# Patient Record
Sex: Female | Born: 1955
Health system: Southern US, Community
[De-identification: ages and names within clinical notes are randomized; demographics above are authoritative.]

## PROBLEM LIST (undated history)

## (undated) DIAGNOSIS — M8580 Other specified disorders of bone density and structure, unspecified site: Secondary | ICD-10-CM

## (undated) DIAGNOSIS — E288 Other ovarian dysfunction: Secondary | ICD-10-CM

## (undated) DIAGNOSIS — E079 Disorder of thyroid, unspecified: Secondary | ICD-10-CM

## (undated) DIAGNOSIS — B009 Herpesviral infection, unspecified: Secondary | ICD-10-CM

## (undated) DIAGNOSIS — I776 Arteritis, unspecified: Secondary | ICD-10-CM

## (undated) DIAGNOSIS — E2839 Other primary ovarian failure: Secondary | ICD-10-CM

## (undated) DIAGNOSIS — M3500 Sicca syndrome, unspecified: Secondary | ICD-10-CM

## (undated) HISTORY — DX: Other specified disorders of bone density and structure, unspecified site: M85.80

## (undated) HISTORY — PX: TUBAL LIGATION: SHX77

## (undated) HISTORY — DX: Other ovarian dysfunction: E28.8

## (undated) HISTORY — DX: Herpesviral infection, unspecified: B00.9

## (undated) HISTORY — DX: Disorder of thyroid, unspecified: E07.9

## (undated) HISTORY — DX: Arteritis, unspecified: I77.6

## (undated) HISTORY — DX: Sjogren syndrome, unspecified: M35.00

## (undated) HISTORY — DX: Other primary ovarian failure: E28.39

---

## 2000-03-13 ENCOUNTER — Encounter: Admission: RE | Admit: 2000-03-13 | Discharge: 2000-03-13 | Payer: Self-pay | Admitting: Gynecology

## 2000-03-13 ENCOUNTER — Encounter: Payer: Self-pay | Admitting: Gynecology

## 2000-09-14 ENCOUNTER — Encounter: Admission: RE | Admit: 2000-09-14 | Discharge: 2000-09-14 | Payer: Self-pay | Admitting: Gynecology

## 2000-09-14 ENCOUNTER — Encounter: Payer: Self-pay | Admitting: Gynecology

## 2001-05-17 ENCOUNTER — Encounter: Payer: Self-pay | Admitting: Gynecology

## 2001-05-17 ENCOUNTER — Encounter: Admission: RE | Admit: 2001-05-17 | Discharge: 2001-05-17 | Payer: Self-pay | Admitting: Gynecology

## 2001-05-27 ENCOUNTER — Other Ambulatory Visit: Admission: RE | Admit: 2001-05-27 | Discharge: 2001-05-27 | Payer: Self-pay | Admitting: Gynecology

## 2002-06-04 ENCOUNTER — Other Ambulatory Visit: Admission: RE | Admit: 2002-06-04 | Discharge: 2002-06-04 | Payer: Self-pay | Admitting: Gynecology

## 2003-06-10 ENCOUNTER — Other Ambulatory Visit: Admission: RE | Admit: 2003-06-10 | Discharge: 2003-06-10 | Payer: Self-pay | Admitting: Gynecology

## 2003-07-21 ENCOUNTER — Ambulatory Visit (HOSPITAL_COMMUNITY): Admission: RE | Admit: 2003-07-21 | Discharge: 2003-07-21 | Payer: Self-pay | Admitting: Rheumatology

## 2004-07-07 ENCOUNTER — Other Ambulatory Visit: Admission: RE | Admit: 2004-07-07 | Discharge: 2004-07-07 | Payer: Self-pay | Admitting: Gynecology

## 2005-08-17 ENCOUNTER — Other Ambulatory Visit: Admission: RE | Admit: 2005-08-17 | Discharge: 2005-08-17 | Payer: Self-pay | Admitting: Gynecology

## 2006-08-22 ENCOUNTER — Other Ambulatory Visit: Admission: RE | Admit: 2006-08-22 | Discharge: 2006-08-22 | Payer: Self-pay | Admitting: Gynecology

## 2006-08-23 ENCOUNTER — Encounter: Admission: RE | Admit: 2006-08-23 | Discharge: 2006-08-23 | Payer: Self-pay | Admitting: Gynecology

## 2007-02-21 ENCOUNTER — Ambulatory Visit (HOSPITAL_COMMUNITY): Admission: RE | Admit: 2007-02-21 | Discharge: 2007-02-21 | Payer: Self-pay | Admitting: Gastroenterology

## 2009-04-21 ENCOUNTER — Encounter: Payer: Self-pay | Admitting: Rheumatology

## 2010-05-24 NOTE — Op Note (Signed)
NAMEBRITNE, Jennifer Barr                 ACCOUNT NO.:  [id number]   MEDICAL RECORD NO.:  [id number]          PATIENT TYPE:  AMB   LOCATION:  ENDO                         FACILITY:  [facility]   PHYSICIAN:  Bernette Redbird, M.D.   DATE OF BIRTH:  07/26/1955   DATE OF PROCEDURE:  02/21/2007  DATE OF DISCHARGE:                               OPERATIVE REPORT   PROCEDURE:  Colonoscopy.   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   FINDINGS:  Normal exam to the terminal ileum.   PROCEDURE:  The procedure had been discussed with the patient through  our open access program and the purpose and risks of the procedure were  reviewed with the patient by me immediately prior to her exam, with her  having provided written consent.  Sedation was fentanyl 100 mcg and  Versed 10 mg IV prior to and during the course the procedure without  arrhythmias or desaturation.   The Pentax pediatric video colonoscope was readily advanced to the  terminal ileum and pullback was then performed. The quality of the prep  was excellent and it is felt that all areas were well seen.   The TI looked normal as did the remainder of the colon.  No polyps,  masses, diverticular disease, colitis or vascular ectasia were observed  and retroflexion in the rectum was unremarkable.  No biopsies were  obtained.  She tolerated the procedure well and there were no apparent  complications.   IMPRESSION:  Normal colonoscopy.   PLAN:  Repeat colonoscopy in 10 years or sooner if risk factors develop.           ______________________________  Bernette Redbird, M.D.     RB/MEDQ  D:  02/21/2007  T:  02/22/2007  Job:  [id number]   cc:   Gretta Cool, M.D.  Fax: [phone]

## 2010-10-03 ENCOUNTER — Ambulatory Visit
Admission: RE | Admit: 2010-10-03 | Discharge: 2010-10-03 | Disposition: A | Discharge status: Home / Self Care | Payer: Self-pay | Source: Ambulatory Visit | Attending: Gynecology | Admitting: Gynecology

## 2010-10-03 ENCOUNTER — Other Ambulatory Visit: Payer: Self-pay | Admitting: Gynecology

## 2010-10-03 DIAGNOSIS — Z1231 Encounter for screening mammogram for malignant neoplasm of breast: Secondary | ICD-10-CM

## 2013-01-07 ENCOUNTER — Encounter: Payer: Self-pay | Admitting: Family Medicine

## 2013-01-07 ENCOUNTER — Ambulatory Visit (INDEPENDENT_AMBULATORY_CARE_PROVIDER_SITE_OTHER): Payer: 59 | Admitting: Family Medicine

## 2013-01-07 VITALS — BP 110/70 | HR 81 | Temp 98.7°F | Resp 18 | Ht 68.5 in | Wt 156.0 lb

## 2013-01-07 DIAGNOSIS — J040 Acute laryngitis: Secondary | ICD-10-CM

## 2013-01-07 DIAGNOSIS — H109 Unspecified conjunctivitis: Secondary | ICD-10-CM

## 2013-01-07 DIAGNOSIS — J40 Bronchitis, not specified as acute or chronic: Secondary | ICD-10-CM

## 2013-01-07 DIAGNOSIS — D230 Other benign neoplasm of skin of lip: Secondary | ICD-10-CM

## 2013-01-07 DIAGNOSIS — J019 Acute sinusitis, unspecified: Secondary | ICD-10-CM

## 2013-01-07 DIAGNOSIS — D220 Melanocytic nevi of lip: Secondary | ICD-10-CM

## 2013-01-07 MED ORDER — GUAIFENESIN-CODEINE 100-10 MG/5ML PO SOLN: 5.0000 mL | Freq: Four times a day (QID) | ORAL | Status: DC | PRN

## 2013-01-07 MED ORDER — POLYMYXIN B-TRIMETHOPRIM 10000-0.1 UNIT/ML-% OP SOLN: 1.0000 [drp] | Freq: Four times a day (QID) | OPHTHALMIC | Status: DC

## 2013-01-07 MED ORDER — AMOXICILLIN 500 MG PO CAPS: 500.0000 mg | ORAL_CAPSULE | Freq: Three times a day (TID) | ORAL | Status: DC

## 2013-01-07 NOTE — Assessment & Plan Note (Signed)
She has laryngitis secondary to the sinusitis and drainage. Supportive care for the laryngitis.

## 2013-01-07 NOTE — Assessment & Plan Note (Signed)
Robitussin with codeine as well as antibiotic

## 2013-01-07 NOTE — Assessment & Plan Note (Signed)
Due to her stroke in send autoimmune disorder she is describing more infectious process in the eyes. I will cover her with Polytrim

## 2013-01-07 NOTE — Patient Instructions (Signed)
Use eye drop as directed Start antibiotics Push fluids Take cough medicine as prescribed Antibiotic ointment for nose Referral to dermatology for lip mole Release of records- Dr. Corliss Skains F/U as needed

## 2013-01-07 NOTE — Assessment & Plan Note (Addendum)
Antibiotics, nasal saline. She can apply bacitracin to the nasal sore  Do to her on an immune state her illness has been prolonged period I will he could fill out FMLA paperwork this is needed for her job.

## 2013-01-07 NOTE — Assessment & Plan Note (Signed)
Referral to dermatology to have this removed.

## 2013-01-07 NOTE — Progress Notes (Signed)
   Subjective:    Patient ID: Jennifer Barr, female    DOB: [birth year], 56 y.o.   MRN: [id number]  HPI Patient here with cough with minimal production sinus drainage ear pressure and laryngitis for the past 10 days. She is history of autoimmune disease including  sjrogen's and vasculitis. She also has history of Hashimoto thyroiditis. She is on immunosuppressive agents. She's had some fever with the above symptoms which has been worsening over the past week and a half. She has been using over-the-counter Robitussin and NyQuil with minimal relief.  The past 3 days she has had a yellow discharge from both eyes throughout the day. She also gets crusting. She does use artificial tears on a regular basis because of her dry eye.  She's also had some leaking with continuous cough over the past week. She recently had a GYN exam and was not told that she had a bladder prolapse. She also coughed and felt like she pulled a muscle on her left lower back. She's been using a heating pad and taking Aleve as needed.  She also noted a mole that pop up on her lower lip over the past year it has been changing some and she would like to have this removed    Review of Systems   GEN- denies fatigue, fever, weight loss,weakness, recent illness HEENT- denies eye drainage, change in vision,+ nasal discharge, CVS- denies chest pain, palpitations RESP- denies SOB, +cough, wheeze ABD- denies N/V, change in stools, abd pain GU- denies dysuria, hematuria, dribbling, incontinence MSK- + joint pain, muscle aches, injury Neuro- denies headache, dizziness, syncope, seizure activity      Objective:   Physical Exam  GEN- NAD, alert and oriented x3, hoarse voice, non toxic appearing HEENT- PERRL, EOMI, non injected sclera, injected left  conjunctiva, MMM, oropharynx mild injection, TM clear bilat no effusion,  + mild maxillary sinus tenderness, + Nasal drainage  Neck- Supple, shotty LAD CVS- RRR, no murmur RESP-CTAB EXT-  No edema Pulses- Radial 2+ Skin- Lower lip- brown macule center of lip, left nares- small sore, with mild erythema        Assessment & Plan:

## 2014-02-06 ENCOUNTER — Other Ambulatory Visit: Payer: Self-pay

## 2014-02-06 ENCOUNTER — Ambulatory Visit
Admission: RE | Admit: 2014-02-06 | Discharge: 2014-02-06 | Disposition: A | Discharge status: Home / Self Care | Payer: Commercial Managed Care - PPO | Source: Ambulatory Visit

## 2014-02-06 DIAGNOSIS — Z1231 Encounter for screening mammogram for malignant neoplasm of breast: Secondary | ICD-10-CM

## 2014-05-07 ENCOUNTER — Ambulatory Visit (INDEPENDENT_AMBULATORY_CARE_PROVIDER_SITE_OTHER): Payer: 59 | Admitting: Physician Assistant

## 2014-05-07 ENCOUNTER — Encounter: Payer: Self-pay | Admitting: Physician Assistant

## 2014-05-07 VITALS — BP 98/60 | HR 82 | Temp 97.6°F | Resp 20 | Ht 68.5 in | Wt 152.0 lb

## 2014-05-07 DIAGNOSIS — R05 Cough: Secondary | ICD-10-CM

## 2014-05-07 DIAGNOSIS — M3500 Sicca syndrome, unspecified: Secondary | ICD-10-CM

## 2014-05-07 DIAGNOSIS — R5081 Fever presenting with conditions classified elsewhere: Secondary | ICD-10-CM | POA: Diagnosis not present

## 2014-05-07 DIAGNOSIS — E063 Autoimmune thyroiditis: Secondary | ICD-10-CM | POA: Diagnosis not present

## 2014-05-07 DIAGNOSIS — R059 Cough, unspecified: Secondary | ICD-10-CM

## 2014-05-07 DIAGNOSIS — I776 Arteritis, unspecified: Secondary | ICD-10-CM | POA: Insufficient documentation

## 2014-05-07 LAB — COMPLETE METABOLIC PANEL WITH GFR
ALBUMIN: 4.2 g/dL (ref 3.5–5.2)
ALT: 12 U/L (ref 0–35)
AST: 54 U/L — ABNORMAL HIGH (ref 0–37)
Alkaline Phosphatase: 58 U/L (ref 39–117)
BUN: 12 mg/dL (ref 6–23)
CHLORIDE: 93 meq/L — AB (ref 96–112)
CO2: 24 mEq/L (ref 19–32)
Calcium: 8.8 mg/dL (ref 8.4–10.5)
Creat: 0.97 mg/dL (ref 0.50–1.10)
GFR, Est African American: 74 mL/min
GFR, Est Non African American: 65 mL/min
GLUCOSE: 99 mg/dL (ref 70–99)
POTASSIUM: 4.2 meq/L (ref 3.5–5.3)
Sodium: 132 mEq/L — ABNORMAL LOW (ref 135–145)
TOTAL PROTEIN: 7.8 g/dL (ref 6.0–8.3)
Total Bilirubin: 0.6 mg/dL (ref 0.2–1.2)

## 2014-05-07 LAB — CBC WITH DIFFERENTIAL/PLATELET
BASOS PCT: 0 % (ref 0–1)
Basophils Absolute: 0 10*3/uL (ref 0.0–0.1)
EOS PCT: 0 % (ref 0–5)
Eosinophils Absolute: 0 10*3/uL (ref 0.0–0.7)
HEMATOCRIT: 40.8 % (ref 36.0–46.0)
Hemoglobin: 13.9 g/dL (ref 12.0–15.0)
LYMPHS PCT: 13 % (ref 12–46)
Lymphs Abs: 0.5 10*3/uL — ABNORMAL LOW (ref 0.7–4.0)
MCH: 29 pg (ref 26.0–34.0)
MCHC: 34.1 g/dL (ref 30.0–36.0)
MCV: 85.2 fL (ref 78.0–100.0)
MONO ABS: 0.4 10*3/uL (ref 0.1–1.0)
MONOS PCT: 10 % (ref 3–12)
MPV: 9.6 fL (ref 8.6–12.4)
NEUTROS ABS: 3.2 10*3/uL (ref 1.7–7.7)
Neutrophils Relative %: 77 % (ref 43–77)
Platelets: 222 10*3/uL (ref 150–400)
RBC: 4.79 MIL/uL (ref 3.87–5.11)
RDW: 13.5 % (ref 11.5–15.5)
WBC: 4.2 10*3/uL (ref 4.0–10.5)

## 2014-05-07 MED ORDER — AZITHROMYCIN 250 MG PO TABS: ORAL_TABLET | ORAL | Status: DC

## 2014-05-07 NOTE — Progress Notes (Signed)
Patient ID: GWENDALYN MCGONAGLE MRN: [id number], DOB: December 04, 1955, 59 y.o. Date of Encounter: @DATE @  Chief Complaint:  Chief Complaint  Patient presents with  . OTHER    fever x 5day,cough, nauseated, no vomiting, has not been urinating much,fatique    HPI: 59 y.o. year old white female  presents with the above symptoms. She is accompanied by her daughter who is an Therapist, sports in the cardiac ICU at [employer].  Patient has significant history for Sjogren's syndrome, lower extremity vasculitis, and Hashimoto thyroiditis.  Daughter has pictures on her phone that she shows me from 04/25/14 of "vasculitis flare "on patient's legs. As well they state that she has been on current immunosuppressants and medications for 10 years. On Imuran and Plaquenil.  Pt states that she has had fever for 5 days. Says fever was 103 then 102 then 101 and then this morning fever was decreased/resolved. States that she has also had a congested productive cough through this same amount of time. States that she has had no congestion in her head and nose and is not blowing anything from her nose. No sore throat or ear ache. No abdominal pain nausea vomiting diarrhea.   Past Medical History  Diagnosis Date  . Sjoegren syndrome     Dr. Estanislado Pandy  . Vasculitis     Dr. Estanislado Pandy  . Thyroid disease     Hashiomto  . Premature ovarian failure      Home Meds: Outpatient Prescriptions Prior to Visit  Medication Sig Dispense Refill  . aspirin 81 MG tablet Take 81 mg by mouth daily.    Marland Kitchen azaTHIOprine (IMURAN) 50 MG tablet Take 50 mg by mouth daily.    . hydroxychloroquine (PLAQUENIL) 200 MG tablet Take by mouth daily.    Marland Kitchen levothyroxine (SYNTHROID, LEVOTHROID) 100 MCG tablet Take 100 mcg by mouth daily before breakfast.    . pilocarpine (SALAGEN) 5 MG tablet Take by mouth.    . Progesterone 200 MG CAPS Take by mouth. Take every 3rd month    . trimethoprim-polymyxin b (POLYTRIM) ophthalmic solution Place 1 drop into both eyes  every 6 (six) hours. FOR 5 DAYS 10 mL 0  . amoxicillin (AMOXIL) 500 MG capsule Take 1 capsule (500 mg total) by mouth 3 (three) times daily. (Patient not taking: Reported on 05/07/2014) 30 capsule 0  . guaiFENesin-codeine 100-10 MG/5ML syrup Take 5 mLs by mouth every 6 (six) hours as needed for cough. (Patient not taking: Reported on 05/07/2014) 180 mL 0   No facility-administered medications prior to visit.    Allergies:  Allergies  Allergen Reactions  . Sulfa Antibiotics     History   Social History  . Marital Status: Married    Spouse Name: N/A  . Number of Children: N/A  . Years of Education: N/A   Occupational History  . Not on file.   Social History Main Topics  . Smoking status: Never Smoker   . Smokeless tobacco: Not on file  . Alcohol Use: Not on file  . Drug Use: No  . Sexual Activity: Not on file   Other Topics Concern  . Not on file   Social History Narrative    Family History  Problem Relation Age of Onset  . Diabetes Mother   . Diabetes Father      Review of Systems:  See HPI for pertinent ROS. All other ROS negative.    Physical Exam: Blood pressure 98/60, pulse 82, temperature 97.6 F (36.4 C), temperature source Oral,  resp. rate 20, height 5' 8.5" (1.74 m), weight 152 lb (68.947 kg)., Body mass index is 22.77 kg/(m^2). General: WNWD WF. Appears in no acute distress. Head: Normocephalic, atraumatic, eyes without discharge, sclera non-icteric, nares are without discharge. Bilateral auditory canals clear, TM's are without perforation, pearly grey and translucent with reflective cone of light bilaterally. Oral cavity moist, posterior pharynx without exudate, erythema, peritonsillar abscess, or post nasal drip.  Neck: Supple. No thyromegaly. No lymphadenopathy. Lungs: Clear bilaterally to auscultation without wheezes, rales, or rhonchi. Breathing is unlabored. Heart: RRR with S1 S2. No murmurs, rubs, or gallops. Musculoskeletal:  Strength and tone  normal for age. No LE Edema. Extremities/Skin: Warm and dry. No edema.  Neuro: Alert and oriented X 3. Moves all extremities spontaneously. Gait is normal. CNII-XII grossly in tact. Psych:  Responds to questions appropriately with a normal affect.     ASSESSMENT AND PLAN:  59 y.o. year old female with  1. Fever presenting with conditions classified elsewhere - CBC with Differential/Platelet - azithromycin (ZITHROMAX) 250 MG tablet; Day 1: Take 2 daily. Days 2-5: Take 1 daily.  Dispense: 6 tablet; Refill: 0  2. Cough - CBC with Differential/Platelet - azithromycin (ZITHROMAX) 250 MG tablet; Day 1: Take 2 daily. Days 2-5: Take 1 daily.  Dispense: 6 tablet; Refill: 0  3. Sjoegren syndrome - CBC with Differential/Platelet  4. Vasculitis - CBC with Differential/Platelet - COMPLETE METABOLIC PANEL WITH GFR  5. Hashimoto's thyroiditis - CBC with Differential/Platelet  Reviewed with patient and daughter that I do not have copy of labs by Dr. Estanislado Pandy. Patient states that she knows that she has elevated LFTs and low WBC. Patient also states that she wants to check to make sure that the vasculitis flare did not affect her kidneys. Therefore will check above labs. Will treat with azithromycin as directed. Follow-up if symptoms worsen or do not resolve within 1 week of completion of antibiotic.  [address] [place], [place], [facility] 05/07/2014 9:44 AM

## 2015-01-14 MED FILL — HYDROXYCHLOROQUINE 200 MG T: 200 | 90 days supply | Qty: 90 | Fill #3

## 2015-01-14 MED FILL — PILOCARPINE HCL 5 MG TABLET: 5 | 90 days supply | Qty: 270 | Fill #1

## 2015-02-05 DIAGNOSIS — Z79899 Other long term (current) drug therapy: Secondary | ICD-10-CM | POA: Diagnosis not present

## 2015-02-18 DIAGNOSIS — M3509 Sicca syndrome with other organ involvement: Secondary | ICD-10-CM | POA: Diagnosis not present

## 2015-02-18 DIAGNOSIS — Z09 Encounter for follow-up examination after completed treatment for conditions other than malignant neoplasm: Secondary | ICD-10-CM | POA: Diagnosis not present

## 2015-02-18 DIAGNOSIS — R21 Rash and other nonspecific skin eruption: Secondary | ICD-10-CM | POA: Diagnosis not present

## 2015-02-18 DIAGNOSIS — M19241 Secondary osteoarthritis, right hand: Secondary | ICD-10-CM | POA: Diagnosis not present

## 2015-03-05 ENCOUNTER — Encounter: Payer: Self-pay | Admitting: Obstetrics & Gynecology

## 2015-03-05 DIAGNOSIS — Z0000 Encounter for general adult medical examination without abnormal findings: Secondary | ICD-10-CM | POA: Diagnosis not present

## 2015-03-05 DIAGNOSIS — Z1329 Encounter for screening for other suspected endocrine disorder: Secondary | ICD-10-CM | POA: Diagnosis not present

## 2015-03-05 DIAGNOSIS — Z1151 Encounter for screening for human papillomavirus (HPV): Secondary | ICD-10-CM | POA: Diagnosis not present

## 2015-03-05 DIAGNOSIS — Z13220 Encounter for screening for lipoid disorders: Secondary | ICD-10-CM | POA: Diagnosis not present

## 2015-03-05 DIAGNOSIS — Z1389 Encounter for screening for other disorder: Secondary | ICD-10-CM | POA: Diagnosis not present

## 2015-03-05 DIAGNOSIS — Z01419 Encounter for gynecological examination (general) (routine) without abnormal findings: Secondary | ICD-10-CM | POA: Diagnosis not present

## 2015-03-05 DIAGNOSIS — Z130 Encounter for screening for diseases of the blood and blood-forming organs and certain disorders involving the immune mechanism: Secondary | ICD-10-CM | POA: Diagnosis not present

## 2015-03-05 MED FILL — ESTRADIOL 0.025 MG PATCH: 0.025 | 84 days supply | Qty: 24 | Fill #0

## 2015-03-05 MED FILL — PROGESTERONE 200 MG CAPSULE: 200 | 90 days supply | Qty: 30 | Fill #0

## 2015-03-08 MED FILL — azaTHIOprine 50 MG TABS: 50 | 90 days supply | Qty: 135 | Fill #0

## 2015-03-12 DIAGNOSIS — H524 Presbyopia: Secondary | ICD-10-CM | POA: Diagnosis not present

## 2015-03-12 DIAGNOSIS — H5213 Myopia, bilateral: Secondary | ICD-10-CM | POA: Diagnosis not present

## 2015-03-12 DIAGNOSIS — Z79899 Other long term (current) drug therapy: Secondary | ICD-10-CM | POA: Diagnosis not present

## 2015-03-12 DIAGNOSIS — H52223 Regular astigmatism, bilateral: Secondary | ICD-10-CM | POA: Diagnosis not present

## 2015-03-12 MED FILL — SYNTHROID 100 MCG TABLET: 100 | 90 days supply | Qty: 90 | Fill #0

## 2015-04-05 MED FILL — HYDROXYCHLOROQUINE 200 MG T: 200 | 90 days supply | Qty: 90 | Fill #0

## 2015-05-06 DIAGNOSIS — Z79899 Other long term (current) drug therapy: Secondary | ICD-10-CM | POA: Diagnosis not present

## 2015-06-08 MED FILL — SYNTHROID 100 MCG TABLET: 100 | 90 days supply | Qty: 90 | Fill #1

## 2015-06-08 MED FILL — azaTHIOprine 50 MG TABS: 50 | 90 days supply | Qty: 135 | Fill #0

## 2015-06-10 DIAGNOSIS — Z79899 Other long term (current) drug therapy: Secondary | ICD-10-CM | POA: Diagnosis not present

## 2015-07-02 MED FILL — HYDROXYCHLOROQUINE 200 MG T: 200 | 90 days supply | Qty: 90 | Fill #1

## 2015-07-02 MED FILL — PROGESTERONE 200 MG CAPSULE: 200 | 90 days supply | Qty: 30 | Fill #1

## 2015-08-05 DIAGNOSIS — M2550 Pain in unspecified joint: Secondary | ICD-10-CM | POA: Diagnosis not present

## 2015-08-05 DIAGNOSIS — R300 Dysuria: Secondary | ICD-10-CM | POA: Diagnosis not present

## 2015-08-05 DIAGNOSIS — Z79899 Other long term (current) drug therapy: Secondary | ICD-10-CM | POA: Diagnosis not present

## 2015-08-19 DIAGNOSIS — M3509 Sicca syndrome with other organ involvement: Secondary | ICD-10-CM | POA: Diagnosis not present

## 2015-08-19 DIAGNOSIS — M359 Systemic involvement of connective tissue, unspecified: Secondary | ICD-10-CM | POA: Diagnosis not present

## 2015-08-19 DIAGNOSIS — Z09 Encounter for follow-up examination after completed treatment for conditions other than malignant neoplasm: Secondary | ICD-10-CM | POA: Diagnosis not present

## 2015-08-19 DIAGNOSIS — M19241 Secondary osteoarthritis, right hand: Secondary | ICD-10-CM | POA: Diagnosis not present

## 2015-09-03 MED FILL — SYNTHROID 100 MCG TABLET: 100 | 90 days supply | Qty: 90 | Fill #2

## 2015-09-08 MED FILL — azaTHIOprine 50 MG TABS: 50 | 90 days supply | Qty: 135 | Fill #0

## 2015-10-11 MED FILL — HYDROXYCHLOROQUINE 200 MG T: 200 | 90 days supply | Qty: 90 | Fill #0

## 2015-11-12 ENCOUNTER — Other Ambulatory Visit: Payer: Self-pay | Admitting: Rheumatology

## 2015-11-12 DIAGNOSIS — Z79899 Other long term (current) drug therapy: Secondary | ICD-10-CM | POA: Diagnosis not present

## 2015-11-12 LAB — CBC WITH DIFFERENTIAL/PLATELET
BASOS ABS: 0 {cells}/uL (ref 0–200)
Basophils Relative: 0 %
EOS PCT: 3 %
Eosinophils Absolute: 144 cells/uL (ref 15–500)
HCT: 40.9 % (ref 35.0–45.0)
Hemoglobin: 13.6 g/dL (ref 11.7–15.5)
Lymphocytes Relative: 13 %
Lymphs Abs: 624 cells/uL — ABNORMAL LOW (ref 850–3900)
MCH: 28.7 pg (ref 27.0–33.0)
MCHC: 33.3 g/dL (ref 32.0–36.0)
MCV: 86.3 fL (ref 80.0–100.0)
MONOS PCT: 9 %
MPV: 9.4 fL (ref 7.5–12.5)
Monocytes Absolute: 432 cells/uL (ref 200–950)
NEUTROS PCT: 75 %
Neutro Abs: 3600 cells/uL (ref 1500–7800)
PLATELETS: 236 10*3/uL (ref 140–400)
RBC: 4.74 MIL/uL (ref 3.80–5.10)
RDW: 13.6 % (ref 11.0–15.0)
WBC: 4.8 10*3/uL (ref 3.8–10.8)

## 2015-11-13 LAB — COMPLETE METABOLIC PANEL WITH GFR
ALBUMIN: 4.2 g/dL (ref 3.6–5.1)
ALK PHOS: 60 U/L (ref 33–130)
ALT: 9 U/L (ref 6–29)
AST: 35 U/L (ref 10–35)
BILIRUBIN TOTAL: 0.8 mg/dL (ref 0.2–1.2)
BUN: 14 mg/dL (ref 7–25)
CO2: 23 mmol/L (ref 20–31)
Calcium: 9.3 mg/dL (ref 8.6–10.4)
Chloride: 101 mmol/L (ref 98–110)
Creat: 0.97 mg/dL (ref 0.50–1.05)
GFR, EST NON AFRICAN AMERICAN: 64 mL/min (ref >=60)
GFR, Est African American: 74 mL/min (ref >=60)
GLUCOSE: 75 mg/dL (ref 65–99)
Potassium: 4 mmol/L (ref 3.5–5.3)
SODIUM: 135 mmol/L (ref 135–146)
TOTAL PROTEIN: 7.8 g/dL (ref 6.1–8.1)

## 2015-12-03 ENCOUNTER — Other Ambulatory Visit: Payer: Self-pay | Admitting: Rheumatology

## 2015-12-03 MED FILL — SYNTHROID 100 MCG TABLET: 100 | 90 days supply | Qty: 90 | Fill #3

## 2015-12-06 MED FILL — PILOCARPINE HCL 5 MG TABLET: 5 | 90 days supply | Qty: 270 | Fill #0

## 2015-12-06 MED FILL — azaTHIOprine 50 MG TABS: 50 | 90 days supply | Qty: 135 | Fill #0

## 2015-12-06 NOTE — Telephone Encounter (Signed)
08/19/15 last visit 02/22/16 next visit Labs WNL 11/12/15 Ok to refill per Dr Estanislado Pandy

## 2016-01-11 MED FILL — HYDROXYCHLOROQUINE 200 MG T: 200 | 90 days supply | Qty: 90 | Fill #1

## 2016-01-25 ENCOUNTER — Ambulatory Visit (INDEPENDENT_AMBULATORY_CARE_PROVIDER_SITE_OTHER): Payer: 59 | Admitting: Family Medicine

## 2016-01-25 ENCOUNTER — Encounter: Payer: Self-pay | Admitting: Family Medicine

## 2016-01-25 VITALS — BP 132/68 | HR 82 | Temp 99.1°F | Resp 18 | Ht 68.5 in | Wt 157.0 lb

## 2016-01-25 DIAGNOSIS — J0100 Acute maxillary sinusitis, unspecified: Secondary | ICD-10-CM

## 2016-01-25 DIAGNOSIS — J040 Acute laryngitis: Secondary | ICD-10-CM | POA: Diagnosis not present

## 2016-01-25 MED ORDER — AMOXICILLIN-POT CLAVULANATE 875-125 MG PO TABS: 1.0000 | ORAL_TABLET | Freq: Two times a day (BID) | ORAL | 0 refills | Status: DC

## 2016-01-25 MED FILL — AMOX-CLAV 875-125 MG TABLET: 875-125 | 10 days supply | Qty: 20 | Fill #0

## 2016-01-25 NOTE — Progress Notes (Signed)
   Subjective:    Patient ID: Jennifer Barr, female    DOB: June 09, 1955, 61 y.o.   MRN: [id number]  Patient presents for Illness (x3 days- productive cough, hoarse, laryngitis, nasal congestion )  Pt  here with sinus pressure drainage mild cough for the past 3 days. She also has a hoarse voice. She typically gets like this when she gets ill. She is immunocompromised with estrogen syndrome and history of Hashimoto's. She's had low-grade fever. Multiple sick contacts at work. She has had her flu shot and her pneumonia vaccine   Review Of Systems:  GEN- denies fatigue,+ fever, weight loss,weakness, recent illness HEENT- denies eye drainage, change in vision, +nasal discharge, CVS- denies chest pain, palpitations RESP- denies SOB, +cough, wheeze ABD- denies N/V, change in stools, abd pain GU- denies dysuria, hematuria, dribbling, incontinence MSK- denies joint pain, muscle aches, injury Neuro- denies headache, dizziness, syncope, seizure activity       Objective:    BP 132/68 (BP Location: Left Arm, Patient Position: Sitting, Cuff Size: Normal)   Pulse 82   Temp 99.1 F (37.3 C) (Oral)   Resp 18   Ht 5' 8.5" (1.74 m)   Wt 157 lb (71.2 kg)   SpO2 98%   BMI 23.52 kg/m  GEN- NAD, alert and oriented x3 HEENT- PERRL, EOMI, non injected sclera, pink conjunctiva, MMM, oropharynx mild injection, TM clear bilat no effusion,  No  maxillary sinus tenderness, inflammed turbinates,  Nasal drainage , hoarse voice  Neck- Supple, + cervical  LAD CVS- RRR, no murmur RESP-CTAB EXT- No edema Pulses- Radial 2+          Assessment & Plan:      Problem List Items Addressed This Visit    Laryngitis   Acute sinusitis - Primary    early sinusitis, based on history , immunocompromised start augmentin, throat lozenges, robitussun for cough, nasal saline       Relevant Medications   amoxicillin-clavulanate (AUGMENTIN) 875-125 MG tablet      Note: This dictation was prepared with Dragon  dictation along with smaller phrase technology. Any transcriptional errors that result from this process are unintentional.

## 2016-01-25 NOTE — Patient Instructions (Signed)
Take antibiotics Robitussin Nasal saline F/U as needed

## 2016-01-25 NOTE — Assessment & Plan Note (Signed)
early sinusitis, based on history , immunocompromised start augmentin, throat lozenges, robitussun for cough, nasal saline

## 2016-02-07 ENCOUNTER — Other Ambulatory Visit: Payer: Self-pay | Admitting: Rheumatology

## 2016-02-07 DIAGNOSIS — Z79899 Other long term (current) drug therapy: Secondary | ICD-10-CM | POA: Diagnosis not present

## 2016-02-07 LAB — CBC WITH DIFFERENTIAL/PLATELET
BASOS PCT: 1 %
Basophils Absolute: 39 cells/uL (ref 0–200)
EOS ABS: 78 {cells}/uL (ref 15–500)
Eosinophils Relative: 2 %
HCT: 36.9 % (ref 35.0–45.0)
Hemoglobin: 12.2 g/dL (ref 11.7–15.5)
LYMPHS PCT: 16 %
Lymphs Abs: 624 cells/uL — ABNORMAL LOW (ref 850–3900)
MCH: 28.8 pg (ref 27.0–33.0)
MCHC: 33.1 g/dL (ref 32.0–36.0)
MCV: 87.2 fL (ref 80.0–100.0)
MONO ABS: 312 {cells}/uL (ref 200–950)
MPV: 9.2 fL (ref 7.5–12.5)
Monocytes Relative: 8 %
Neutro Abs: 2847 cells/uL (ref 1500–7800)
Neutrophils Relative %: 73 %
Platelets: 254 10*3/uL (ref 140–400)
RBC: 4.23 MIL/uL (ref 3.80–5.10)
RDW: 13.5 % (ref 11.0–15.0)
WBC: 3.9 10*3/uL (ref 3.8–10.8)

## 2016-02-07 LAB — COMPLETE METABOLIC PANEL WITH GFR
ALT: 10 U/L (ref 6–29)
AST: 41 U/L — AB (ref 10–35)
Albumin: 4.2 g/dL (ref 3.6–5.1)
Alkaline Phosphatase: 64 U/L (ref 33–130)
BUN: 14 mg/dL (ref 7–25)
CHLORIDE: 105 mmol/L (ref 98–110)
CO2: 25 mmol/L (ref 20–31)
CREATININE: 0.84 mg/dL (ref 0.50–1.05)
Calcium: 9.2 mg/dL (ref 8.6–10.4)
GFR, EST AFRICAN AMERICAN: 88 mL/min (ref >=60)
GFR, Est Non African American: 76 mL/min (ref >=60)
Glucose, Bld: 90 mg/dL (ref 65–99)
POTASSIUM: 4.3 mmol/L (ref 3.5–5.3)
Sodium: 137 mmol/L (ref 135–146)
Total Bilirubin: 0.6 mg/dL (ref 0.2–1.2)
Total Protein: 7.8 g/dL (ref 6.1–8.1)

## 2016-02-07 NOTE — Progress Notes (Signed)
Labs stable

## 2016-02-10 ENCOUNTER — Ambulatory Visit (INDEPENDENT_AMBULATORY_CARE_PROVIDER_SITE_OTHER): Payer: 59 | Admitting: Rheumatology

## 2016-02-10 ENCOUNTER — Ambulatory Visit: Payer: 59 | Admitting: Rheumatology

## 2016-02-10 ENCOUNTER — Encounter: Payer: Self-pay | Admitting: Rheumatology

## 2016-02-10 VITALS — BP 102/58 | Resp 14 | Ht 68.5 in | Wt 155.0 lb

## 2016-02-10 DIAGNOSIS — M351 Other overlap syndromes: Secondary | ICD-10-CM

## 2016-02-10 DIAGNOSIS — M419 Scoliosis, unspecified: Secondary | ICD-10-CM | POA: Insufficient documentation

## 2016-02-10 DIAGNOSIS — I7300 Raynaud's syndrome without gangrene: Secondary | ICD-10-CM | POA: Insufficient documentation

## 2016-02-10 DIAGNOSIS — M4125 Other idiopathic scoliosis, thoracolumbar region: Secondary | ICD-10-CM | POA: Diagnosis not present

## 2016-02-10 DIAGNOSIS — M3501 Sicca syndrome with keratoconjunctivitis: Secondary | ICD-10-CM

## 2016-02-10 DIAGNOSIS — R768 Other specified abnormal immunological findings in serum: Secondary | ICD-10-CM | POA: Insufficient documentation

## 2016-02-10 DIAGNOSIS — I776 Arteritis, unspecified: Secondary | ICD-10-CM | POA: Diagnosis not present

## 2016-02-10 DIAGNOSIS — M19041 Primary osteoarthritis, right hand: Secondary | ICD-10-CM | POA: Diagnosis not present

## 2016-02-10 DIAGNOSIS — Z79899 Other long term (current) drug therapy: Secondary | ICD-10-CM | POA: Diagnosis not present

## 2016-02-10 NOTE — Progress Notes (Signed)
Office Visit Note  Patient: Jennifer Barr             Date of Birth: 06-07-1955           MRN: [id number]             PCP: Milinda Antis, MD Referring: Salley Scarlet, MD Visit Date: 02/10/2016 Occupation: @GUAROCC @    Subjective:  Follow-up Vasculitis, Sjogren's, mixed connective tissue disease, high risk prescription  History of Present Illness: Jennifer Barr is a 61 y.o. female  Last seen 08/19/2015  Patient's vasculitis is doing well. No complaint. She does not have any significant rash but she does have a few lesions in her lower extremities that are very mild/minor. She is quite pleased with her current progress and good health.  She does have mixed connective tissue disease and it is not flaring. No joint pain swelling stiffness.  She does have Sjogren's. She does have active dry eyes and dry mouth. She is wearing contact lenses that she'll her eyeballs from the dryness and getting good results with them but the contact lenses are over $200 and she needs to get new peers every 3 months. Note that they're not prescription lenses. Note that these are being used to give her comfort and her eyes because they gets so dry without the contact lenses. As a result the patient also has to wear glasses because she is in need of correction of her vision which she gets through her glasses.  Patient uses Imuran 75 mg daily and Plaquenil 200 mg daily.  She does have some joint pain especially to the right CMC joint off-and-on.  Patient also has so much problems with her eyes but Dr. 10/19/2015 has been offering her FMLA on those bad days where her eyes are so bad that she is unable to work. She will drop off FMLA forms for Corliss Skains to fill out his usual. I am agreeable. We'll review the last forms that we fill out for her and help   Activities of Daily Living:  Patient reports morning stiffness for 15 minutes.   Patient Denies nocturnal pain.  Difficulty dressing/grooming:  Denies Difficulty climbing stairs: Denies Difficulty getting out of chair: Denies Difficulty using hands for taps, buttons, cutlery, and/or writing: Denies    Review of Systems  Constitutional: Negative for fatigue.  HENT: Negative for mouth sores and mouth dryness.   Eyes: Negative for dryness.  Respiratory: Negative for shortness of breath.   Gastrointestinal: Negative for constipation and diarrhea.  Musculoskeletal: Negative for myalgias and myalgias.  Skin: Negative for sensitivity to sunlight.  Psychiatric/Behavioral: Negative for decreased concentration and sleep disturbance.    PMFS History:  Patient Active Problem List   Diagnosis Date Noted  . Mixed connective tissue disease (HCC) 02/10/2016  . ANA positive 02/10/2016  . Rheumatoid factor positive 02/10/2016  . Primary osteoarthritis, right hand 02/10/2016  . Scoliosis 02/10/2016  . Raynaud's syndrome without gangrene 02/10/2016  . Sjoegren syndrome (HCC) 05/07/2014  . Vasculitis (HCC) 05/07/2014  . Hashimoto's thyroiditis 05/07/2014  . Acute sinusitis 01/07/2013  . Bronchitis 01/07/2013  . Conjunctivitis 01/07/2013  . Nevus of skin of lip 01/07/2013  . Laryngitis 01/07/2013    Past Medical History:  Diagnosis Date  . Premature ovarian failure   . Sjoegren syndrome (HCC)    Dr. 01/09/2013  . Thyroid disease    Hashiomto  . Vasculitis (HCC)    Dr. Corliss Skains    Family History  Problem Relation Age of  Onset  . Diabetes Mother   . Diabetes Father    No past surgical history on file. Social History   Social History Narrative  . No narrative on file     Objective: Vital Signs: BP (!) 102/58   Resp 14   Ht 5' 8.5" (1.74 m)   Wt 155 lb (70.3 kg)   BMI 23.22 kg/m    Physical Exam  Constitutional: She is oriented to person, place, and time. She appears well-developed and well-nourished.  HENT:  Head: Normocephalic and atraumatic.  Eyes: EOM are normal. Pupils are equal, round, and reactive to light.   Cardiovascular: Normal rate, regular rhythm and normal heart sounds.  Exam reveals no gallop and no friction rub.   No murmur heard. Pulmonary/Chest: Effort normal and breath sounds normal. She has no wheezes. She has no rales.  Abdominal: Soft. Bowel sounds are normal. She exhibits no distension. There is no tenderness. There is no guarding. No hernia.  Musculoskeletal: Normal range of motion. She exhibits no edema, tenderness or deformity.  Lymphadenopathy:    She has no cervical adenopathy.  Neurological: She is alert and oriented to person, place, and time. Coordination normal.  Skin: Skin is warm and dry. Capillary refill takes less than 2 seconds. No rash noted.  Psychiatric: She has a normal mood and affect. Her behavior is normal.     Musculoskeletal Exam:  Full range of motion of all joints Grip strength is equal and strong bilaterally Fibromyalgia tender points are all absent  CDAI Exam: CDAI Homunculus Exam:   Joint Counts:  CDAI Tender Joint count: 0 CDAI Swollen Joint count: 0  No synovitis on exam   Investigation: Findings:  Mixed connective tissue disease with positive ANA, positive Ro and [place] antibodies, and positive rheumatoid factor.   July 2017:  CBC was normal.  Comprehensive metabolic panel showed AST of 37, UA negative, sed rate 16.  ANA was more than 1:1280, nucleolar speckled pattern, and C3 and C4 were normal.  Orders Only on 02/07/2016  Component Date Value Ref Range Status  . Sodium 02/07/2016 137  135 - 146 mmol/L Final  . Potassium 02/07/2016 4.3  3.5 - 5.3 mmol/L Final  . Chloride 02/07/2016 105  98 - 110 mmol/L Final  . CO2 02/07/2016 25  20 - 31 mmol/L Final  . Glucose, Bld 02/07/2016 90  65 - 99 mg/dL Final  . BUN 02/07/2016 14  7 - 25 mg/dL Final  . Creat 02/07/2016 0.84  0.50 - 1.05 mg/dL Final   Comment:   For patients > or = 60 years of age: The upper reference limit for Creatinine is approximately 13% higher for people identified  as African-American.     . Total Bilirubin 02/07/2016 0.6  0.2 - 1.2 mg/dL Final  . Alkaline Phosphatase 02/07/2016 64  33 - 130 U/L Final  . AST 02/07/2016 41* 10 - 35 U/L Final  . ALT 02/07/2016 10  6 - 29 U/L Final  . Total Protein 02/07/2016 7.8  6.1 - 8.1 g/dL Final  . Albumin 02/07/2016 4.2  3.6 - 5.1 g/dL Final  . Calcium 02/07/2016 9.2  8.6 - 10.4 mg/dL Final  . GFR, Est African American 02/07/2016 88  >=60 mL/min Final  . GFR, Est Non African American 02/07/2016 76  >=60 mL/min Final  . WBC 02/07/2016 3.9  3.8 - 10.8 K/uL Final  . RBC 02/07/2016 4.23  3.80 - 5.10 MIL/uL Final  . Hemoglobin 02/07/2016 12.2  11.7 -  15.5 g/dL Final  . HCT 02/07/2016 36.9  35.0 - 45.0 % Final  . MCV 02/07/2016 87.2  80.0 - 100.0 fL Final  . MCH 02/07/2016 28.8  27.0 - 33.0 pg Final  . MCHC 02/07/2016 33.1  32.0 - 36.0 g/dL Final  . RDW 02/07/2016 13.5  11.0 - 15.0 % Final  . Platelets 02/07/2016 254  140 - 400 K/uL Final  . MPV 02/07/2016 9.2  7.5 - 12.5 fL Final  . Neutro Abs 02/07/2016 2847  1,500 - 7,800 cells/uL Final  . Lymphs Abs 02/07/2016 624* 850 - 3,900 cells/uL Final  . Monocytes Absolute 02/07/2016 312  200 - 950 cells/uL Final  . Eosinophils Absolute 02/07/2016 78  15 - 500 cells/uL Final  . Basophils Absolute 02/07/2016 39  0 - 200 cells/uL Final  . Neutrophils Relative % 02/07/2016 73  % Final  . Lymphocytes Relative 02/07/2016 16  % Final  . Monocytes Relative 02/07/2016 8  % Final  . Eosinophils Relative 02/07/2016 2  % Final  . Basophils Relative 02/07/2016 1  % Final  . Smear Review 02/07/2016 Criteria for review not met   Final      Imaging: No results found.  Speciality Comments: No specialty comments available.    Procedures:  No procedures performed Allergies: Sulfa antibiotics   Assessment / Plan:     Visit Diagnoses: Vasculitis ([place])  Mixed connective tissue disease ([place]) - Plan: Urinalysis, Routine w reflex microscopic  ANA positive  Rheumatoid  factor positive  Sjogren's syndrome with keratoconjunctivitis sicca (HCC)  Primary osteoarthritis, right hand  Other idiopathic scoliosis, thoracolumbar region  Raynaud's syndrome without gangrene  High risk medication use - Plan: CBC with Differential/Platelet, COMPLETE METABOLIC PANEL WITH GFR, Urinalysis, Routine w reflex microscopic   Plan: #1: Patient is doing well with her vasculitis and mixed connective tissue disease.  She had labs done 08/05/2015 for an autoimmune workup. She'll be due again in about a year. Since she is stable, we do not need to do those labs at this time.  #2: High risk prescription. Patient is taking Imuran 75 mg daily and Plaquenil 200 mg daily. Patient has adequate amount of medicine at home at this time and does not need a refill. She knows to call us when she is requiring the medication.  #3: Patient will need CBC with differential, CMP with GFR, urinalysis in about 3 months. Orders have been placed.  #4: She'll return to clinic in about 5 months. At that time we'll place a new order for the following CBC with differential, CMP with GFR, urinalysis, sedimentation rate, ANA with titer, C3-C4.  #5:Patient also has so much problems with her eyes but Dr. Estanislado Pandy has been offering her FMLA on those bad days where her eyes are so bad that she is unable to work. She will drop off FMLA forms for [place] to fill out his usual. I am agreeable. We'll review the last forms that we fill out for her and help    Orders: Orders Placed This Encounter  Procedures  . CBC with Differential/Platelet  . COMPLETE METABOLIC PANEL WITH GFR  . Urinalysis, Routine w reflex microscopic   No orders of the defined types were placed in this encounter.   Face-to-face time spent with patient was 30 minutes. 50% of time was spent in counseling and coordination of care.  Follow-Up Instructions: Return in about 5 months (around 07/09/2016) for vasculitis; sjogrens; mctd;  [id number], plq 200 qd; Marland Kitchen   [employer],  PA-C  Note - This record has been created using [employer].  Chart creation errors have been sought, but may not always  have been located. Such creation errors do not reflect on  the standard of medical care.

## 2016-02-22 ENCOUNTER — Ambulatory Visit: Payer: 59 | Admitting: Rheumatology

## 2016-02-25 DIAGNOSIS — H524 Presbyopia: Secondary | ICD-10-CM | POA: Diagnosis not present

## 2016-02-25 DIAGNOSIS — Z79899 Other long term (current) drug therapy: Secondary | ICD-10-CM | POA: Diagnosis not present

## 2016-02-25 DIAGNOSIS — M069 Rheumatoid arthritis, unspecified: Secondary | ICD-10-CM | POA: Diagnosis not present

## 2016-02-25 DIAGNOSIS — M3501 Sicca syndrome with keratoconjunctivitis: Secondary | ICD-10-CM | POA: Diagnosis not present

## 2016-02-25 DIAGNOSIS — H5213 Myopia, bilateral: Secondary | ICD-10-CM | POA: Diagnosis not present

## 2016-02-25 DIAGNOSIS — H04123 Dry eye syndrome of bilateral lacrimal glands: Secondary | ICD-10-CM | POA: Diagnosis not present

## 2016-02-25 DIAGNOSIS — H52223 Regular astigmatism, bilateral: Secondary | ICD-10-CM | POA: Diagnosis not present

## 2016-03-01 ENCOUNTER — Other Ambulatory Visit: Payer: Self-pay | Admitting: Rheumatology

## 2016-03-01 MED FILL — SYNTHROID 100 MCG TABLET: 100 | 90 days supply | Qty: 90 | Fill #4

## 2016-03-02 MED FILL — azaTHIOprine 50 MG TABS: 50 | 90 days supply | Qty: 135 | Fill #0

## 2016-03-02 NOTE — Telephone Encounter (Signed)
Last Visit: 02/10/16 Next Visit: 07/18/16 Labs: 02/07/16 Stable  Okay to refill Imuran?

## 2016-04-10 ENCOUNTER — Other Ambulatory Visit: Payer: Self-pay | Admitting: Rheumatology

## 2016-04-11 ENCOUNTER — Encounter: Payer: 59 | Admitting: Obstetrics & Gynecology

## 2016-04-11 MED FILL — HYDROXYCHLOROQUINE 200 MG T: 200 | 90 days supply | Qty: 90 | Fill #0

## 2016-04-11 MED FILL — DENTA 5000 PLUS CREAM: 1.1 | 30 days supply | Qty: 51 | Fill #0

## 2016-04-11 NOTE — Telephone Encounter (Signed)
Last Visit: 02/10/16 Next Visit: 07/18/16 Labs: 02/07/16 Stable PLQ Eye Exam: 02/25/16 WNL  Okay to refill PLQ?

## 2016-04-18 ENCOUNTER — Encounter: Payer: Self-pay | Admitting: Obstetrics & Gynecology

## 2016-04-18 ENCOUNTER — Ambulatory Visit (INDEPENDENT_AMBULATORY_CARE_PROVIDER_SITE_OTHER): Payer: 59 | Admitting: Obstetrics & Gynecology

## 2016-04-18 VITALS — BP 128/72 | Ht 68.5 in | Wt 150.0 lb

## 2016-04-18 DIAGNOSIS — Z0000 Encounter for general adult medical examination without abnormal findings: Secondary | ICD-10-CM | POA: Diagnosis not present

## 2016-04-18 DIAGNOSIS — Z780 Asymptomatic menopausal state: Secondary | ICD-10-CM | POA: Diagnosis not present

## 2016-04-18 LAB — LIPID PANEL
CHOL/HDL RATIO: 2.5 ratio (ref <5.0)
CHOLESTEROL: 149 mg/dL (ref <200)
HDL: 59 mg/dL (ref >50)
LDL Cholesterol: 79 mg/dL (ref <100)
Triglycerides: 56 mg/dL (ref <150)
VLDL: 11 mg/dL (ref <30)

## 2016-04-18 NOTE — Patient Instructions (Signed)
Annual exam/Gyn exam normal today.  Pap normal 02/2015, will repeat next year.  Schedule Screening mammo at [facility] now.  TSH reflex, Vit D, FLP (Cholesterol) done today.  Will refer for a Screening Colonoscopy.  F/U with me for Annual exam and Bone Density next year.

## 2016-04-18 NOTE — Progress Notes (Signed)
Jennifer Barr 09-Jul-1955 [id number]   History:    61 y.o.  for annual gyn exam.  G4P3A1 married.  Menopause.  D/ced HRT x 6 mths.  No PMB.  No Meno Sx.  Husband has new erection problems, will be evaluated soon.  No pelvic pain.  Breasts wnl.  Last Colono neg at 61 yo.  Past medical history,surgical history, family history and social history were all reviewed and documented in the EPIC chart.  Gynecologic History No LMP recorded. Patient is postmenopausal. Contraception: post menopausal status Last Pap: 02/2015. Results were: normal Last mammogram: 01/2014. Results were: normal  Obstetric History OB History  Gravida Para Term Preterm AB Living  4 3   1   3   SAB TAB Ectopic Multiple Live Births               # Outcome Date GA Lbr Len/2nd Weight Sex Delivery Anes PTL Lv  4 Gravida           3 Para           2 Para           1 Preterm                ROS: A ROS was performed and pertinent positives and negatives are included in the history.  GENERAL: No fevers or chills. HEENT: No change in vision, no earache, sore throat or sinus congestion. NECK: No pain or stiffness. CARDIOVASCULAR: No chest pain or pressure. No palpitations. PULMONARY: No shortness of breath, cough or wheeze. GASTROINTESTINAL: No abdominal pain, nausea, vomiting or diarrhea, melena or bright red blood per rectum. GENITOURINARY: No urinary frequency, urgency, hesitancy or dysuria. MUSCULOSKELETAL: No joint or muscle pain, no back pain, no recent trauma. DERMATOLOGIC: No rash, no itching, no lesions. ENDOCRINE: No polyuria, polydipsia, no heat or cold intolerance. No recent change in weight. HEMATOLOGICAL: No anemia or easy bruising or bleeding. NEUROLOGIC: No headache, seizures, numbness, tingling or weakness. PSYCHIATRIC: No depression, no loss of interest in normal activity or change in sleep pattern.     Exam:   BP 128/72   Ht 5' 8.5" (1.74 m)   Wt 150 lb (68 kg)   BMI 22.48 kg/m   Body mass index is  22.48 kg/m.  General appearance : Well developed well nourished female. No acute distress HEENT: Eyes: no retinal hemorrhage or exudates,  Neck supple, trachea midline, no carotid bruits, no thyroidmegaly Lungs: Clear to auscultation, no rhonchi or wheezes, or rib retractions  Heart: Regular rate and rhythm, no murmurs or gallops Breast:Examined in sitting and supine position were symmetrical in appearance, no palpable masses or tenderness,  no skin retraction, no nipple inversion, no nipple discharge, no skin discoloration, no axillary or supraclavicular lymphadenopathy Abdomen: no palpable masses or tenderness, no rebound or guarding Extremities: no edema or skin discoloration or tenderness  Pelvic:  Bartholin, Urethra, Skene Glands: Within normal limits             Vagina: No gross lesions or discharge  Cervix: No gross lesions or discharge  Uterus  AV, normal size, shape and consistency, non-tender and mobile  Adnexa  Without masses or tenderness  Anus and perineum  normal     Assessment/Plan:  61 y.o. female for annual exam.  1. Encounter for wellness examination in adult Pap wnl 02/2015, will repeat next year. FLP, Vit D, TSH reflex today.  Other labs done q3 mths as patient is followed for Sjogren's.  2.  Menopause present D/ced HRT x 6 months.  Asymptomatic.  No PMB.  Recommended Vit D supplement, continue wt bearing physical activity.  Will schedule BD at next Aex.   Princess Bruins MD, 3:09 PM 04/18/2016

## 2016-04-19 ENCOUNTER — Telehealth: Payer: Self-pay | Admitting: *Deleted

## 2016-04-19 DIAGNOSIS — Z1211 Encounter for screening for malignant neoplasm of colon: Secondary | ICD-10-CM

## 2016-04-19 LAB — TSH: TSH: 1.13 m[IU]/L

## 2016-04-19 NOTE — Telephone Encounter (Signed)
Referral placed at [facility] they will contact pt to schedule. 

## 2016-04-19 NOTE — Telephone Encounter (Signed)
-----   Message from Princess Bruins, MD sent at 04/18/2016  3:37 PM EDT ----- Regarding: Refer for Screening Colonoscopy 62 yo for Screening Colonoscopy.

## 2016-04-20 ENCOUNTER — Encounter: Payer: Self-pay | Admitting: Obstetrics & Gynecology

## 2016-04-20 LAB — VITAMIN D 1,25 DIHYDROXY
VITAMIN D3 1, 25 (OH): 29 pg/mL
Vitamin D 1, 25 (OH)2 Total: 29 pg/mL (ref 18–72)
Vitamin D2 1, 25 (OH)2: <8 pg/mL

## 2016-04-27 NOTE — Telephone Encounter (Signed)
[place] left message x 1 for pt to call to schedule.

## 2016-05-01 ENCOUNTER — Encounter: Payer: Self-pay | Admitting: *Deleted

## 2016-05-04 ENCOUNTER — Telehealth: Payer: Self-pay | Admitting: Rheumatology

## 2016-05-04 ENCOUNTER — Other Ambulatory Visit: Payer: Self-pay | Admitting: Radiology

## 2016-05-04 DIAGNOSIS — Z79899 Other long term (current) drug therapy: Secondary | ICD-10-CM

## 2016-05-04 DIAGNOSIS — M351 Other overlap syndromes: Secondary | ICD-10-CM

## 2016-05-04 LAB — COMPLETE METABOLIC PANEL WITH GFR
AG RATIO: 1.1 ratio (ref 1.0–2.5)
ALBUMIN: 4 g/dL (ref 3.6–5.1)
ALK PHOS: 68 U/L (ref 33–130)
ALT: 11 U/L (ref 6–29)
AST: 46 U/L — AB (ref 10–35)
BUN/Creatinine Ratio: 18.7 Ratio (ref 6–22)
BUN: 17 mg/dL (ref 7–25)
CHLORIDE: 103 mmol/L (ref 98–110)
CO2: 22 mmol/L (ref 20–31)
Calcium: 9 mg/dL (ref 8.6–10.4)
Creat: 0.91 mg/dL (ref 0.50–0.99)
GFR, EST NON AFRICAN AMERICAN: 69 mL/min (ref >=60)
GFR, Est African American: 79 mL/min (ref >=60)
GLUCOSE: 64 mg/dL — AB (ref 65–99)
Globulin: 3.5 g/dL (ref 1.9–3.7)
Potassium: 4.3 mmol/L (ref 3.5–5.3)
Sodium: 136 mmol/L (ref 135–146)
TOTAL PROTEIN: 7.5 g/dL (ref 6.1–8.1)
Total Bilirubin: 0.4 mg/dL (ref 0.2–1.2)

## 2016-05-04 LAB — CBC WITH DIFFERENTIAL/PLATELET
BASOS ABS: 0 {cells}/uL (ref 0–200)
BASOS PCT: 0 %
EOS PCT: 2 %
Eosinophils Absolute: 62 cells/uL (ref 15–500)
HCT: 37.2 % (ref 35.0–45.0)
HEMOGLOBIN: 11.9 g/dL (ref 11.7–15.5)
LYMPHS ABS: 434 {cells}/uL — AB (ref 850–3900)
Lymphocytes Relative: 14 %
MCH: 27.8 pg (ref 27.0–33.0)
MCHC: 32 g/dL (ref 32.0–36.0)
MCV: 86.9 fL (ref 80.0–100.0)
MONOS PCT: 13 %
MPV: 9.1 fL (ref 7.5–12.5)
Monocytes Absolute: 403 cells/uL (ref 200–950)
NEUTROS ABS: 2201 {cells}/uL (ref 1500–7800)
Neutrophils Relative %: 71 %
PLATELETS: 306 10*3/uL (ref 140–400)
RBC: 4.28 MIL/uL (ref 3.80–5.10)
RDW: 13.6 % (ref 11.0–15.0)
WBC: 3.1 10*3/uL — ABNORMAL LOW (ref 3.8–10.8)

## 2016-05-04 NOTE — Telephone Encounter (Signed)
Order's released

## 2016-05-04 NOTE — Telephone Encounter (Signed)
Patient is requesting lab orders be released for [facility]. She will be going this afternoon to have labs drawn. Thanks!

## 2016-05-05 ENCOUNTER — Telehealth: Payer: Self-pay | Admitting: Radiology

## 2016-05-05 LAB — URINALYSIS, MICROSCOPIC ONLY
CRYSTALS: NONE SEEN [HPF]
Casts: NONE SEEN [LPF]
RBC / HPF: NONE SEEN RBC/HPF (ref <=2)
SQUAMOUS EPITHELIAL / LPF: NONE SEEN [HPF] (ref <=5)
YEAST: NONE SEEN [HPF]

## 2016-05-05 LAB — URINALYSIS, ROUTINE W REFLEX MICROSCOPIC
BILIRUBIN URINE: NEGATIVE
GLUCOSE, UA: NEGATIVE
Hgb urine dipstick: NEGATIVE
Ketones, ur: NEGATIVE
Nitrite: NEGATIVE
Protein, ur: NEGATIVE
SPECIFIC GRAVITY, URINE: 1.017 (ref 1.001–1.035)
pH: 6.5 (ref 5.0–8.0)

## 2016-05-05 NOTE — Telephone Encounter (Signed)
I have called patient to advise, if she is symptomatic she will  discuss with her PCP. She states she is fine, felt like she may have had a UTI couple weeks ago but has improved.

## 2016-05-05 NOTE — Telephone Encounter (Signed)
-----   Message from Eliezer Lofts, [place] sent at 05/05/2016  1:05 PM EDT -----  I am sending labs to PCP Please tell patient Urinalysis is consistent with urinary tract infection. She should discuss with her PCP. It will be up to the PCP if they want to treat her if they want to repeat the lab work prior to treatment.

## 2016-05-19 ENCOUNTER — Encounter: Payer: Self-pay | Admitting: *Deleted

## 2016-06-06 ENCOUNTER — Other Ambulatory Visit: Payer: Self-pay | Admitting: Rheumatology

## 2016-06-06 MED FILL — azaTHIOprine 50 MG TABS: 50 | 90 days supply | Qty: 135 | Fill #0

## 2016-06-06 NOTE — Telephone Encounter (Signed)
Last Visit: 02/10/16 Next Visit: 07/18/16 Labs: 05/04/16 WBC 3.1 AST 46  Okay to refill Imuran?

## 2016-06-09 ENCOUNTER — Other Ambulatory Visit: Payer: Self-pay

## 2016-06-12 ENCOUNTER — Other Ambulatory Visit: Payer: Self-pay

## 2016-06-13 ENCOUNTER — Other Ambulatory Visit: Payer: Self-pay

## 2016-06-15 MED ORDER — LEVOTHYROXINE SODIUM 100 MCG PO TABS: 100.0000 ug | ORAL_TABLET | Freq: Every day | ORAL | 4 refills | Status: DC

## 2016-06-15 MED FILL — SYNTHROID 100 MCG TABLET: 100 | 30 days supply | Qty: 30 | Fill #0

## 2016-06-15 NOTE — Telephone Encounter (Signed)
Levothyroxine refilled

## 2016-07-07 ENCOUNTER — Ambulatory Visit (INDEPENDENT_AMBULATORY_CARE_PROVIDER_SITE_OTHER): Payer: 59 | Admitting: Rheumatology

## 2016-07-07 ENCOUNTER — Encounter: Payer: Self-pay | Admitting: Rheumatology

## 2016-07-07 VITALS — BP 118/60 | Resp 14 | Ht 68.5 in | Wt 142.0 lb

## 2016-07-07 DIAGNOSIS — I776 Arteritis, unspecified: Secondary | ICD-10-CM | POA: Diagnosis not present

## 2016-07-07 DIAGNOSIS — Z79899 Other long term (current) drug therapy: Secondary | ICD-10-CM | POA: Diagnosis not present

## 2016-07-07 DIAGNOSIS — M351 Other overlap syndromes: Secondary | ICD-10-CM | POA: Diagnosis not present

## 2016-07-07 DIAGNOSIS — M3500 Sicca syndrome, unspecified: Secondary | ICD-10-CM

## 2016-07-07 DIAGNOSIS — R768 Other specified abnormal immunological findings in serum: Secondary | ICD-10-CM

## 2016-07-07 DIAGNOSIS — I7300 Raynaud's syndrome without gangrene: Secondary | ICD-10-CM

## 2016-07-07 DIAGNOSIS — M419 Scoliosis, unspecified: Secondary | ICD-10-CM

## 2016-07-07 MED ORDER — AZATHIOPRINE 50 MG PO TABS: 75.0000 mg | ORAL_TABLET | Freq: Every day | ORAL | 0 refills | Status: DC

## 2016-07-07 MED ORDER — HYDROXYCHLOROQUINE SULFATE 200 MG PO TABS: 200.0000 mg | ORAL_TABLET | Freq: Every day | ORAL | 1 refills | Status: DC

## 2016-07-07 MED FILL — HYDROXYCHLOROQUINE 200 MG T: 200 | 90 days supply | Qty: 90 | Fill #0

## 2016-07-07 NOTE — Progress Notes (Signed)
Office Visit Note  Patient: Jennifer Barr             Date of Birth: 1955-08-26           MRN: [id number]             PCP: Alycia Rossetti, MD Referring: Alycia Rossetti, MD Visit Date: 07/07/2016 Occupation: '@GUAROCC'$ @    Subjective:  Medication Management   History of Present Illness: Jennifer Barr is a 61 y.o. female  Who was last seen in our office on 02/10/2016 for vasculitis, Sjogren's, mixed connective tissue disease and high-risk prescription.  At the last visit, patient reported that she was doing well with her vasculitis. She did not have any significant rash at that time but she did have a few lesions to her lower extremity that were very mild in nature. She was pleased with her current progress and good results with her treatment.  In addition, her mixed connective tissue disease was doing well also. She did not have any joint pain swelling or stiffness.  Her main complaint at the last visit was that her Sjogren's continued to cause problems. She does have actively dry eyes and dry mouth. She needs to wear special contact lenses to allow her to maintain moisture in her eyes. She also reported that she has to get new contact lenses every few months since they're not prescription lenses.  Her treatment plan includes Imuran, 75 mg daily by using ('50mg'$  --> 1 and 1/2 tablet daily) Plaquenil 200 mg daily; last plq eye exam jan / feb 2018 [facility] vision)  Note: Patient is getting FMLA from our office because her eye problems are so bad and on some days, her eyes are so dry that she is unable to work. We have been filling them out for the patient.  Today, patient is continuing to do well with her mixed connective tissue disease, Sjogren's, her vasculitis and her medications. She is also exercising regularly and that has made a huge impact on her health.  Activities of Daily Living:  Patient reports morning stiffness for 15 minutes.   Patient Denies nocturnal pain.    Difficulty dressing/grooming: Denies Difficulty climbing stairs: Denies Difficulty getting out of chair: Denies Difficulty using hands for taps, buttons, cutlery, and/or writing: Denies   Review of Systems  Constitutional: Negative for fatigue.  HENT: Negative for mouth sores and mouth dryness.   Eyes: Negative for dryness.  Respiratory: Negative for shortness of breath.   Gastrointestinal: Negative for constipation and diarrhea.  Musculoskeletal: Negative for myalgias and myalgias.  Skin: Negative for sensitivity to sunlight.  Psychiatric/Behavioral: Negative for decreased concentration and sleep disturbance.    PMFS History:  Patient Active Problem List   Diagnosis Date Noted  . Mixed connective tissue disease ([place]) 02/10/2016  . ANA positive 02/10/2016  . Rheumatoid factor positive 02/10/2016  . Primary osteoarthritis, right hand 02/10/2016  . Scoliosis 02/10/2016  . Raynaud's syndrome without gangrene 02/10/2016  . Sjoegren syndrome ([place]) 05/07/2014  . Vasculitis ([place]) 05/07/2014  . Hashimoto's thyroiditis 05/07/2014  . Acute sinusitis 01/07/2013  . Bronchitis 01/07/2013  . Conjunctivitis 01/07/2013  . Nevus of skin of lip 01/07/2013  . Laryngitis 01/07/2013    Past Medical History:  Diagnosis Date  . Premature ovarian failure   . Sjoegren syndrome ([place])    Dr. Estanislado Pandy  . Thyroid disease    Hashiomto  . Vasculitis ([place])    Dr. Estanislado Pandy    Family History  Problem Relation  Age of Onset  . Diabetes Mother   . Diabetes Father    No past surgical history on file. Social History   Social History Narrative  . No narrative on file     Objective: Vital Signs: BP 118/60   Resp 14   Ht 5' 8.5" (1.74 m)   Wt 142 lb (64.4 kg)   BMI 21.28 kg/m    Physical Exam  Constitutional: She is oriented to person, place, and time. She appears well-developed and well-nourished.  HENT:  Head: Normocephalic and atraumatic.  Eyes: EOM are normal. Pupils are equal,  round, and reactive to light.  Cardiovascular: Normal rate, regular rhythm and normal heart sounds.  Exam reveals no gallop and no friction rub.   No murmur heard. Pulmonary/Chest: Effort normal and breath sounds normal. She has no wheezes. She has no rales.  Abdominal: Soft. Bowel sounds are normal. She exhibits no distension. There is no tenderness. There is no guarding. No hernia.  Musculoskeletal: Normal range of motion. She exhibits no edema, tenderness or deformity.  Lymphadenopathy:    She has no cervical adenopathy.  Neurological: She is alert and oriented to person, place, and time. Coordination normal.  Skin: Skin is warm and dry. Capillary refill takes less than 2 seconds. No rash noted.  Psychiatric: She has a normal mood and affect. Her behavior is normal.  Nursing note and vitals reviewed.    Musculoskeletal Exam:    CDAI Exam: CDAI Homunculus Exam:   Joint Counts:  CDAI Tender Joint count: 0 CDAI Swollen Joint count: 0  Global Assessments:  Patient Global Assessment: 0 Provider Global Assessment: 0    Investigation: No additional findings. Orders Only on 05/04/2016  Component Date Value Ref Range Status  . WBC 05/04/2016 3.1* 3.8 - 10.8 K/uL Final  . RBC 05/04/2016 4.28  3.80 - 5.10 MIL/uL Final  . Hemoglobin 05/04/2016 11.9  11.7 - 15.5 g/dL Final  . HCT 05/04/2016 37.2  35.0 - 45.0 % Final  . MCV 05/04/2016 86.9  80.0 - 100.0 fL Final  . MCH 05/04/2016 27.8  27.0 - 33.0 pg Final  . MCHC 05/04/2016 32.0  32.0 - 36.0 g/dL Final  . RDW 05/04/2016 13.6  11.0 - 15.0 % Final  . Platelets 05/04/2016 306  140 - 400 K/uL Final  . MPV 05/04/2016 9.1  7.5 - 12.5 fL Final  . Neutro Abs 05/04/2016 2201  1,500 - 7,800 cells/uL Final  . Lymphs Abs 05/04/2016 434* 850 - 3,900 cells/uL Final  . Monocytes Absolute 05/04/2016 403  200 - 950 cells/uL Final  . Eosinophils Absolute 05/04/2016 62  15 - 500 cells/uL Final  . Basophils Absolute 05/04/2016 0  0 - 200 cells/uL  Final  . Neutrophils Relative % 05/04/2016 71  % Final  . Lymphocytes Relative 05/04/2016 14  % Final  . Monocytes Relative 05/04/2016 13  % Final  . Eosinophils Relative 05/04/2016 2  % Final  . Basophils Relative 05/04/2016 0  % Final  . Smear Review 05/04/2016 Criteria for review not met   Final  . Sodium 05/04/2016 136  135 - 146 mmol/L Final  . Potassium 05/04/2016 4.3  3.5 - 5.3 mmol/L Final  . Chloride 05/04/2016 103  98 - 110 mmol/L Final  . CO2 05/04/2016 22  20 - 31 mmol/L Final  . Glucose, Bld 05/04/2016 64* 65 - 99 mg/dL Final  . BUN 05/04/2016 17  7 - 25 mg/dL Final  . Creat 05/04/2016 0.91  0.50 -  0.99 mg/dL Final   Comment:   For patients > or = 60 years of age: The upper reference limit for Creatinine is approximately 13% higher for people identified as African-American.     . Total Bilirubin 05/04/2016 0.4  0.2 - 1.2 mg/dL Final  . Alkaline Phosphatase 05/04/2016 68  33 - 130 U/L Final  . AST 05/04/2016 46* 10 - 35 U/L Final  . ALT 05/04/2016 11  6 - 29 U/L Final  . Total Protein 05/04/2016 7.5  6.1 - 8.1 g/dL Final  . Albumin 05/04/2016 4.0  3.6 - 5.1 g/dL Final  . Calcium 05/04/2016 9.0  8.6 - 10.4 mg/dL Final  . Globulin 05/04/2016 3.5  1.9 - 3.7 g/dL Final  . AG Ratio 05/04/2016 1.1  1.0 - 2.5 Ratio Final  . BUN/Creatinine Ratio 05/04/2016 18.7  6 - 22 Ratio Final  . GFR, Est African American 05/04/2016 79  >=60 mL/min Final  . GFR, Est Non African American 05/04/2016 69  >=60 mL/min Final  . Color, Urine 05/04/2016 YELLOW  YELLOW Final  . APPearance 05/04/2016 CLEAR  CLEAR Final  . Specific Gravity, Urine 05/04/2016 1.017  1.001 - 1.035 Final  . pH 05/04/2016 6.5  5.0 - 8.0 Final  . Glucose, UA 05/04/2016 NEGATIVE  NEGATIVE Final  . Bilirubin Urine 05/04/2016 NEGATIVE  NEGATIVE Final  . Ketones, ur 05/04/2016 NEGATIVE  NEGATIVE Final  . Hgb urine dipstick 05/04/2016 NEGATIVE  NEGATIVE Final  . Protein, ur 05/04/2016 NEGATIVE  NEGATIVE Final  . Nitrite  05/04/2016 NEGATIVE  NEGATIVE Final  . Leukocytes, UA 05/04/2016 2+* NEGATIVE Final  . WBC, UA 05/04/2016 20-40* <=5 WBC/HPF Final  . RBC / HPF 05/04/2016 NONE SEEN  <=2 RBC/HPF Final  . Squamous Epithelial / LPF 05/04/2016 NONE SEEN  <=5 HPF Final  . Bacteria, UA 05/04/2016 FEW* NONE SEEN HPF Final  . Crystals 05/04/2016 NONE SEEN  NONE SEEN HPF Final  . Casts 05/04/2016 NONE SEEN  NONE SEEN LPF Final  . Yeast 05/04/2016 NONE SEEN  NONE SEEN HPF Final  Office Visit on 04/18/2016  Component Date Value Ref Range Status  . TSH 04/18/2016 1.13  mIU/L Final   Comment:   Reference Range   > or = 20 Years  0.40-4.50   Pregnancy Range First trimester  0.26-2.66 Second trimester 0.55-2.73 Third trimester  0.43-2.91     . Cholesterol 04/18/2016 149  <200 mg/dL Final  . Triglycerides 04/18/2016 56  <150 mg/dL Final  . HDL 04/18/2016 59  >50 mg/dL Final  . Total CHOL/HDL Ratio 04/18/2016 2.5  <5.0 Ratio Final  . VLDL 04/18/2016 11  <30 mg/dL Final  . LDL Cholesterol 04/18/2016 79  <100 mg/dL Final  . Vitamin D 1, 25 (OH)2 Total 04/18/2016 29  18 - 72 pg/mL Final  . Vitamin D3 1, 25 (OH)2 04/18/2016 29  pg/mL Final  . Vitamin D2 1, 25 (OH)2 04/18/2016 <8  pg/mL Final   Comment: Vitamin D3, 1,25(OH)2 indicates both endogenous production and supplementation.  Vitamin D2, 1,25(OH)2 is an indicator of exogeous sources, such as diet or supplementation.  Interpretation and therapy are based on measurement of Vitamin D,1,25(OH)2, Total. This test was developed and its analytical performance characteristics have been determined by [facility], [place], [place]. It has not been cleared or approved by the FDA. This assay has been validated pursuant to the CLIA regulations and is used for clinical purposes.   Orders Only on 02/07/2016  Component Date Value Ref Range  Status  . Sodium 02/07/2016 137  135 - 146 mmol/L Final  . Potassium 02/07/2016 4.3  3.5 - 5.3 mmol/L Final   . Chloride 02/07/2016 105  98 - 110 mmol/L Final  . CO2 02/07/2016 25  20 - 31 mmol/L Final  . Glucose, Bld 02/07/2016 90  65 - 99 mg/dL Final  . BUN 02/07/2016 14  7 - 25 mg/dL Final  . Creat 02/07/2016 0.84  0.50 - 1.05 mg/dL Final   Comment:   For patients > or = 60 years of age: The upper reference limit for Creatinine is approximately 13% higher for people identified as African-American.     . Total Bilirubin 02/07/2016 0.6  0.2 - 1.2 mg/dL Final  . Alkaline Phosphatase 02/07/2016 64  33 - 130 U/L Final  . AST 02/07/2016 41* 10 - 35 U/L Final  . ALT 02/07/2016 10  6 - 29 U/L Final  . Total Protein 02/07/2016 7.8  6.1 - 8.1 g/dL Final  . Albumin 02/07/2016 4.2  3.6 - 5.1 g/dL Final  . Calcium 02/07/2016 9.2  8.6 - 10.4 mg/dL Final  . GFR, Est African American 02/07/2016 88  >=60 mL/min Final  . GFR, Est Non African American 02/07/2016 76  >=60 mL/min Final  . WBC 02/07/2016 3.9  3.8 - 10.8 K/uL Final  . RBC 02/07/2016 4.23  3.80 - 5.10 MIL/uL Final  . Hemoglobin 02/07/2016 12.2  11.7 - 15.5 g/dL Final  . HCT 02/07/2016 36.9  35.0 - 45.0 % Final  . MCV 02/07/2016 87.2  80.0 - 100.0 fL Final  . MCH 02/07/2016 28.8  27.0 - 33.0 pg Final  . MCHC 02/07/2016 33.1  32.0 - 36.0 g/dL Final  . RDW 02/07/2016 13.5  11.0 - 15.0 % Final  . Platelets 02/07/2016 254  140 - 400 K/uL Final  . MPV 02/07/2016 9.2  7.5 - 12.5 fL Final  . Neutro Abs 02/07/2016 2847  1,500 - 7,800 cells/uL Final  . Lymphs Abs 02/07/2016 624* 850 - 3,900 cells/uL Final  . Monocytes Absolute 02/07/2016 312  200 - 950 cells/uL Final  . Eosinophils Absolute 02/07/2016 78  15 - 500 cells/uL Final  . Basophils Absolute 02/07/2016 39  0 - 200 cells/uL Final  . Neutrophils Relative % 02/07/2016 73  % Final  . Lymphocytes Relative 02/07/2016 16  % Final  . Monocytes Relative 02/07/2016 8  % Final  . Eosinophils Relative 02/07/2016 2  % Final  . Basophils Relative 02/07/2016 1  % Final  . Smear Review 02/07/2016  Criteria for review not met   Final     Imaging: No results found.  Speciality Comments: No specialty comments available.    Procedures:  No procedures performed Allergies: Sulfa antibiotics   Assessment / Plan:     Visit Diagnoses: Sjogren's syndrome, with unspecified organ involvement ([place]) - Plan: CBC with Differential/Platelet, COMPLETE METABOLIC PANEL WITH GFR, Urinalysis, Routine w reflex microscopic, Sedimentation rate, Rheumatoid factor, Protein electrophoresis, serum  ANA positive - +ANA,+RF, +Ro, +La; on Imuran '75mg'$   & PLQ '200mg'$  qd ;  - Plan: CBC with Differential/Platelet, COMPLETE METABOLIC PANEL WITH GFR, Urinalysis, Routine w reflex microscopic, Sedimentation rate, Rheumatoid factor, Protein electrophoresis, serum  Raynaud's syndrome without gangrene  Vasculitis ([facility]) - Plan: CBC with Differential/Platelet, COMPLETE METABOLIC PANEL WITH GFR, Urinalysis, Routine w reflex microscopic, Sedimentation rate, Rheumatoid factor, Protein electrophoresis, serum  Mixed connective tissue disease ([facility]) - +ANA,+RF, +Ro, +La; on Imuran '75mg'$   & PLQ '200mg'$  qd ;  -  Plan: CBC with Differential/Platelet, COMPLETE METABOLIC PANEL WITH GFR, Urinalysis, Routine w reflex microscopic, Sedimentation rate, Rheumatoid factor, Protein electrophoresis, serum  Scoliosis, unspecified scoliosis type, unspecified spinal region  High risk medications (not anticoagulants) long-term use - Plan: CBC with Differential/Platelet, COMPLETE METABOLIC PANEL WITH GFR, Urinalysis, Routine w reflex microscopic, Sedimentation rate, Rheumatoid factor, Protein electrophoresis, serum   Plan: #1: Sjogren's. Patient has a history of severe Sjogren's with very dry eye history. She has to wear special contact lenses to keep her eyes moisturize. She frequently uses eyedrops. She also has dry mouth. Despite being on Imuran and Plaquenil regularly, she continues to have significant dry eyes and dry mouth (sicca symptoms).  #2:  History of Raynaud's. No flare. Doing well.  #3: History of vasculitis. Doing well.; No flare.  #4: History of mixed connective tissue disease. History of positive ANA, positive Ro, positive LOC,. No flare.  #5: High risk prescription. On Imuran 50 mg; 1-1/2 tablets daily. Adequate relief  On Plaquenil 200 mg daily. Adequate relief Patient has had her Plaquenil eye exam done through [facility] and was normal and up-to-date.  #6: History of FMLA due to dry eyes. Were signing her forms on a regular basis and she does not need a new FMLA for now. It has been done on an annual basis but patient states that they may have changed the policy and she may needed to be updated every 6 months. She states that she avoids taking time off work and lancets absolute necessary.   #7: We need to update her autoimmune labs. With the CBC with differential and CMP with GFR and urinalysis due to July 2018, we will add sedimentation rate, SPEP, rheumatoid factor. (She has had rheumatoid factor positive in the past.  #8: Patient is exercising regularly and experiencing better health as a result.  She has less pain and better days after exercsing.  Orders: Orders Placed This Encounter  Procedures  . CBC with Differential/Platelet  . COMPLETE METABOLIC PANEL WITH GFR  . Urinalysis, Routine w reflex microscopic  . Sedimentation rate  . Rheumatoid factor  . Protein electrophoresis, serum   Meds ordered this encounter  Medications  . azaTHIOprine (IMURAN) 50 MG tablet    Sig: Take 1.5 tablets (75 mg total) by mouth daily.    Dispense:  135 tablet    Refill:  0    Order Specific Question:   Supervising Provider    Answer:   Bo Merino [[id number]]  . hydroxychloroquine (PLAQUENIL) 200 MG tablet    Sig: Take 1 tablet (200 mg total) by mouth daily.    Dispense:  90 tablet    Refill:  1    Order Specific Question:   Supervising Provider    Answer:   Bo Merino [phone]     Face-to-face time spent with patient was 30 minutes. 50% of time was spent in counseling and coordination of care.  Follow-Up Instructions: Return in about 5 months (around 12/07/2016) for MCTD, Vasculitis, Sjog, Raynauds, +ANA, +RF, +Ro, +La.   Eliezer Lofts, PA-C I examined and evaluated the patient with Eliezer Lofts PA.She's been exercising on regular basis and has been feeling quite good except for sicca symptoms. We will check above labs with her next blood draw. The plan of care was discussed as noted above.  Bo Merino, MD Note - This record has been created using Editor, commissioning.  Chart creation errors have been sought, but may not always  have been located. Such creation errors do  not reflect on  the standard of medical care.

## 2016-07-09 MED FILL — SYNTHROID 100 MCG TABLET: 100 | 90 days supply | Qty: 90 | Fill #1

## 2016-07-18 ENCOUNTER — Ambulatory Visit: Payer: 59 | Admitting: Rheumatology

## 2016-08-03 ENCOUNTER — Encounter: Payer: Self-pay | Admitting: *Deleted

## 2016-08-03 ENCOUNTER — Other Ambulatory Visit: Payer: Self-pay | Admitting: Rheumatology

## 2016-08-03 ENCOUNTER — Other Ambulatory Visit: Payer: Self-pay | Admitting: *Deleted

## 2016-08-03 DIAGNOSIS — M351 Other overlap syndromes: Secondary | ICD-10-CM | POA: Diagnosis not present

## 2016-08-03 DIAGNOSIS — M3500 Sicca syndrome, unspecified: Secondary | ICD-10-CM | POA: Diagnosis not present

## 2016-08-03 DIAGNOSIS — R768 Other specified abnormal immunological findings in serum: Secondary | ICD-10-CM | POA: Diagnosis not present

## 2016-08-03 DIAGNOSIS — I776 Arteritis, unspecified: Secondary | ICD-10-CM | POA: Diagnosis not present

## 2016-08-03 DIAGNOSIS — Z79899 Other long term (current) drug therapy: Secondary | ICD-10-CM

## 2016-08-03 DIAGNOSIS — R300 Dysuria: Secondary | ICD-10-CM

## 2016-08-03 LAB — CBC WITH DIFFERENTIAL/PLATELET
BASOS ABS: 31 {cells}/uL (ref 0–200)
Basophils Relative: 1 %
EOS PCT: 1 %
Eosinophils Absolute: 31 cells/uL (ref 15–500)
HCT: 40.5 % (ref 35.0–45.0)
HEMOGLOBIN: 13.2 g/dL (ref 11.7–15.5)
LYMPHS ABS: 620 {cells}/uL — AB (ref 850–3900)
Lymphocytes Relative: 20 %
MCH: 28.6 pg (ref 27.0–33.0)
MCHC: 32.6 g/dL (ref 32.0–36.0)
MCV: 87.9 fL (ref 80.0–100.0)
MONOS PCT: 13 %
MPV: 9.3 fL (ref 7.5–12.5)
Monocytes Absolute: 403 cells/uL (ref 200–950)
NEUTROS ABS: 2015 {cells}/uL (ref 1500–7800)
Neutrophils Relative %: 65 %
PLATELETS: 258 10*3/uL (ref 140–400)
RBC: 4.61 MIL/uL (ref 3.80–5.10)
RDW: 14.1 % (ref 11.0–15.0)
WBC: 3.1 10*3/uL — AB (ref 3.8–10.8)

## 2016-08-03 LAB — COMPLETE METABOLIC PANEL WITH GFR
ALBUMIN: 4.2 g/dL (ref 3.6–5.1)
ALK PHOS: 79 U/L (ref 33–130)
ALT: 10 U/L (ref 6–29)
AST: 37 U/L — ABNORMAL HIGH (ref 10–35)
BILIRUBIN TOTAL: 0.5 mg/dL (ref 0.2–1.2)
BUN: 16 mg/dL (ref 7–25)
CALCIUM: 9.4 mg/dL (ref 8.6–10.4)
CO2: 22 mmol/L (ref 20–31)
Chloride: 103 mmol/L (ref 98–110)
Creat: 0.89 mg/dL (ref 0.50–0.99)
GFR, EST AFRICAN AMERICAN: 81 mL/min (ref >=60)
GFR, EST NON AFRICAN AMERICAN: 71 mL/min (ref >=60)
GLUCOSE: 78 mg/dL (ref 65–99)
POTASSIUM: 4.3 mmol/L (ref 3.5–5.3)
SODIUM: 137 mmol/L (ref 135–146)
TOTAL PROTEIN: 7.6 g/dL (ref 6.1–8.1)

## 2016-08-04 LAB — URINALYSIS, ROUTINE W REFLEX MICROSCOPIC
Bilirubin Urine: NEGATIVE
Glucose, UA: NEGATIVE
HGB URINE DIPSTICK: NEGATIVE
KETONES UR: NEGATIVE
LEUKOCYTES UA: NEGATIVE
NITRITE: NEGATIVE
Protein, ur: NEGATIVE
Specific Gravity, Urine: 1.017 (ref 1.001–1.035)
pH: 6.5 (ref 5.0–8.0)

## 2016-08-04 LAB — RHEUMATOID FACTOR: RHEUMATOID FACTOR: 16 [IU]/mL — AB (ref <14)

## 2016-08-04 LAB — SEDIMENTATION RATE: SED RATE: 18 mm/h (ref 0–30)

## 2016-08-08 LAB — PROTEIN ELECTROPHORESIS, SERUM
ALBUMIN ELP: 4.3 g/dL (ref 3.8–4.8)
Alpha-1-Globulin: 0.3 g/dL (ref 0.2–0.3)
Alpha-2-Globulin: 0.8 g/dL (ref 0.5–0.9)
BETA 2: 0.4 g/dL (ref 0.2–0.5)
Beta Globulin: 0.5 g/dL (ref 0.4–0.6)
GAMMA GLOBULIN: 1.7 g/dL (ref 0.8–1.7)
Total Protein, Serum Electrophoresis: 7.9 g/dL (ref 6.1–8.1)

## 2016-08-09 NOTE — Progress Notes (Signed)
Labs are stable.

## 2016-08-11 ENCOUNTER — Ambulatory Visit
Admission: RE | Admit: 2016-08-11 | Discharge: 2016-08-11 | Disposition: A | Discharge status: Home / Self Care | Payer: Commercial Managed Care - PPO | Source: Ambulatory Visit | Attending: Obstetrics & Gynecology | Admitting: Obstetrics & Gynecology

## 2016-08-11 ENCOUNTER — Other Ambulatory Visit: Payer: Self-pay | Admitting: Obstetrics & Gynecology

## 2016-08-11 DIAGNOSIS — Z1231 Encounter for screening mammogram for malignant neoplasm of breast: Secondary | ICD-10-CM

## 2016-09-01 MED FILL — azaTHIOprine 50 MG TABS: 50 | 90 days supply | Qty: 135 | Fill #0

## 2016-09-01 MED FILL — PILOCARPINE HCL 5 MG TABLET: 5 | 90 days supply | Qty: 270 | Fill #1

## 2016-09-13 DIAGNOSIS — M25531 Pain in right wrist: Secondary | ICD-10-CM | POA: Diagnosis not present

## 2016-09-27 DIAGNOSIS — M25531 Pain in right wrist: Secondary | ICD-10-CM | POA: Diagnosis not present

## 2016-10-06 MED FILL — HYDROXYCHLOROQUINE 200 MG T: 200 | 90 days supply | Qty: 90 | Fill #1

## 2016-10-06 MED FILL — SYNTHROID 100 MCG TABLET: 100 | 90 days supply | Qty: 90 | Fill #2

## 2016-10-18 DIAGNOSIS — M25531 Pain in right wrist: Secondary | ICD-10-CM | POA: Diagnosis not present

## 2016-11-02 DIAGNOSIS — R300 Dysuria: Secondary | ICD-10-CM | POA: Diagnosis not present

## 2016-11-02 DIAGNOSIS — Z79899 Other long term (current) drug therapy: Secondary | ICD-10-CM | POA: Diagnosis not present

## 2016-11-02 LAB — COMPLETE METABOLIC PANEL WITH GFR
AG RATIO: 1.1 (calc) (ref 1.0–2.5)
ALKALINE PHOSPHATASE (APISO): 73 U/L (ref 33–130)
ALT: 9 U/L (ref 6–29)
AST: 39 U/L — AB (ref 10–35)
Albumin: 4.1 g/dL (ref 3.6–5.1)
BILIRUBIN TOTAL: 0.5 mg/dL (ref 0.2–1.2)
BUN: 15 mg/dL (ref 7–25)
CHLORIDE: 102 mmol/L (ref 98–110)
CO2: 27 mmol/L (ref 20–32)
Calcium: 9.3 mg/dL (ref 8.6–10.4)
Creat: 0.96 mg/dL (ref 0.50–0.99)
GFR, EST AFRICAN AMERICAN: 75 mL/min/{1.73_m2} (ref >=60)
GFR, EST NON AFRICAN AMERICAN: 64 mL/min/{1.73_m2} (ref >=60)
GLUCOSE: 82 mg/dL (ref 65–99)
Globulin: 3.7 g/dL (calc) (ref 1.9–3.7)
Potassium: 4.7 mmol/L (ref 3.5–5.3)
SODIUM: 135 mmol/L (ref 135–146)
Total Protein: 7.8 g/dL (ref 6.1–8.1)

## 2016-11-02 LAB — CBC WITH DIFFERENTIAL/PLATELET
BASOS PCT: 0.5 %
Basophils Absolute: 19 cells/uL (ref 0–200)
EOS ABS: 49 {cells}/uL (ref 15–500)
Eosinophils Relative: 1.3 %
HCT: 37.8 % (ref 35.0–45.0)
Hemoglobin: 12.6 g/dL (ref 11.7–15.5)
Lymphs Abs: 627 cells/uL — ABNORMAL LOW (ref 850–3900)
MCH: 28.4 pg (ref 27.0–33.0)
MCHC: 33.3 g/dL (ref 32.0–36.0)
MCV: 85.3 fL (ref 80.0–100.0)
MONOS PCT: 8.4 %
MPV: 9.5 fL (ref 7.5–12.5)
NEUTROS PCT: 73.3 %
Neutro Abs: 2785 cells/uL (ref 1500–7800)
PLATELETS: 267 10*3/uL (ref 140–400)
RBC: 4.43 10*6/uL (ref 3.80–5.10)
RDW: 12.8 % (ref 11.0–15.0)
TOTAL LYMPHOCYTE: 16.5 %
WBC: 3.8 10*3/uL (ref 3.8–10.8)
WBCMIX: 319 {cells}/uL (ref 200–950)

## 2016-11-02 LAB — URINALYSIS, ROUTINE W REFLEX MICROSCOPIC
Bilirubin Urine: NEGATIVE
GLUCOSE, UA: NEGATIVE
HGB URINE DIPSTICK: NEGATIVE
KETONES UR: NEGATIVE
Leukocytes, UA: NEGATIVE
NITRITE: NEGATIVE
PH: 6 (ref 5.0–8.0)
PROTEIN: NEGATIVE
Specific Gravity, Urine: 1.017 (ref 1.001–1.03)

## 2016-11-02 NOTE — Progress Notes (Signed)
I tried calling patient but could not reach her. She has persistently increased platelets. I will refer her to hematology for evaluation. Please discussed with patient.

## 2016-11-29 NOTE — Progress Notes (Signed)
Office Visit Note  Patient: Jennifer Barr             Date of Birth: 24-Oct-1955           MRN: [id number]             PCP: Alycia Rossetti, MD Referring: Alycia Rossetti, MD Visit Date: 12/12/2016 Occupation: @GUAROCC @    Subjective:  Other (having eye problems )   History of Present Illness: Jennifer Barr is a 61 y.o. female with history of vasculitis, Sjogren's and osteoarthritis. She continues to have sicca symptoms. She states she has no problems with dry eyes. She had mild rash on her lower extremities. She continues to take Imuran and Plaquenil . Her Raynauds is not very active. She states that she fractured her right wrist as the blinds fell on her wrist. It took about 3 months to heal. She has been getting her DEXA scan through her GYN. I do not have those results available.  Activities of Daily Living:  Patient reports morning stiffness for 15 minutes.   Patient Denies nocturnal pain.  Difficulty dressing/grooming: Denies Difficulty climbing stairs: Denies Difficulty getting out of chair: Denies Difficulty using hands for taps, buttons, cutlery, and/or writing: Reports right wrist joint due to fracture   Review of Systems  Constitutional: Positive for fatigue. Negative for night sweats, weight gain, weight loss and weakness.  HENT: Positive for mouth dryness. Negative for mouth sores, trouble swallowing, trouble swallowing and nose dryness.   Eyes: Positive for dryness. Negative for pain, redness and visual disturbance.  Respiratory: Negative for cough, shortness of breath and difficulty breathing.   Cardiovascular: Negative for chest pain, palpitations, hypertension, irregular heartbeat and swelling in legs/feet.  Gastrointestinal: Negative for blood in stool, constipation and diarrhea.  Endocrine: Negative for increased urination.  Genitourinary: Negative for vaginal dryness.  Musculoskeletal: Positive for morning stiffness. Negative for arthralgias, joint pain,  joint swelling, myalgias, muscle weakness, muscle tenderness and myalgias.  Skin: Positive for color change. Negative for rash, hair loss, skin tightness, ulcers and sensitivity to sunlight.  Allergic/Immunologic: Negative for susceptible to infections.  Neurological: Negative for dizziness, memory loss and night sweats.  Hematological: Negative for swollen glands.  Psychiatric/Behavioral: Positive for sleep disturbance. Negative for depressed mood. The patient is not nervous/anxious.     PMFS History:  Patient Active Problem List   Diagnosis Date Noted  . Mixed connective tissue disease ([place]) 02/10/2016  . ANA positive 02/10/2016  . Rheumatoid factor positive 02/10/2016  . Primary osteoarthritis, right hand 02/10/2016  . Scoliosis 02/10/2016  . Raynaud's syndrome without gangrene 02/10/2016  . Sjoegren syndrome ([place]) 05/07/2014  . Vasculitis ([place]) 05/07/2014  . Hashimoto's thyroiditis 05/07/2014  . Acute sinusitis 01/07/2013  . Bronchitis 01/07/2013  . Conjunctivitis 01/07/2013  . Nevus of skin of lip 01/07/2013  . Laryngitis 01/07/2013    Past Medical History:  Diagnosis Date  . Premature ovarian failure   . Sjoegren syndrome ([place])    Dr. Estanislado Pandy  . Thyroid disease    Hashiomto  . Vasculitis ([place])    Dr. Estanislado Pandy    Family History  Problem Relation Age of Onset  . Diabetes Mother   . Diabetes Father   . Scleroderma Daughter    History reviewed. No pertinent surgical history. Social History   Social History Narrative  . Not on file     Objective: Vital Signs: BP 109/64 (BP Location: Left Arm, Patient Position: Sitting, Cuff Size: Normal)   Pulse 64  Resp 16   Ht 5\' 9"  (1.753 m)   Wt 156 lb (70.8 kg)   BMI 23.04 kg/m    Physical Exam  Constitutional: She is oriented to person, place, and time. She appears well-developed and well-nourished.  HENT:  Head: Normocephalic and atraumatic.  Eyes: Conjunctivae and EOM are normal.  Mild conjunctival  injection was noted. She has dry oral mucosa.  Neck: Normal range of motion.  Cardiovascular: Normal rate, regular rhythm, normal heart sounds and intact distal pulses.  Pulmonary/Chest: Effort normal and breath sounds normal.  Abdominal: Soft. Bowel sounds are normal.  Lymphadenopathy:    She has no cervical adenopathy.  Neurological: She is alert and oriented to person, place, and time.  Skin: Skin is warm and dry. Capillary refill takes less than 2 seconds.  Fine petechial rash on bilateral LEs  Psychiatric: She has a normal mood and affect. Her behavior is normal.  Nursing note and vitals reviewed.    Musculoskeletal Exam: C-spine and thoracic lumbar spine good range of motion. Shoulder joints elbow joints wrist joint MCPs PIPs DIPs with good range of motion with no synovitis. Hip joints knee joints ankles MTPs PIPs DIPs with good range of motion with no synovitis.  CDAI Exam: No CDAI exam completed.    Investigation: No additional findings.PLQ eye exam: ? CBC Latest Ref Rng & Units 11/02/2016 08/03/2016 05/04/2016  WBC 3.8 - 10.8 Thousand/uL 3.8 3.1(L) 3.1(L)  Hemoglobin 11.7 - 15.5 g/dL 12.6 13.2 11.9  Hematocrit 35.0 - 45.0 % 37.8 40.5 37.2  Platelets 140 - 400 Thousand/uL 267 258 306   CMP Latest Ref Rng & Units 11/02/2016 08/03/2016 05/04/2016  Glucose 65 - 99 mg/dL 82 78 64(L)  BUN 7 - 25 mg/dL 15 16 17   Creatinine 0.50 - 0.99 mg/dL 0.96 0.89 0.91  Sodium 135 - 146 mmol/L 135 137 136  Potassium 3.5 - 5.3 mmol/L 4.7 4.3 4.3  Chloride 98 - 110 mmol/L 102 103 103  CO2 20 - 32 mmol/L 27 22 22   Calcium 8.6 - 10.4 mg/dL 9.3 9.4 9.0  Total Protein 6.1 - 8.1 g/dL 7.8 7.6 7.5  Total Bilirubin 0.2 - 1.2 mg/dL 0.5 0.5 0.4  Alkaline Phos 33 - 130 U/L - 79 68  AST 10 - 35 U/L 39(H) 37(H) 46(H)  ALT 6 - 29 U/L 9 10 11     Imaging: No results found.  Speciality Comments: No specialty comments available.    Procedures:  No procedures performed Allergies: Sulfa antibiotics    Assessment / Plan:     Visit Diagnoses: Vasculitis [facility]): Patient has been doing well on current combination therapy. She has occasional mild petechial rash in her bilateral lower extremities which she's having today. She states usually is resolves in 1 or 2 days.  Mixed connective tissue disease ([place]) - +RF, +ANA, +Ro, +La.  High risk medication use - PLQ 200 mg by mouth daily, Imuran 75 mg by mouth dailyeye exam February 2018 per patient - Plan: CBC with Differential/Platelet, COMPLETE METABOLIC PANEL WITH GFR will be checked in January and then every 3 months to monitor for drug toxicity.  Sjogren's syndrome with other organ involvement [facility]): She continues to have sicca symptoms for which she's been using over-the-counter products.  Raynaud's syndrome without gangrene: Warm clothing and protection from cold-weather was discussed.  Primary osteoarthritis of both hands : She has mild osteoarthritic changes in her hands. Joint protection and muscle strengthening was discussed.  Scoliosis, unspecified scoliosis type, unspecified spinal region  History of hypothyroidism  Hashimoto's thyroiditis    Orders: Orders Placed This Encounter  Procedures  . CBC with Differential/Platelet  . COMPLETE METABOLIC PANEL WITH GFR   No orders of the defined types were placed in this encounter.   Face-to-face time spent with patient was 30 minutes. Greater than 50% of time was spent in counseling and coordination of care.  Follow-Up Instructions: Return in about 6 months (around 06/12/2017) for Vasculitis , Sjogren's, OA.   Bo Merino, MD  Note - This record has been created using Editor, commissioning.  Chart creation errors have been sought, but may not always  have been located. Such creation errors do not reflect on  the standard of medical care.

## 2016-12-05 ENCOUNTER — Other Ambulatory Visit: Payer: Self-pay | Admitting: Rheumatology

## 2016-12-05 MED FILL — azaTHIOprine 50 MG TABS: 50 | 90 days supply | Qty: 135 | Fill #0

## 2016-12-05 NOTE — Telephone Encounter (Signed)
Last Visit: 6/69/18 Next Visit: 12/12/16 Labs: 11/02/16 stable  Okay to refill per Dr. Estanislado Pandy

## 2016-12-12 ENCOUNTER — Ambulatory Visit (INDEPENDENT_AMBULATORY_CARE_PROVIDER_SITE_OTHER): Payer: 59 | Admitting: Rheumatology

## 2016-12-12 ENCOUNTER — Encounter: Payer: Self-pay | Admitting: Rheumatology

## 2016-12-12 VITALS — BP 109/64 | HR 64 | Resp 16 | Ht 69.0 in | Wt 156.0 lb

## 2016-12-12 DIAGNOSIS — Z79899 Other long term (current) drug therapy: Secondary | ICD-10-CM | POA: Diagnosis not present

## 2016-12-12 DIAGNOSIS — I7300 Raynaud's syndrome without gangrene: Secondary | ICD-10-CM | POA: Diagnosis not present

## 2016-12-12 DIAGNOSIS — M19041 Primary osteoarthritis, right hand: Secondary | ICD-10-CM

## 2016-12-12 DIAGNOSIS — M419 Scoliosis, unspecified: Secondary | ICD-10-CM | POA: Diagnosis not present

## 2016-12-12 DIAGNOSIS — Z8639 Personal history of other endocrine, nutritional and metabolic disease: Secondary | ICD-10-CM

## 2016-12-12 DIAGNOSIS — M351 Other overlap syndromes: Secondary | ICD-10-CM

## 2016-12-12 DIAGNOSIS — M3509 Sicca syndrome with other organ involvement: Secondary | ICD-10-CM

## 2016-12-12 DIAGNOSIS — I776 Arteritis, unspecified: Secondary | ICD-10-CM | POA: Diagnosis not present

## 2016-12-12 DIAGNOSIS — E063 Autoimmune thyroiditis: Secondary | ICD-10-CM

## 2016-12-12 DIAGNOSIS — M19042 Primary osteoarthritis, left hand: Secondary | ICD-10-CM

## 2016-12-12 NOTE — Patient Instructions (Addendum)
Standing Labs We placed an order today for your standing lab work.    Please come back and get your standing labs in January and every 3 months.  We have open lab Monday through Friday from 8:30-11:30 AM and 1:30-4 PM at the office of Dr. Bo Merino.   The office is located at [address] [place], [place], [place] [address] No appointment is necessary.   Labs are drawn by [facility].  You may receive a bill from [facility] for your lab work. If you have any questions regarding directions or hours of operation,  please call [phone].    Please schedule DXA scan with your GYN.

## 2017-01-01 MED FILL — SYNTHROID 100 MCG TABLET: 100 | 90 days supply | Qty: 90 | Fill #3

## 2017-01-01 MED FILL — HYDROXYCHLOROQUINE 200 MG: 200 | 90 days supply | Qty: 90 | Fill #1

## 2017-02-02 ENCOUNTER — Telehealth: Payer: Self-pay | Admitting: Rheumatology

## 2017-02-02 ENCOUNTER — Other Ambulatory Visit: Payer: Self-pay | Admitting: *Deleted

## 2017-02-02 DIAGNOSIS — Z79899 Other long term (current) drug therapy: Secondary | ICD-10-CM | POA: Diagnosis not present

## 2017-02-02 LAB — CBC WITH DIFFERENTIAL/PLATELET
BASOS PCT: 0.5 %
Basophils Absolute: 20 cells/uL (ref 0–200)
EOS ABS: 39 {cells}/uL (ref 15–500)
Eosinophils Relative: 1 %
HCT: 36 % (ref 35.0–45.0)
HEMOGLOBIN: 12 g/dL (ref 11.7–15.5)
Lymphs Abs: 675 cells/uL — ABNORMAL LOW (ref 850–3900)
MCH: 28.4 pg (ref 27.0–33.0)
MCHC: 33.3 g/dL (ref 32.0–36.0)
MCV: 85.3 fL (ref 80.0–100.0)
MPV: 9.5 fL (ref 7.5–12.5)
Monocytes Relative: 10.1 %
NEUTROS ABS: 2773 {cells}/uL (ref 1500–7800)
Neutrophils Relative %: 71.1 %
Platelets: 268 10*3/uL (ref 140–400)
RBC: 4.22 10*6/uL (ref 3.80–5.10)
RDW: 13.2 % (ref 11.0–15.0)
Total Lymphocyte: 17.3 %
WBC: 3.9 10*3/uL (ref 3.8–10.8)
WBCMIX: 394 {cells}/uL (ref 200–950)

## 2017-02-02 LAB — COMPLETE METABOLIC PANEL WITH GFR
AG Ratio: 1.1 (calc) (ref 1.0–2.5)
ALT: 11 U/L (ref 6–29)
AST: 38 U/L — AB (ref 10–35)
Albumin: 4 g/dL (ref 3.6–5.1)
Alkaline phosphatase (APISO): 65 U/L (ref 33–130)
BUN: 11 mg/dL (ref 7–25)
CALCIUM: 9.3 mg/dL (ref 8.6–10.4)
CO2: 25 mmol/L (ref 20–32)
Chloride: 103 mmol/L (ref 98–110)
Creat: 0.82 mg/dL (ref 0.50–0.99)
GFR, EST NON AFRICAN AMERICAN: 78 mL/min/{1.73_m2} (ref >=60)
GFR, Est African American: 90 mL/min/{1.73_m2} (ref >=60)
GLOBULIN: 3.6 g/dL (ref 1.9–3.7)
GLUCOSE: 79 mg/dL (ref 65–139)
Potassium: 4.1 mmol/L (ref 3.5–5.3)
Sodium: 135 mmol/L (ref 135–146)
Total Bilirubin: 0.7 mg/dL (ref 0.2–1.2)
Total Protein: 7.6 g/dL (ref 6.1–8.1)

## 2017-02-02 NOTE — Telephone Encounter (Signed)
Patient going to [facility] lab on [address] for labs. Please release orders. Patient going this pm.

## 2017-02-02 NOTE — Telephone Encounter (Signed)
Lab Orders released.  

## 2017-03-05 ENCOUNTER — Other Ambulatory Visit: Payer: Self-pay | Admitting: Rheumatology

## 2017-03-05 MED FILL — azaTHIOprine 50 MG TABS: 50 | 90 days supply | Qty: 135 | Fill #0

## 2017-03-05 NOTE — Telephone Encounter (Signed)
Last Visit: 12/12/16 Next Visit: 06/14/17 Labs: 02/02/17 AST is elevated but stable. ALT WNL. All other labs are WNL.  Okay to refill per Dr. Estanislado Pandy

## 2017-04-02 MED FILL — SYNTHROID 100 MCG TABLET: 100 | 90 days supply | Qty: 90 | Fill #4

## 2017-04-05 ENCOUNTER — Telehealth: Payer: Self-pay | Admitting: Rheumatology

## 2017-04-05 MED ORDER — HYDROXYCHLOROQUINE SULFATE 200 MG PO TABS: 200.0000 mg | ORAL_TABLET | Freq: Every day | ORAL | 0 refills | Status: DC

## 2017-04-05 MED FILL — HYDROXYCHLOROQUINE 200 MG: 200 | 30 days supply | Qty: 30 | Fill #0

## 2017-04-05 NOTE — Telephone Encounter (Signed)
Last Visit: 12/12/16 Next Visit: 06/14/17 Labs: 02/02/17 AST is elevated but stable. ALT WNL. All other labs are WNL. PLQ eye exam February 2018   Okay to refill 30 day supply per Dr. Estanislado Pandy

## 2017-04-05 NOTE — Telephone Encounter (Signed)
Patient left a voicemail requesting prescription refill of Plaquenil.  Patient states the pharmacy has not received the prescription and asked the patient to contact Dr. Arlean Hopping office.

## 2017-04-11 DIAGNOSIS — H524 Presbyopia: Secondary | ICD-10-CM | POA: Diagnosis not present

## 2017-04-11 DIAGNOSIS — H5213 Myopia, bilateral: Secondary | ICD-10-CM | POA: Diagnosis not present

## 2017-04-11 DIAGNOSIS — Z79899 Other long term (current) drug therapy: Secondary | ICD-10-CM | POA: Diagnosis not present

## 2017-04-11 DIAGNOSIS — H52223 Regular astigmatism, bilateral: Secondary | ICD-10-CM | POA: Diagnosis not present

## 2017-04-11 DIAGNOSIS — M3501 Sicca syndrome with keratoconjunctivitis: Secondary | ICD-10-CM | POA: Diagnosis not present

## 2017-05-01 ENCOUNTER — Other Ambulatory Visit: Payer: Self-pay | Admitting: *Deleted

## 2017-05-01 ENCOUNTER — Telehealth: Payer: Self-pay | Admitting: Rheumatology

## 2017-05-01 DIAGNOSIS — Z79899 Other long term (current) drug therapy: Secondary | ICD-10-CM

## 2017-05-01 NOTE — Telephone Encounter (Signed)
Lab orders released.  

## 2017-05-01 NOTE — Telephone Encounter (Signed)
Patient going to [facility] on [address] in the am. Please release orders.

## 2017-05-02 ENCOUNTER — Other Ambulatory Visit: Payer: Self-pay

## 2017-05-02 DIAGNOSIS — Z79899 Other long term (current) drug therapy: Secondary | ICD-10-CM

## 2017-05-02 LAB — COMPLETE METABOLIC PANEL WITH GFR
AG Ratio: 1.2 (calc) (ref 1.0–2.5)
ALKALINE PHOSPHATASE (APISO): 75 U/L (ref 33–130)
ALT: 11 U/L (ref 6–29)
AST: 40 U/L — AB (ref 10–35)
Albumin: 4.2 g/dL (ref 3.6–5.1)
BILIRUBIN TOTAL: 0.9 mg/dL (ref 0.2–1.2)
BUN: 12 mg/dL (ref 7–25)
CHLORIDE: 103 mmol/L (ref 98–110)
CO2: 26 mmol/L (ref 20–32)
Calcium: 9.2 mg/dL (ref 8.6–10.4)
Creat: 0.92 mg/dL (ref 0.50–0.99)
GFR, Est African American: 78 mL/min/{1.73_m2} (ref >=60)
GFR, Est Non African American: 67 mL/min/{1.73_m2} (ref >=60)
GLUCOSE: 90 mg/dL (ref 65–99)
Globulin: 3.5 g/dL (calc) (ref 1.9–3.7)
Potassium: 4.6 mmol/L (ref 3.5–5.3)
Sodium: 135 mmol/L (ref 135–146)
Total Protein: 7.7 g/dL (ref 6.1–8.1)

## 2017-05-02 LAB — CBC WITH DIFFERENTIAL/PLATELET
Basophils Absolute: 31 cells/uL (ref 0–200)
Basophils Relative: 0.9 %
EOS PCT: 1.2 %
Eosinophils Absolute: 41 cells/uL (ref 15–500)
HEMATOCRIT: 40.6 % (ref 35.0–45.0)
HEMOGLOBIN: 13.4 g/dL (ref 11.7–15.5)
LYMPHS ABS: 473 {cells}/uL — AB (ref 850–3900)
MCH: 28.4 pg (ref 27.0–33.0)
MCHC: 33 g/dL (ref 32.0–36.0)
MCV: 86 fL (ref 80.0–100.0)
MONOS PCT: 10.4 %
MPV: 9.5 fL (ref 7.5–12.5)
NEUTROS ABS: 2502 {cells}/uL (ref 1500–7800)
Neutrophils Relative %: 73.6 %
Platelets: 242 10*3/uL (ref 140–400)
RBC: 4.72 10*6/uL (ref 3.80–5.10)
RDW: 12.9 % (ref 11.0–15.0)
Total Lymphocyte: 13.9 %
WBC mixed population: 354 cells/uL (ref 200–950)
WBC: 3.4 10*3/uL — AB (ref 3.8–10.8)

## 2017-05-03 NOTE — Progress Notes (Signed)
LFTs are elevated but stable.  White cell count is low.  We will continue to monitor.  No change in therapy is advised.

## 2017-05-08 ENCOUNTER — Other Ambulatory Visit: Payer: Self-pay | Admitting: Rheumatology

## 2017-05-08 MED FILL — HYDROXYCHLOROQUINE SULFATE: 200 | 30 days supply | Qty: 30 | Fill #0

## 2017-05-08 NOTE — Telephone Encounter (Addendum)
Last Visit: 12/12/16 Next Visit: 06/14/17 Labs: 05/02/17 LFTs are elevated but stable. White cell count is low. We will continue to monitor.  PLQ Eye Exam: Patient states she had it done 04/2017 wnl. Will have sopy sent   Okay to refill 30 day supply per Dr. Estanislado Pandy

## 2017-05-11 ENCOUNTER — Ambulatory Visit (INDEPENDENT_AMBULATORY_CARE_PROVIDER_SITE_OTHER): Payer: 59 | Admitting: Obstetrics & Gynecology

## 2017-05-11 ENCOUNTER — Encounter: Payer: Self-pay | Admitting: Obstetrics & Gynecology

## 2017-05-11 VITALS — BP 128/72 | Ht 68.0 in | Wt 162.0 lb

## 2017-05-11 DIAGNOSIS — Z01419 Encounter for gynecological examination (general) (routine) without abnormal findings: Secondary | ICD-10-CM | POA: Diagnosis not present

## 2017-05-11 DIAGNOSIS — Z13820 Encounter for screening for osteoporosis: Secondary | ICD-10-CM | POA: Diagnosis not present

## 2017-05-11 DIAGNOSIS — Z780 Asymptomatic menopausal state: Secondary | ICD-10-CM | POA: Diagnosis not present

## 2017-05-11 DIAGNOSIS — M351 Other overlap syndromes: Secondary | ICD-10-CM | POA: Diagnosis not present

## 2017-05-11 NOTE — Progress Notes (Signed)
Jennifer Barr [birth year] [id number]   History:    62 y.o. [id number]  Married.  Mother died May 21, 2017.  1 daughter had a baby and 1 daughter got married in 05/21/2017.  RP:  Established patient presenting for annual gyn exam   HPI: Menopause, well on no HRT.  No PMB.  No pelvic pain.  No pain with intercourse.  Normal vaginal secretions.  Urine and bowel movements normal.  Breasts normal.  Body mass index 24.63.  Will follow up here for fasting health labs.  Past medical history,surgical history, family history and social history were all reviewed and documented in the EPIC chart.  Gynecologic History No LMP recorded. Patient is postmenopausal. Contraception: post menopausal status Last Pap: 02/2015. Results were: Negative. Last mammogram: 08/2016. Results were: Negative. Bone Density: No recent, will schedule here now. Colonoscopy: 11 yrs ago, will schedule now.  Obstetric History OB History  Gravida Para Term Preterm AB Living  4 3   1   3   SAB TAB Ectopic Multiple Live Births               # Outcome Date GA Lbr Len/2nd Weight Sex Delivery Anes PTL Lv  4 Gravida           3 Para           2 Para           1 Preterm              ROS: A ROS was performed and pertinent positives and negatives are included in the history.  GENERAL: No fevers or chills. HEENT: No change in vision, no earache, sore throat or sinus congestion. NECK: No pain or stiffness. CARDIOVASCULAR: No chest pain or pressure. No palpitations. PULMONARY: No shortness of breath, cough or wheeze. GASTROINTESTINAL: No abdominal pain, nausea, vomiting or diarrhea, melena or bright red blood per rectum. GENITOURINARY: No urinary frequency, urgency, hesitancy or dysuria. MUSCULOSKELETAL: No joint or muscle pain, no back pain, no recent trauma. DERMATOLOGIC: No rash, no itching, no lesions. ENDOCRINE: No polyuria, polydipsia, no heat or cold intolerance. No recent change in weight. HEMATOLOGICAL: No anemia or easy bruising or  bleeding. NEUROLOGIC: No headache, seizures, numbness, tingling or weakness. PSYCHIATRIC: No depression, no loss of interest in normal activity or change in sleep pattern.     Exam:   BP 128/72   Ht 5\' 8"  (1.727 m)   Wt 162 lb (73.5 kg)   BMI 24.63 kg/m   Body mass index is 24.63 kg/m.  General appearance : Well developed well nourished female. No acute distress HEENT: Eyes: no retinal hemorrhage or exudates,  Neck supple, trachea midline, no carotid bruits, no thyroidmegaly Lungs: Clear to auscultation, no rhonchi or wheezes, or rib retractions  Heart: Regular rate and rhythm, no murmurs or gallops Breast:Examined in sitting and supine position were symmetrical in appearance, no palpable masses or tenderness,  no skin retraction, no nipple inversion, no nipple discharge, no skin discoloration, no axillary or supraclavicular lymphadenopathy Abdomen: no palpable masses or tenderness, no rebound or guarding Extremities: no edema or skin discoloration or tenderness  Pelvic: Vulva: Normal             Vagina: No gross lesions or discharge  Cervix: No gross lesions or discharge.  Pap reflex done.  Uterus  RV, normal size, shape and consistency, non-tender and mobile  Adnexa  Without masses or tenderness  Anus: Normal   Assessment/Plan:  62 y.o. female for annual  exam   1. Encounter for routine gynecological examination with Papanicolaou smear of cervix Normal gynecologic exam.  Pap reflex done.  Breast exam normal.  Last screening mammogram negative in August 2018.  Will follow up here for fasting health labs.  Needs to schedule screening colonoscopy. - Lipid panel; Future - TSH; Future - VITAMIN D 25 Hydroxy (Vit-D Deficiency, Fractures); Future  2. Menopause present Well on no hormone replacement therapy.  No postmenopausal bleeding.  3. Screening for osteoporosis No recent bone density.  Will schedule here now.  Vitamin D supplements, calcium rich nutrition and regular  weightbearing physical activity recommended. - DG Bone Density; Future  4. Mixed connective tissue disease ([place]) Followed by Dr.Shaili Deveshwar.  Princess Bruins MD, 12:12 PM 05/11/2017

## 2017-05-12 ENCOUNTER — Encounter: Payer: Self-pay | Admitting: Obstetrics & Gynecology

## 2017-05-12 NOTE — Patient Instructions (Signed)
1. Encounter for routine gynecological examination with Papanicolaou smear of cervix Normal gynecologic exam.  Pap reflex done.  Breast exam normal.  Last screening mammogram negative in August 2018.  Will follow up here for fasting health labs.  Needs to schedule screening colonoscopy. - Lipid panel; Future - TSH; Future - VITAMIN D 25 Hydroxy (Vit-D Deficiency, Fractures); Future  2. Menopause present Well on no hormone replacement therapy.  No postmenopausal bleeding.  3. Screening for osteoporosis No recent bone density.  Will schedule here now.  Vitamin D supplements, calcium rich nutrition and regular weightbearing physical activity recommended. - DG Bone Density; Future  4. Mixed connective tissue disease ([place]) Followed by Dr.Shaili Deveshwar.  [place], it was a pleasure seeing you today!  I will inform you of your results as soon as they are available.

## 2017-05-14 ENCOUNTER — Other Ambulatory Visit: Payer: 59

## 2017-05-14 DIAGNOSIS — Z01419 Encounter for gynecological examination (general) (routine) without abnormal findings: Secondary | ICD-10-CM | POA: Diagnosis not present

## 2017-05-14 LAB — PAP IG W/ RFLX HPV ASCU

## 2017-05-15 ENCOUNTER — Encounter: Payer: Self-pay | Admitting: *Deleted

## 2017-05-15 LAB — LIPID PANEL
CHOLESTEROL: 171 mg/dL (ref <200)
HDL: 62 mg/dL (ref >50)
LDL Cholesterol (Calc): 96 mg/dL (calc)
Non-HDL Cholesterol (Calc): 109 mg/dL (calc) (ref <130)
Total CHOL/HDL Ratio: 2.8 (calc) (ref <5.0)
Triglycerides: 51 mg/dL (ref <150)

## 2017-05-15 LAB — TSH: TSH: 0.65 m[IU]/L (ref 0.40–4.50)

## 2017-05-15 LAB — VITAMIN D 25 HYDROXY (VIT D DEFICIENCY, FRACTURES): VIT D 25 HYDROXY: 39 ng/mL (ref 30–100)

## 2017-06-01 ENCOUNTER — Telehealth: Payer: Self-pay | Admitting: Rheumatology

## 2017-06-01 MED ORDER — AZATHIOPRINE 50 MG PO TABS: 75.0000 mg | ORAL_TABLET | Freq: Every day | ORAL | 0 refills | Status: DC

## 2017-06-01 MED ORDER — HYDROXYCHLOROQUINE SULFATE 200 MG PO TABS: 200.0000 mg | ORAL_TABLET | Freq: Every day | ORAL | 0 refills | Status: DC

## 2017-06-01 MED FILL — azaTHIOprine 50 MG TABS: 50 | 90 days supply | Qty: 135 | Fill #0

## 2017-06-01 MED FILL — HYDROXYCHLOROQUINE SULFATE: 200 | 90 days supply | Qty: 90 | Fill #0

## 2017-06-01 NOTE — Telephone Encounter (Signed)
Last Visit: 12/12/16 Next Visit: 06/14/17 Labs: 05/02/17 LFTs are elevated but stable. White cell count is low. We will continue to monitor.  PLQ Eye Exam: 04/11/17 WNL  Okay to refill per Dr. Estanislado Pandy

## 2017-06-01 NOTE — Telephone Encounter (Signed)
Patient requesting a refill on Plaquenil and Imuran 50mg  sent to [facility] for a three month supply if possible. Please call patient with any questions.

## 2017-06-05 DIAGNOSIS — M25531 Pain in right wrist: Secondary | ICD-10-CM | POA: Diagnosis not present

## 2017-06-06 NOTE — Progress Notes (Signed)
Office Visit Note  Patient: Jennifer Barr             Date of Birth: 12/04/1955           MRN: [id number]             PCP: Alycia Rossetti, MD Referring: Alycia Rossetti, MD Visit Date: 06/14/2017 Occupation: @GUAROCC @    Subjective:  Dry eyes.   History of Present Illness: Jennifer Barr is a 62 y.o. female with history of Sjogren's and osteoarthritis.  She continues to have sicca symptoms.  Her dry eyes are still quite bad.  She has not had any recent breakout of rash.  Raynauds is not as bad with the weather warming up.  She had fracture of the carpal bone in the past.  She states is been very painful.  She was seen by Dr. Percell Miller who injected with the cortisone.  She states she may need surgery in future if the symptoms do not resolve.  Activities of Daily Living:  Patient reports morning stiffness for 1 hour.   Patient Denies nocturnal pain.  Difficulty dressing/grooming: Denies Difficulty climbing stairs: Denies Difficulty getting out of chair: Denies Difficulty using hands for taps, buttons, cutlery, and/or writing: Denies   Review of Systems  Constitutional: Positive for fatigue. Negative for night sweats, weight gain and weight loss.  HENT: Positive for mouth dryness. Negative for mouth sores, trouble swallowing, trouble swallowing and nose dryness.   Eyes: Positive for dryness. Negative for pain, redness and visual disturbance.  Respiratory: Positive for difficulty breathing. Negative for cough and shortness of breath.   Cardiovascular: Negative for chest pain, palpitations, hypertension, irregular heartbeat and swelling in legs/feet.  Gastrointestinal: Negative for abdominal pain, blood in stool, constipation and diarrhea.  Endocrine: Negative for increased urination.  Genitourinary: Negative for pelvic pain and vaginal dryness.  Musculoskeletal: Positive for arthralgias, joint pain and morning stiffness. Negative for joint swelling, myalgias, muscle weakness, muscle  tenderness and myalgias.  Skin: Positive for rash. Negative for color change, hair loss, skin tightness, ulcers and sensitivity to sunlight.  Allergic/Immunologic: Negative for susceptible to infections.  Neurological: Negative for dizziness, light-headedness, headaches, memory loss, night sweats and weakness.  Hematological: Negative for bruising/bleeding tendency and swollen glands.  Psychiatric/Behavioral: Positive for sleep disturbance. Negative for depressed mood and confusion. The patient is not nervous/anxious.     PMFS History:  Patient Active Problem List   Diagnosis Date Noted  . ANA positive 02/10/2016  . Rheumatoid factor positive 02/10/2016  . Primary osteoarthritis, right hand 02/10/2016  . Scoliosis 02/10/2016  . Raynaud's syndrome without gangrene 02/10/2016  . Sjogren's disease ([place]) 05/07/2014  . Vasculitis ([place]) 05/07/2014  . Hashimoto's thyroiditis 05/07/2014  . Acute sinusitis 01/07/2013  . Bronchitis 01/07/2013  . Conjunctivitis 01/07/2013  . Nevus of skin of lip 01/07/2013  . Laryngitis 01/07/2013    Past Medical History:  Diagnosis Date  . Premature ovarian failure   . Sjoegren syndrome    Dr. Estanislado Pandy  . Thyroid disease    Hashiomto  . Vasculitis ([place])    Dr. Estanislado Pandy    Family History  Problem Relation Age of Onset  . Diabetes Mother   . Diabetes Father   . Scleroderma Daughter    History reviewed. No pertinent surgical history. Social History   Social History Narrative  . Not on file     Objective: Vital Signs: BP 129/66 (BP Location: Left Arm, Patient Position: Sitting, Cuff Size: Normal)   Pulse  67   Resp 15   Ht 5' 8.5" (1.74 m)   Wt 160 lb (72.6 kg)   BMI 23.97 kg/m    Physical Exam  Constitutional: She is oriented to person, place, and time. She appears well-developed and well-nourished.  HENT:  Head: Normocephalic and atraumatic.  Eyes: Conjunctivae and EOM are normal.  Neck: Normal range of motion.  Cardiovascular:  Normal rate, regular rhythm, normal heart sounds and intact distal pulses.  Pulmonary/Chest: Effort normal and breath sounds normal.  Abdominal: Soft. Bowel sounds are normal.  Lymphadenopathy:    She has no cervical adenopathy.  Neurological: She is alert and oriented to person, place, and time.  Skin: Skin is warm and dry. Capillary refill takes less than 2 seconds.  She has fine petechial rash over bilateral lower extremities.  Psychiatric: She has a normal mood and affect. Her behavior is normal.  Nursing note and vitals reviewed.    Musculoskeletal Exam: C-spine thoracic lumbar spine good range of motion.  Shoulder joints elbow joints wrist joint MCPs PIPs DIPs were in good range of motion.  She has tenderness over right CMC joint with no swelling.  Joints knee joints ankles MTPs PIPs been good range of motion.  CDAI Exam: No CDAI exam completed.    Investigation: No additional findings.PLQ eye exam: 04/11/2017 CBC Latest Ref Rng & Units 05/02/2017 02/02/2017 11/02/2016  WBC 3.8 - 10.8 Thousand/uL 3.4(L) 3.9 3.8  Hemoglobin 11.7 - 15.5 g/dL 13.4 12.0 12.6  Hematocrit 35.0 - 45.0 % 40.6 36.0 37.8  Platelets 140 - 400 Thousand/uL 242 268 267   CMP Latest Ref Rng & Units 05/02/2017 02/02/2017 11/02/2016  Glucose 65 - 99 mg/dL 90 79 82  BUN 7 - 25 mg/dL 12 11 15   Creatinine 0.50 - 0.99 mg/dL 0.92 0.82 0.96  Sodium 135 - 146 mmol/L 135 135 135  Potassium 3.5 - 5.3 mmol/L 4.6 4.1 4.7  Chloride 98 - 110 mmol/L 103 103 102  CO2 20 - 32 mmol/L 26 25 27   Calcium 8.6 - 10.4 mg/dL 9.2 9.3 9.3  Total Protein 6.1 - 8.1 g/dL 7.7 7.6 7.8  Total Bilirubin 0.2 - 1.2 mg/dL 0.9 0.7 0.5  Alkaline Phos 33 - 130 U/L - - -  AST 10 - 35 U/L 40(H) 38(H) 39(H)  ALT 6 - 29 U/L 11 11 9     Imaging: No results found.  Speciality Comments: PLQ Eye Exam: 04/11/17 WNL @ Marica Otter, OD PA    Procedures:  No procedures performed Allergies: Sulfa antibiotics   Assessment / Plan:     Visit Diagnoses:  Sjogren's syndrome with other organ involvement ([place]) - Positive ANA, positive Ro, positive La, positive RF -patient continues to have severe sicca symptoms with dry mouth and dry eyes.  No synovitis on examination.  I will check following labs with the next lab draw.  Plan: Rheumatoid factor, Serum protein electrophoresis with reflex,   Vasculitis ([facility])-she has had no new lesions.  She has some petechial rash on her bilateral lower extremities.  Raynaud's syndrome without gangrene-currently not very active.  High risk medication use - PLQ 200 mg by mouth daily, Imuran 75 mg by mouth daily.eye exam: 04/11/2017.  Her labs have been stable.  We will continue to monitor her labs every 3 months.  She is interested in discontinuing Plaquenil.  She may choose that option if she continues to do well.  Primary osteoarthritis of both hands-she has osteoarthritis in her bilateral hands and discomfort in her  right CMC joint.  She had recent cortisone injection by Dr. Percell Miller.  Scoliosis  History of hypothyroidism    Orders: Orders Placed This Encounter  Procedures  . Rheumatoid factor  . Serum protein electrophoresis with reflex   No orders of the defined types were placed in this encounter.    Follow-Up Instructions: Return in about 5 months (around 11/14/2017) for Sjogren's, vasculitis, Raynauds,, Osteoarthritis.   Bo Merino, MD  Note - This record has been created using Editor, commissioning.  Chart creation errors have been sought, but may not always  have been located. Such creation errors do not reflect on  the standard of medical care.

## 2017-06-14 ENCOUNTER — Ambulatory Visit (INDEPENDENT_AMBULATORY_CARE_PROVIDER_SITE_OTHER): Payer: 59 | Admitting: Rheumatology

## 2017-06-14 ENCOUNTER — Encounter: Payer: Self-pay | Admitting: Rheumatology

## 2017-06-14 VITALS — BP 129/66 | HR 67 | Resp 15 | Ht 68.5 in | Wt 160.0 lb

## 2017-06-14 DIAGNOSIS — M19041 Primary osteoarthritis, right hand: Secondary | ICD-10-CM

## 2017-06-14 DIAGNOSIS — I776 Arteritis, unspecified: Secondary | ICD-10-CM | POA: Diagnosis not present

## 2017-06-14 DIAGNOSIS — M3509 Sicca syndrome with other organ involvement: Secondary | ICD-10-CM

## 2017-06-14 DIAGNOSIS — I7300 Raynaud's syndrome without gangrene: Secondary | ICD-10-CM | POA: Diagnosis not present

## 2017-06-14 DIAGNOSIS — Z79899 Other long term (current) drug therapy: Secondary | ICD-10-CM | POA: Diagnosis not present

## 2017-06-14 DIAGNOSIS — Z8639 Personal history of other endocrine, nutritional and metabolic disease: Secondary | ICD-10-CM

## 2017-06-14 DIAGNOSIS — M419 Scoliosis, unspecified: Secondary | ICD-10-CM

## 2017-06-14 DIAGNOSIS — M19042 Primary osteoarthritis, left hand: Secondary | ICD-10-CM

## 2017-06-14 NOTE — Patient Instructions (Signed)
Standing Labs We placed an order today for your standing lab work.    Please come back and get your standing labs in July and every 3 months   We have open lab Monday through Friday from 8:30-11:30 AM and 1:30-4:00 PM  at the office of Dr. Maely Clements.   You may experience shorter wait times on Monday and Friday afternoons. The office is located at [address], [address], [place], [place] [address] No appointment is necessary.   Labs are drawn by [facility].  You may receive a bill from [facility] for your lab work. If you have any questions regarding directions or hours of operation,  please call [phone].    

## 2017-07-02 ENCOUNTER — Other Ambulatory Visit: Payer: Self-pay | Admitting: Gynecology

## 2017-07-02 DIAGNOSIS — Z13820 Encounter for screening for osteoporosis: Secondary | ICD-10-CM

## 2017-07-05 ENCOUNTER — Other Ambulatory Visit: Payer: Self-pay | Admitting: Rheumatology

## 2017-07-05 ENCOUNTER — Other Ambulatory Visit: Payer: Self-pay | Admitting: Obstetrics & Gynecology

## 2017-07-05 MED FILL — SYNTHROID 100 MCG TABLET: 100 | 90 days supply | Qty: 90 | Fill #0

## 2017-07-05 MED FILL — PILOCARPINE HCL 5 MG TABLET: 5 | 90 days supply | Qty: 270 | Fill #0

## 2017-07-05 NOTE — Telephone Encounter (Signed)
Last Visit: 06/14/17 Next Visit: 12/13/17  Okay to refill per Dr. Estanislado Pandy

## 2017-07-09 DIAGNOSIS — M8580 Other specified disorders of bone density and structure, unspecified site: Secondary | ICD-10-CM

## 2017-07-09 HISTORY — DX: Other specified disorders of bone density and structure, unspecified site: M85.80

## 2017-07-11 ENCOUNTER — Other Ambulatory Visit: Payer: Self-pay | Admitting: Gynecology

## 2017-07-11 ENCOUNTER — Ambulatory Visit (INDEPENDENT_AMBULATORY_CARE_PROVIDER_SITE_OTHER): Payer: 59

## 2017-07-11 DIAGNOSIS — M8589 Other specified disorders of bone density and structure, multiple sites: Secondary | ICD-10-CM | POA: Diagnosis not present

## 2017-07-11 DIAGNOSIS — Z13820 Encounter for screening for osteoporosis: Secondary | ICD-10-CM

## 2017-07-13 ENCOUNTER — Encounter: Payer: Self-pay | Admitting: Gynecology

## 2017-07-19 ENCOUNTER — Telehealth: Payer: Self-pay | Admitting: Rheumatology

## 2017-07-19 ENCOUNTER — Other Ambulatory Visit: Payer: Self-pay

## 2017-07-19 DIAGNOSIS — Z79899 Other long term (current) drug therapy: Secondary | ICD-10-CM | POA: Diagnosis not present

## 2017-07-19 DIAGNOSIS — M3509 Sicca syndrome with other organ involvement: Secondary | ICD-10-CM | POA: Diagnosis not present

## 2017-07-19 NOTE — Progress Notes (Signed)
Labs are stable.

## 2017-07-19 NOTE — Telephone Encounter (Signed)
Patient stopped by the office to have her labwork orders released to [facility] on [address] this morning.

## 2017-07-19 NOTE — Telephone Encounter (Signed)
Lab orders have been released.  ?

## 2017-07-23 LAB — COMPLETE METABOLIC PANEL WITH GFR
AG Ratio: 1.3 (calc) (ref 1.0–2.5)
ALBUMIN MSPROF: 4.4 g/dL (ref 3.6–5.1)
ALKALINE PHOSPHATASE (APISO): 71 U/L (ref 33–130)
ALT: 12 U/L (ref 6–29)
AST: 42 U/L — AB (ref 10–35)
BILIRUBIN TOTAL: 0.5 mg/dL (ref 0.2–1.2)
BUN: 15 mg/dL (ref 7–25)
CHLORIDE: 104 mmol/L (ref 98–110)
CO2: 26 mmol/L (ref 20–32)
CREATININE: 0.84 mg/dL (ref 0.50–0.99)
Calcium: 9.3 mg/dL (ref 8.6–10.4)
GFR, Est African American: 87 mL/min/{1.73_m2} (ref >=60)
GFR, Est Non African American: 75 mL/min/{1.73_m2} (ref >=60)
GLUCOSE: 89 mg/dL (ref 65–139)
Globulin: 3.3 g/dL (calc) (ref 1.9–3.7)
Potassium: 4.2 mmol/L (ref 3.5–5.3)
Sodium: 138 mmol/L (ref 135–146)
Total Protein: 7.7 g/dL (ref 6.1–8.1)

## 2017-07-23 LAB — RHEUMATOID FACTOR

## 2017-07-23 LAB — CBC WITH DIFFERENTIAL/PLATELET
BASOS PCT: 0.5 %
Basophils Absolute: 19 cells/uL (ref 0–200)
EOS PCT: 1.1 %
Eosinophils Absolute: 42 cells/uL (ref 15–500)
HCT: 37.3 % (ref 35.0–45.0)
Hemoglobin: 12.3 g/dL (ref 11.7–15.5)
Lymphs Abs: 566 cells/uL — ABNORMAL LOW (ref 850–3900)
MCH: 28.5 pg (ref 27.0–33.0)
MCHC: 33 g/dL (ref 32.0–36.0)
MCV: 86.5 fL (ref 80.0–100.0)
MONOS PCT: 8.5 %
MPV: 9.5 fL (ref 7.5–12.5)
Neutro Abs: 2850 cells/uL (ref 1500–7800)
Neutrophils Relative %: 75 %
PLATELETS: 258 10*3/uL (ref 140–400)
RBC: 4.31 10*6/uL (ref 3.80–5.10)
RDW: 13 % (ref 11.0–15.0)
TOTAL LYMPHOCYTE: 14.9 %
WBC: 3.8 10*3/uL (ref 3.8–10.8)
WBCMIX: 323 {cells}/uL (ref 200–950)

## 2017-07-23 LAB — PROTEIN ELECTROPHORESIS, SERUM, WITH REFLEX
ALBUMIN ELP: 4.1 g/dL (ref 3.8–4.8)
ALPHA 2: 0.8 g/dL (ref 0.5–0.9)
Alpha 1: 0.3 g/dL (ref 0.2–0.3)
BETA 2: 0.4 g/dL (ref 0.2–0.5)
BETA GLOBULIN: 0.5 g/dL (ref 0.4–0.6)
Gamma Globulin: 1.6 g/dL (ref 0.8–1.7)
Total Protein: 7.7 g/dL (ref 6.1–8.1)

## 2017-08-31 ENCOUNTER — Other Ambulatory Visit: Payer: Self-pay | Admitting: Rheumatology

## 2017-08-31 MED FILL — HYDROXYCHLOROQUINE SULFATE: 200 | 90 days supply | Qty: 90 | Fill #0

## 2017-08-31 MED FILL — azaTHIOprine 50 MG TABS: 50 | 90 days supply | Qty: 135 | Fill #0

## 2017-08-31 NOTE — Telephone Encounter (Signed)
Last Visit: 06/14/17 Next Visit: 12/13/17 Labs: 07/19/17 stable PLQ Eye Exam: 04/11/17 WNL   Okay to refill per Dr. Estanislado Pandy

## 2017-09-28 ENCOUNTER — Other Ambulatory Visit: Payer: Self-pay | Admitting: Obstetrics & Gynecology

## 2017-09-28 DIAGNOSIS — Z1231 Encounter for screening mammogram for malignant neoplasm of breast: Secondary | ICD-10-CM

## 2017-10-01 ENCOUNTER — Ambulatory Visit
Admission: RE | Admit: 2017-10-01 | Discharge: 2017-10-01 | Disposition: A | Discharge status: Home / Self Care | Payer: 59 | Source: Ambulatory Visit | Attending: Obstetrics & Gynecology | Admitting: Obstetrics & Gynecology

## 2017-10-01 DIAGNOSIS — Z1231 Encounter for screening mammogram for malignant neoplasm of breast: Secondary | ICD-10-CM | POA: Diagnosis not present

## 2017-10-01 MED FILL — SYNTHROID 100 MCG TABLET: 100 | 90 days supply | Qty: 90 | Fill #1

## 2017-10-17 ENCOUNTER — Telehealth: Payer: Self-pay | Admitting: Rheumatology

## 2017-10-17 NOTE — Telephone Encounter (Signed)
Patient called requesting labwork orders be sent to [facility] on [address].  Patient is going tomorrow morning 10/18/17.

## 2017-10-18 ENCOUNTER — Other Ambulatory Visit: Payer: Self-pay | Admitting: *Deleted

## 2017-10-18 DIAGNOSIS — Z79899 Other long term (current) drug therapy: Secondary | ICD-10-CM

## 2017-10-18 NOTE — Telephone Encounter (Signed)
Lab orders released.  

## 2017-10-19 LAB — COMPLETE METABOLIC PANEL WITH GFR
AG RATIO: 1.2 (calc) (ref 1.0–2.5)
ALKALINE PHOSPHATASE (APISO): 70 U/L (ref 33–130)
ALT: 8 U/L (ref 6–29)
AST: 34 U/L (ref 10–35)
Albumin: 4.3 g/dL (ref 3.6–5.1)
BILIRUBIN TOTAL: 0.3 mg/dL (ref 0.2–1.2)
BUN: 18 mg/dL (ref 7–25)
CALCIUM: 9.3 mg/dL (ref 8.6–10.4)
CHLORIDE: 102 mmol/L (ref 98–110)
CO2: 26 mmol/L (ref 20–32)
Creat: 0.98 mg/dL (ref 0.50–0.99)
GFR, EST NON AFRICAN AMERICAN: 62 mL/min/{1.73_m2} (ref >=60)
GFR, Est African American: 72 mL/min/{1.73_m2} (ref >=60)
GLOBULIN: 3.6 g/dL (ref 1.9–3.7)
Glucose, Bld: 85 mg/dL (ref 65–99)
POTASSIUM: 4.1 mmol/L (ref 3.5–5.3)
SODIUM: 134 mmol/L — AB (ref 135–146)
Total Protein: 7.9 g/dL (ref 6.1–8.1)

## 2017-10-19 LAB — CBC WITH DIFFERENTIAL/PLATELET
Basophils Absolute: 20 cells/uL (ref 0–200)
Basophils Relative: 0.5 %
EOS ABS: 28 {cells}/uL (ref 15–500)
Eosinophils Relative: 0.7 %
HEMATOCRIT: 38.3 % (ref 35.0–45.0)
HEMOGLOBIN: 12.7 g/dL (ref 11.7–15.5)
Lymphs Abs: 504 cells/uL — ABNORMAL LOW (ref 850–3900)
MCH: 28.5 pg (ref 27.0–33.0)
MCHC: 33.2 g/dL (ref 32.0–36.0)
MCV: 86.1 fL (ref 80.0–100.0)
MPV: 9.7 fL (ref 7.5–12.5)
Monocytes Relative: 8.9 %
Neutro Abs: 3092 cells/uL (ref 1500–7800)
Neutrophils Relative %: 77.3 %
Platelets: 269 10*3/uL (ref 140–400)
RBC: 4.45 10*6/uL (ref 3.80–5.10)
RDW: 12.7 % (ref 11.0–15.0)
Total Lymphocyte: 12.6 %
WBC mixed population: 356 cells/uL (ref 200–950)
WBC: 4 10*3/uL (ref 3.8–10.8)

## 2017-11-29 NOTE — Progress Notes (Deleted)
Office Visit Note  Patient: Jennifer Barr             Date of Birth: [birth year]           MRN: [id number]             PCP: Alycia Rossetti, MD Referring: Alycia Rossetti, MD Visit Date: 12/13/2017 Occupation: @GUAROCC @  Subjective:  No chief complaint on file.  Current regimen includes Imuran 75 mg daily and Plaquenil 200 mg daily along with pilocarpine 5 mg as needed.  Last Plaquenil eye exam normal on 04/11/2017.  Most recent CBC/CMP stable on 10/18/2017.  Standing orders are in place. Recommend flu, Pneumovax 23, Prevnar 13, and Shingrix as indicated.  Recommend yearly dental exam.    History of Present Illness: Jennifer Barr is a 62 y.o. female with history of Sjogren's, Raynaud's and osteoarthritis  Activities of Daily Living:  Patient reports morning stiffness for *** {minute/hour:19697}.   Patient {ACTIONS;DENIES/REPORTS:21021675::"Denies"} nocturnal pain.  Difficulty dressing/grooming: {ACTIONS;DENIES/REPORTS:21021675::"Denies"} Difficulty climbing stairs: {ACTIONS;DENIES/REPORTS:21021675::"Denies"} Difficulty getting out of chair: {ACTIONS;DENIES/REPORTS:21021675::"Denies"} Difficulty using hands for taps, buttons, cutlery, and/or writing: {ACTIONS;DENIES/REPORTS:21021675::"Denies"}  No Rheumatology ROS completed.   PMFS History:  Patient Active Problem List   Diagnosis Date Noted  . ANA positive 02/10/2016  . Rheumatoid factor positive 02/10/2016  . Primary osteoarthritis, right hand 02/10/2016  . Scoliosis 02/10/2016  . Raynaud's syndrome without gangrene 02/10/2016  . Sjogren's disease ([place]) 05/07/2014  . Vasculitis ([place]) 05/07/2014  . Hashimoto's thyroiditis 05/07/2014  . Acute sinusitis 01/07/2013  . Bronchitis 01/07/2013  . Conjunctivitis 01/07/2013  . Nevus of skin of lip 01/07/2013  . Laryngitis 01/07/2013    Past Medical History:  Diagnosis Date  . Osteopenia 07/2017   T score -1.3 FRAX 7.2% / 0.6%  . Premature ovarian failure   . Sjoegren  syndrome    Dr. Estanislado Pandy  . Thyroid disease    Hashiomto  . Vasculitis ([place])    Dr. Estanislado Pandy    Family History  Problem Relation Age of Onset  . Diabetes Mother   . Diabetes Father   . Scleroderma Daughter    No past surgical history on file. Social History   Social History Narrative  . Not on file    Objective: Vital Signs: There were no vitals taken for this visit.   Physical Exam   Musculoskeletal Exam: ***  CDAI Exam: CDAI Score: Not documented Patient Global Assessment: Not documented; Provider Global Assessment: Not documented Swollen: Not documented; Tender: Not documented Joint Exam   Not documented   There is currently no information documented on the homunculus. Go to the Rheumatology activity and complete the homunculus joint exam.  Investigation: No additional findings.  Imaging: No results found.  Recent Labs: Lab Results  Component Value Date   WBC 4.0 10/18/2017   HGB 12.7 10/18/2017   PLT 269 10/18/2017   NA 134 (L) 10/18/2017   K 4.1 10/18/2017   CL 102 10/18/2017   CO2 26 10/18/2017   GLUCOSE 85 10/18/2017   BUN 18 10/18/2017   CREATININE 0.98 10/18/2017   BILITOT 0.3 10/18/2017   ALKPHOS 79 08/03/2016   AST 34 10/18/2017   ALT 8 10/18/2017   PROT 7.9 10/18/2017   ALBUMIN 4.2 08/03/2016   CALCIUM 9.3 10/18/2017   GFRAA 72 10/18/2017    Speciality Comments: PLQ Eye Exam: 04/11/17 WNL @ Marica Otter, OD PA  Procedures:  No procedures performed Allergies: Sulfa antibiotics   Assessment / Plan:  Visit Diagnoses: Sjogren's syndrome with other organ involvement ([employer]) - Positive ANA, positive Ro, positive La, positive RF   Vasculitis ([place]) - petechial rash on her bilateral lower extremities.  High risk medication use - PLQ 200 mg by mouth daily, Imuran 75 mg by mouth daily.eye exam: 04/11/2017  Raynaud's syndrome without gangrene  Primary osteoarthritis of both hands  Scoliosis  History of hypothyroidism  Hashimoto's  thyroiditis   Orders: No orders of the defined types were placed in this encounter.  No orders of the defined types were placed in this encounter.   Face-to-face time spent with patient was *** minutes. Greater than 50% of time was spent in counseling and coordination of care.  Follow-Up Instructions: No follow-ups on file.   Ofilia Neas, PA-C  Note - This record has been created using Dragon software.  Chart creation errors have been sought, but may not always  have been located. Such creation errors do not reflect on  the standard of medical care.

## 2017-12-02 ENCOUNTER — Other Ambulatory Visit: Payer: Self-pay

## 2017-12-02 ENCOUNTER — Ambulatory Visit (HOSPITAL_COMMUNITY)
Admission: EM | Admit: 2017-12-02 | Discharge: 2017-12-02 | Disposition: A | Discharge status: Home / Self Care | Payer: 59 | Attending: Family Medicine | Admitting: Family Medicine

## 2017-12-02 ENCOUNTER — Encounter (HOSPITAL_COMMUNITY): Payer: Self-pay

## 2017-12-02 DIAGNOSIS — K115 Sialolithiasis: Secondary | ICD-10-CM

## 2017-12-02 MED ORDER — HYDROCODONE-ACETAMINOPHEN 7.5-325 MG PO TABS: 1.0000 | ORAL_TABLET | Freq: Four times a day (QID) | ORAL | 0 refills | Status: DC | PRN

## 2017-12-02 MED ORDER — IBUPROFEN 800 MG PO TABS: 800.0000 mg | ORAL_TABLET | Freq: Three times a day (TID) | ORAL | 0 refills | Status: DC

## 2017-12-02 MED ORDER — LIDOCAINE HCL (PF) 1 % IJ SOLN
INTRAMUSCULAR | Status: AC
  Filled 2017-12-02: qty 2

## 2017-12-02 MED ORDER — ONDANSETRON HCL 4 MG PO TABS: 4.0000 mg | ORAL_TABLET | Freq: Four times a day (QID) | ORAL | 0 refills | Status: DC

## 2017-12-02 MED ORDER — CEFTRIAXONE SODIUM 1 G IJ SOLR
INTRAMUSCULAR | Status: AC
  Filled 2017-12-02: qty 10

## 2017-12-02 MED ORDER — CEFTRIAXONE SODIUM 1 G IJ SOLR
1.0000 g | Freq: Once | INTRAMUSCULAR | Status: AC
  Administered 2017-12-02: 1 g via INTRAMUSCULAR

## 2017-12-02 MED ORDER — AMOXICILLIN-POT CLAVULANATE 875-125 MG PO TABS: 1.0000 | ORAL_TABLET | Freq: Two times a day (BID) | ORAL | 0 refills | Status: DC

## 2017-12-02 MED ORDER — IPRATROPIUM-ALBUTEROL 0.5-2.5 (3) MG/3ML IN SOLN: 3.0000 mL | Freq: Once | RESPIRATORY_TRACT | Status: DC

## 2017-12-02 NOTE — ED Provider Notes (Signed)
[place]    CSN: [id number] Arrival date & time: 12/02/17  1755     History   Chief Complaint Chief Complaint  Patient presents with  . swollen lymph nodes/ear pain/sore    HPI Jennifer Barr is a 62 y.o. female.   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  She has been sick for 2 days.  She noticed some swelling in front of her left ear yesterday.  Is been getting progressively more swollen and more painful.  She is had some fever and chills.  No cough cold runny nose.  No difficulty swallowing.  She has some ear pain.  She is never had this before.  She is dehydrated.  She states she is not eating or drinking.  Because of the pain.  She states that she does have trouble making saliva because of her Sjogren's syndrome.  She states that she is immune compromised because of her medications.  She is under the care of rheumatology.  She has been taking ibuprofen 200 mg every few hours for the pain.  In spite of this she states the pain is significant.  She states she has had a decreased appetite and some nausea.  No vomiting.  She is here with her husband.  Past Medical History:  Diagnosis Date  . Osteopenia 07/2017   T score -1.3 FRAX 7.2% / 0.6%  . Premature ovarian failure   . Sjoegren syndrome    Dr. Estanislado Pandy  . Thyroid disease    Hashiomto  . Vasculitis ([place])    Dr. Estanislado Pandy    Patient Active Problem List   Diagnosis Date Noted  . ANA positive 02/10/2016  . Rheumatoid factor positive 02/10/2016  . Primary osteoarthritis, right hand 02/10/2016  . Scoliosis 02/10/2016  . Raynaud's syndrome without gangrene 02/10/2016  . Sjogren's disease ([place]) 05/07/2014  . Vasculitis ([place]) 05/07/2014  . Hashimoto's thyroiditis 05/07/2014  . Acute sinusitis 01/07/2013  . Bronchitis 01/07/2013  . Conjunctivitis 01/07/2013  . Nevus of skin of lip 01/07/2013  . Laryngitis 01/07/2013    History reviewed. No pertinent surgical history.  OB  History    Gravida  4   Para  3   Term      Preterm  1   AB      Living  3     SAB      TAB      Ectopic      Multiple      Live Births               Home Medications    Prior to Admission medications   Medication Sig Start Date End Date Taking? Authorizing Provider  amoxicillin-clavulanate (AUGMENTIN) 875-125 MG tablet Take 1 tablet by mouth every 12 (twelve) hours. 12/02/17   Raylene Everts, MD  aspirin 81 MG tablet Take 81 mg by mouth daily.    [provider]  azaTHIOprine (IMURAN) 50 MG tablet TAKE 1 & 1/2 TABLETS (75 MG TOTAL) BY MOUTH DAILY. 08/31/17   Bo Merino, MD  HYDROcodone-acetaminophen (NORCO) 7.5-325 MG tablet Take 1 tablet by mouth every 6 (six) hours as needed for severe pain. 12/02/17   Raylene Everts, MD  hydroxychloroquine (PLAQUENIL) 200 MG tablet TAKE 1 TABLET (200 MG TOTAL) BY MOUTH DAILY. 08/31/17   Bo Merino, MD  ibuprofen (ADVIL,MOTRIN) 800 MG tablet Take 1 tablet (800 mg total) by mouth 3 (three) times daily. 12/02/17   Blanchie Serve  Collie Siad, MD  ondansetron (ZOFRAN) 4 MG tablet Take 1 tablet (4 mg total) by mouth every 6 (six) hours. 12/02/17   Raylene Everts, MD  pilocarpine (SALAGEN) 5 MG tablet TAKE 1 TABLET BY MOUTH 3 TIMES DAILY 07/05/17   Bo Merino, MD  SYNTHROID 100 MCG tablet TAKE 1 TABLET BY MOUTH DAILY BEFORE BREAKFAST 07/05/17   Princess Bruins, MD    Family History Family History  Problem Relation Age of Onset  . Diabetes Mother   . Diabetes Father   . Scleroderma Daughter     Social History Social History   Tobacco Use  . Smoking status: Never Smoker  . Smokeless tobacco: Never Used  Substance Use Topics  . Alcohol use: Yes    Comment: social  . Drug use: No     Allergies   Sulfa antibiotics   Review of Systems Review of Systems  Constitutional: Negative for chills and fever.  HENT: Positive for ear pain and facial swelling. Negative for sore throat.   Eyes:  Negative for pain and visual disturbance.  Respiratory: Negative for cough and shortness of breath.   Cardiovascular: Negative for chest pain and palpitations.  Gastrointestinal: Negative for abdominal pain and vomiting.  Genitourinary: Negative for dysuria and hematuria.  Musculoskeletal: Negative for arthralgias and back pain.  Skin: Negative for color change and rash.  Neurological: Negative for seizures and syncope.  Psychiatric/Behavioral: Positive for sleep disturbance. The patient is not nervous/anxious.   All other systems reviewed and are negative.    Physical Exam Triage Vital Signs ED Triage Vitals  Enc Vitals Group     BP 12/02/17 1845 109/60     Pulse Rate 12/02/17 1845 79     Resp 12/02/17 1845 18     Temp 12/02/17 1845 98.9 F (37.2 C)     Temp src --      SpO2 12/02/17 1845 98 %     Weight 12/02/17 1842 152 lb (68.9 kg)     Height --      Head Circumference --      Peak Flow --      Pain Score 12/02/17 1842 10     Pain Loc --      Pain Edu? --      Excl. in [place]? --    No data found.  Updated Vital Signs BP 109/60 (BP Location: Left Arm)   Pulse 79   Temp 98.9 F (37.2 C)   Resp 18   Wt 68.9 kg   SpO2 98%   BMI 22.78 kg/m      Physical Exam  Constitutional: She appears well-developed and well-nourished. No distress.  Appears uncomfortable.  Appears tired  HENT:  Head: Normocephalic and atraumatic.    Right Ear: External ear normal.  Left Ear: External ear normal.  Mouth/Throat: Mucous membranes are dry.  Cracks at corner of mouth.  Dry mouth.  Dry eyes.  Eyes: Pupils are equal, round, and reactive to light. Conjunctivae are normal.  Neck: Normal range of motion.  Cardiovascular: Normal rate, regular rhythm and normal heart sounds.  Pulmonary/Chest: Effort normal and breath sounds normal. No respiratory distress.  Abdominal: Soft. She exhibits no distension.  Musculoskeletal: Normal range of motion. She exhibits no edema.  Lymphadenopathy:      She has no cervical adenopathy.  Neurological: She is alert.  Skin: Skin is warm and dry.  Psychiatric: She has a normal mood and affect. Her behavior is normal.     UC Treatments /  Results  Labs (all labs ordered are listed, but only abnormal results are displayed) Labs Reviewed - No data to display  EKG None  Radiology No results found.  Procedures Procedures (including critical care time)  Medications Ordered in UC Medications  cefTRIAXone (ROCEPHIN) injection 1 g (1 g Intramuscular Given 12/02/17 1912)    Initial Impression / Assessment and Plan / UC Course  I have reviewed the triage vital signs and the nursing notes.  Pertinent labs & imaging results that were available during my care of the patient were reviewed by me and considered in my medical decision making (see chart for details).    Discussed that I believe she has a stone obstructing her salivary gland.  I told her these are not unusual, further with her Sjogren's syndrome, dry mouth, dehydration these would exacerbate.  With a stone, the symptoms will become infected.  This would explain fever and chills.  Will be treated with antibiotics.  Start with a gram of Rocephin.  Have her take Augmentin twice a day.  I am giving her medicine for pain management.  She must push fluids.  She should call ENT in the morning for follow-up. Final Clinical Impressions(s) / UC Diagnoses   Final diagnoses:  Salivary duct calculus     Discharge Instructions     Take Zofran 2-3 times a day for nausea You must increase your fluids Take the Augmentin antibiotic 2 times a day Take ibuprofen 3 times a day with food.  This is for mild to moderate pain. Take Norco for severe pain. Take Norco with food, this can cause drowsiness, this can cause constipation.  Do not drive on [place] Follow-up with an ENT physician.  Call in the morning [facility] [phone]   ED Prescriptions    Medication Sig Dispense Auth.  Provider   amoxicillin-clavulanate (AUGMENTIN) 875-125 MG tablet Take 1 tablet by mouth every 12 (twelve) hours. 14 tablet Raylene Everts, MD   HYDROcodone-acetaminophen [facility]) 7.5-325 MG tablet Take 1 tablet by mouth every 6 (six) hours as needed for severe pain. 20 tablet Raylene Everts, MD   ibuprofen (ADVIL,MOTRIN) 800 MG tablet Take 1 tablet (800 mg total) by mouth 3 (three) times daily. 21 tablet Raylene Everts, MD   ondansetron (ZOFRAN) 4 MG tablet Take 1 tablet (4 mg total) by mouth every 6 (six) hours. 12 tablet Raylene Everts, MD     Controlled Substance Prescriptions [place] Controlled Substance Registry consulted? Not Applicable   Raylene Everts, MD 12/02/17 Kathyrn Drown

## 2017-12-02 NOTE — Discharge Instructions (Addendum)
Take Zofran 2-3 times a day for nausea You must increase your fluids Take the Augmentin antibiotic 2 times a day Take ibuprofen 3 times a day with food.  This is for mild to moderate pain. Take Norco for severe pain. Take Norco with food, this can cause drowsiness, this can cause constipation.  Do not drive on [place] Follow-up with an ENT physician.  Call in the morning [facility] [phone]

## 2017-12-02 NOTE — ED Triage Notes (Signed)
Pt cc swollen lymph nodes/ear pain/sore  This started las night around 10 pm

## 2017-12-03 ENCOUNTER — Other Ambulatory Visit: Payer: Self-pay | Admitting: Rheumatology

## 2017-12-03 DIAGNOSIS — K1121 Acute sialoadenitis: Secondary | ICD-10-CM | POA: Diagnosis not present

## 2017-12-03 DIAGNOSIS — M3500 Sicca syndrome, unspecified: Secondary | ICD-10-CM | POA: Diagnosis not present

## 2017-12-03 MED FILL — azaTHIOprine 50 MG TABS: 50 | 90 days supply | Qty: 135 | Fill #0

## 2017-12-03 MED FILL — CEPHALEXIN 500 MG CAPSULE: 500 | 10 days supply | Qty: 30 | Fill #0

## 2017-12-03 MED FILL — HYDROXYCHLOROQUINE SULFATE: 200 | 90 days supply | Qty: 90 | Fill #0

## 2017-12-03 NOTE — Telephone Encounter (Signed)
Last Visit: 06/14/2017 Next Visit: 12/13/2017 Labs: 10/18/2017 stable  Eye exam: 04/11/2017  Okay to refill per Dr. Estanislado Pandy.

## 2017-12-05 MED FILL — PEG-3350 SOLUTION: 420 | 1 days supply | Qty: 4000 | Fill #0

## 2017-12-13 ENCOUNTER — Ambulatory Visit: Payer: 59 | Admitting: Rheumatology

## 2017-12-13 ENCOUNTER — Ambulatory Visit: Payer: 59 | Admitting: Physician Assistant

## 2017-12-14 DIAGNOSIS — Z1211 Encounter for screening for malignant neoplasm of colon: Secondary | ICD-10-CM | POA: Diagnosis not present

## 2017-12-20 NOTE — Progress Notes (Signed)
Office Visit Note  Patient: Jennifer Barr             Date of Birth: [birth year]           MRN: [id number]             PCP: Alycia Rossetti, MD Referring: Alycia Rossetti, MD Visit Date: 01/03/2018 Occupation: @GUAROCC @  Subjective:  Sicca symptoms   History of Present Illness: Jennifer Barr is a 62 y.o. female with history of Sjogren's and osteoarthritis. She is taking plaquenil 200 mg 1 tablet by mouth daily and Imuran 75 po daily.  Patient reports that after her last visit she was told that she was able to stop Plaquenil if she wanted but she has continued.  She is curious if she can discontinue at this point.  Patient was seen in the emergency room on 12/02/2017 for a salivary stone in the left parotid gland.  She states she was treated with 1 g of Rocephin and Augmentin twice daily.  She was taking Norco for pain relief and was referred to ENT who evaluated her the following day.  They recommended hydration as well as eating lemon drops occasionally.  She takes pilocarpine 5 mg 1 to 2 tablets/day.  She does not feel as though they are effective but has not experienced any side effects.  She continues to go to the dentist every 6 months and denies any recent dental cavities.  She reports that she has not had any symptoms of Raynaud's recently denies any digital ulcerations.  She states she continues have pain in both hands and intermittent joint swelling. She reports that she is currently being treated for bronchitis.  She states she was started on prednisone as well as a Z-Pak.  She continues to have a dry cough.   Activities of Daily Living:  Patient reports morning stiffness for 5-10 minutes.   Patient Denies nocturnal pain.  Difficulty dressing/grooming: Denies Difficulty climbing stairs: Denies Difficulty getting out of chair: Denies Difficulty using hands for taps, buttons, cutlery, and/or writing: Reports  Review of Systems  Constitutional: Positive for fatigue.  HENT:  Positive for mouth dryness. Negative for mouth sores, trouble swallowing, trouble swallowing and nose dryness.   Eyes: Positive for dryness. Negative for pain, redness, itching and visual disturbance.  Respiratory: Negative for cough, hemoptysis, shortness of breath, wheezing and difficulty breathing.   Cardiovascular: Negative for chest pain, palpitations, hypertension, irregular heartbeat and swelling in legs/feet.  Gastrointestinal: Negative for abdominal pain, blood in stool, constipation, diarrhea, nausea and vomiting.  Endocrine: Negative for increased urination.  Genitourinary: Negative for painful urination, nocturia and pelvic pain.  Musculoskeletal: Positive for morning stiffness. Negative for arthralgias, joint pain, joint swelling, myalgias, muscle weakness, muscle tenderness and myalgias.  Skin: Negative for color change, pallor, rash, hair loss, nodules/bumps, skin tightness, ulcers and sensitivity to sunlight.  Allergic/Immunologic: Negative for susceptible to infections.  Neurological: Negative for dizziness, light-headedness, numbness, headaches, memory loss and weakness.  Hematological: Negative for bruising/bleeding tendency and swollen glands.  Psychiatric/Behavioral: Negative for depressed mood, confusion and sleep disturbance. The patient is not nervous/anxious.     PMFS History:  Patient Active Problem List   Diagnosis Date Noted  . ANA positive 02/10/2016  . Rheumatoid factor positive 02/10/2016  . Primary osteoarthritis, right hand 02/10/2016  . Scoliosis 02/10/2016  . Raynaud's syndrome without gangrene 02/10/2016  . Sjogren's disease ([place]) 05/07/2014  . Vasculitis ([place]) 05/07/2014  . Hashimoto's thyroiditis 05/07/2014  . Acute sinusitis  01/07/2013  . Bronchitis 01/07/2013  . Conjunctivitis 01/07/2013  . Nevus of skin of lip 01/07/2013  . Laryngitis 01/07/2013    Past Medical History:  Diagnosis Date  . Osteopenia 07/2017   T score -1.3 FRAX 7.2% / 0.6%    . Premature ovarian failure   . Sjoegren syndrome    Dr. Estanislado Pandy  . Thyroid disease    Hashiomto  . Vasculitis ([place])    Dr. Estanislado Pandy    Family History  Problem Relation Age of Onset  . Diabetes Mother   . Diabetes Father   . Scleroderma Daughter    History reviewed. No pertinent surgical history. Social History   Social History Narrative  . Not on file    There is no immunization history on file for this patient.   Objective: Vital Signs: BP 115/73 (BP Location: Left Arm, Patient Position: Sitting, Cuff Size: Normal)   Pulse 86   Resp 13   Ht 5\' 9"  (1.753 m)   Wt 156 lb 12.8 oz (71.1 kg)   BMI 23.16 kg/m    Physical Exam Vitals signs and nursing note reviewed.  Constitutional:      Appearance: She is well-developed.  HENT:     Head: Normocephalic and atraumatic.     Comments: No parotid swelling or tenderness at this time. Eyes:     Conjunctiva/sclera: Conjunctivae normal.  Neck:     Musculoskeletal: Normal range of motion.  Cardiovascular:     Rate and Rhythm: Normal rate and regular rhythm.     Heart sounds: Normal heart sounds.  Pulmonary:     Effort: Pulmonary effort is normal.     Breath sounds: Normal breath sounds.  Abdominal:     General: Bowel sounds are normal.     Palpations: Abdomen is soft.  Lymphadenopathy:     Cervical: No cervical adenopathy.  Skin:    General: Skin is warm and dry.     Capillary Refill: Capillary refill takes less than 2 seconds.     Comments: No digital ulcerations or signs of gangrene noted.  Neurological:     Mental Status: She is alert and oriented to person, place, and time.  Psychiatric:        Behavior: Behavior normal.      Musculoskeletal Exam: C-spine, thoracic spine, lumbar spine good range of motion.  No midline spinal tenderness.  No SI joint tenderness.  Shoulder joints, elbow joints, wrist joints, MCPs and PIPs, DIPs good range of motion with no synovitis.  She has PIP and DIP synovial thickening  consistent with osteoarthritis of bilateral hands.  She has synovial thickening and tenderness of bilateral CMC joints.  Hip joints, knee joints, ankle joints, MTPs and PIPs, DIPs good range of motion with no synovitis.  No warmth or effusion of bilateral knee joints.  No tenderness or swelling of ankle joints.  No tenderness over trochanteric bursa bilaterally.    CDAI Exam: CDAI Score: Not documented Patient Global Assessment: Not documented; Provider Global Assessment: Not documented Swollen: Not documented; Tender: Not documented Joint Exam   Not documented   There is currently no information documented on the homunculus. Go to the Rheumatology activity and complete the homunculus joint exam.  Investigation: No additional findings.  Imaging: No results found.  Recent Labs: Lab Results  Component Value Date   WBC 4.0 10/18/2017   HGB 12.7 10/18/2017   PLT 269 10/18/2017   NA 134 (L) 10/18/2017   K 4.1 10/18/2017   CL 102 10/18/2017  CO2 26 10/18/2017   GLUCOSE 85 10/18/2017   BUN 18 10/18/2017   CREATININE 0.98 10/18/2017   BILITOT 0.3 10/18/2017   ALKPHOS 79 08/03/2016   AST 34 10/18/2017   ALT 8 10/18/2017   PROT 7.9 10/18/2017   ALBUMIN 4.2 08/03/2016   CALCIUM 9.3 10/18/2017   GFRAA 72 10/18/2017    Speciality Comments: PLQ Eye Exam: 04/11/17 WNL @ Marica Otter, OD PA  Procedures:  No procedures performed Allergies: Sulfa antibiotics      Assessment / Plan:     Visit Diagnoses: Sjogren's syndrome with other organ involvement ([place]) - Positive ANA, positive Ro, positive La, positive RF: She continues to have severe sicca symptoms.  She was evaluated in the emergency room on 12/02/2017 for a left parotid gland salivary stone.  She was treated with 1 g of Rocephin and Augmentin twice daily as well as Norco for pain relief.  She was evaluated by ENT who recommended increasing hydration as well as taking lemon drops occasionally.  She continues to take pilocarpine 5  mg 1 to 2 tablets by mouth daily.  She is currently on Plaquenil 200 mg 1 tablet by mouth daily and Imuran 75 mg by mouth daily.  She would like to discontinue Plaquenil at this time.  She is apprehensive about the side effects of Plaquenil.  We discussed that if she has worsening sicca symptoms or recurrence of vasculitis that she should resume Plaquenil.  We discussed following up in the office in 3 months to evaluate but she declined it would like to return in 5 months.  She was advised to notify us if she develops any new or worsening symptoms.  Vasculitis ([place]): She has not had any recent new lesions.  She occasionally gets a petechial rash on bilateral lower extremities.  She will continue on Imuran 75 mg by mouth daily.  If she has any recurrences we will discuss increasing the dose of Imuran.  Raynaud's syndrome without gangrene: She has not had any recent symptoms of Raynaud's.  She has no digital ulcerations or signs of gangrene.  High risk medication use -  PLQ 200 mg by mouth daily, Imuran 75 mg by mouth daily.eye exam: 04/11/2017.  CBC and CMP will be drawn today to monitor for drug toxicity.  She will return in March and every 3 months for lab work.  She has had the seasonal influenza vaccination.- Plan: CBC with Differential/Platelet, COMPLETE METABOLIC PANEL WITH GFR  Primary osteoarthritis of both hands: She has PIP and DIP synovial thickening consistent with osteoarthritis of bilateral hands.  She has no synovitis.  She has complete fist formation bilaterally.  Joint protection and muscle strengthening were discussed.  Scoliosis: She has no back pain at this time.   History of hypothyroidism   Orders: Orders Placed This Encounter  Procedures  . CBC with Differential/Platelet  . COMPLETE METABOLIC PANEL WITH GFR   No orders of the defined types were placed in this encounter.   Face-to-face time spent with patient was 30 minutes. Greater than 50% of time was spent in counseling  and coordination of care.  Follow-Up Instructions: Return in about 5 months (around 06/04/2018) for Sjogren's syndrome, Vasculitis, Raynaud's syndrome.   Jennifer Neas, PA-C   I examined and evaluated the patient with Hazel Sams PA.  Patient had no rash or synovitis on examination.  She continues to have severe sicca symptoms.  She wants to come off Plaquenil.  We had detailed discussion regarding that.  I have advised her to monitor closely if she has recurrence of rash that she will have to go back on Plaquenil or we will have to increase her dose of Imuran.  The plan of care was discussed as noted above.  Bo Merino, MD Note - This record has been created using Editor, commissioning.  Chart creation errors have been sought, but may not always  have been located. Such creation errors do not reflect on  the standard of medical care.

## 2017-12-27 ENCOUNTER — Ambulatory Visit (INDEPENDENT_AMBULATORY_CARE_PROVIDER_SITE_OTHER): Payer: 59 | Admitting: Family Medicine

## 2017-12-27 VITALS — BP 124/70 | HR 73 | Temp 98.3°F | Wt 157.0 lb

## 2017-12-27 DIAGNOSIS — J209 Acute bronchitis, unspecified: Secondary | ICD-10-CM

## 2017-12-27 DIAGNOSIS — R109 Unspecified abdominal pain: Secondary | ICD-10-CM

## 2017-12-27 LAB — URINALYSIS, ROUTINE W REFLEX MICROSCOPIC
Bilirubin Urine: NEGATIVE
GLUCOSE, UA: NEGATIVE
HGB URINE DIPSTICK: NEGATIVE
KETONES UR: NEGATIVE
LEUKOCYTES UA: NEGATIVE
Nitrite: NEGATIVE
PROTEIN: NEGATIVE
Specific Gravity, Urine: 1.01 (ref 1.001–1.03)
pH: 6.5 (ref 5.0–8.0)

## 2017-12-27 MED ORDER — AZITHROMYCIN 250 MG PO TABS: ORAL_TABLET | ORAL | 0 refills | Status: DC

## 2017-12-27 MED ORDER — ALBUTEROL SULFATE 108 (90 BASE) MCG/ACT IN AEPB: 2.0000 | INHALATION_SPRAY | RESPIRATORY_TRACT | 0 refills | Status: DC | PRN

## 2017-12-27 MED ORDER — PREDNISONE 20 MG PO TABS: 40.0000 mg | ORAL_TABLET | Freq: Every day | ORAL | 0 refills | Status: AC

## 2017-12-27 MED ORDER — BENZONATATE 100 MG PO CAPS: 100.0000 mg | ORAL_CAPSULE | Freq: Three times a day (TID) | ORAL | 0 refills | Status: DC | PRN

## 2017-12-27 MED FILL — PROAIR RESPICLICK INHAL PWD: 108 (90 BAS | 17 days supply | Qty: 1 | Fill #0

## 2017-12-27 MED FILL — BENZONATATE 100 MG CAPS: 100 | 10 days supply | Qty: 30 | Fill #0

## 2017-12-27 MED FILL — predniSONE 20 MG TABS: 20 | 5 days supply | Qty: 10 | Fill #0

## 2017-12-27 MED FILL — SYNTHROID 100 MCG TABLET: 100 | 90 days supply | Qty: 90 | Fill #2

## 2017-12-27 MED FILL — AZITHROMYCIN 250 MG TABLET: 250 | 5 days supply | Qty: 6 | Fill #0

## 2017-12-27 NOTE — Progress Notes (Signed)
Patient ID: Jennifer Barr, female    DOB: 09-05-1955, 62 y.o.   MRN: [id number]  PCP: Alycia Rossetti, MD  Chief Complaint  Patient presents with  . Cough    Patient has c/o possible bronchitis. Has c/o non productive cough. Onset of symptoms 1 week ago. Patient also c/o  pain in kidney    Subjective:   Jennifer Barr is a 62 y.o. female, presents to clinic with CC of dry cough and shortness of breath.  She is immunocompromised on multiple drugs for autoimmune diseases, she felt like a week ago she got a cold and it has turned into a "chest cold" and "bronchitis".  It started with a lot of nasal congestion and copious nasal discharge with associated chills, body aches, night sweats and fatigue.  She started to feel better but she feels like the postnasal drip went into her chest and she now has shortness of breath with walking up or down stairs or doing her job as a Engineering geologist.  She has been exposed to many many patients with illness and particularly cough says she has been doing a lot of chest x-rays.  She has been unable to exercise due to shortness of breath.  She has had no productive sputum.  She has been taking Mucinex but has not been getting better.  She does have some ache in her low posterior ribs and flank area.  She denies any chest pain, denies any weight loss.  No fever.       Patient Active Problem List   Diagnosis Date Noted  . ANA positive 02/10/2016  . Rheumatoid factor positive 02/10/2016  . Primary osteoarthritis, right hand 02/10/2016  . Scoliosis 02/10/2016  . Raynaud's syndrome without gangrene 02/10/2016  . Sjogren's disease ([place]) 05/07/2014  . Vasculitis ([place]) 05/07/2014  . Hashimoto's thyroiditis 05/07/2014  . Acute sinusitis 01/07/2013  . Bronchitis 01/07/2013  . Conjunctivitis 01/07/2013  . Nevus of skin of lip 01/07/2013  . Laryngitis 01/07/2013     Prior to Admission medications   Medication Sig Start Date End Date Taking? Authorizing  Provider  aspirin 81 MG tablet Take 81 mg by mouth daily.   Yes [provider]  azaTHIOprine (IMURAN) 50 MG tablet TAKE 1 & 1/2 TABLETS (75 MG TOTAL) BY MOUTH DAILY. 12/03/17  Yes Deveshwar, Abel Presto, MD  hydroxychloroquine (PLAQUENIL) 200 MG tablet TAKE 1 TABLET (200 MG TOTAL) BY MOUTH DAILY. 12/03/17  Yes Deveshwar, Abel Presto, MD  pilocarpine (SALAGEN) 5 MG tablet TAKE 1 TABLET BY MOUTH 3 TIMES DAILY 07/05/17  Yes Deveshwar, Abel Presto, MD  SYNTHROID 100 MCG tablet TAKE 1 TABLET BY MOUTH DAILY BEFORE BREAKFAST 07/05/17  Yes Princess Bruins, MD     Allergies  Allergen Reactions  . Sulfa Antibiotics      Family History  Problem Relation Age of Onset  . Diabetes Mother   . Diabetes Father   . Scleroderma Daughter      Social History   Socioeconomic History  . Marital status: Married    Spouse name: Not on file  . Number of children: Not on file  . Years of education: Not on file  . Highest education level: Not on file  Occupational History  . Not on file  Social Needs  . Financial resource strain: Not on file  . Food insecurity:    Worry: Not on file    Inability: Not on file  . Transportation needs:    Medical: Not on file  Non-medical: Not on file  Tobacco Use  . Smoking status: Never Smoker  . Smokeless tobacco: Never Used  Substance and Sexual Activity  . Alcohol use: Yes    Comment: social  . Drug use: No  . Sexual activity: Yes    Partners: Male    Comment: 1st itnercourse-18, partners- 51, married- 2 yrs   Lifestyle  . Physical activity:    Days per week: Not on file    Minutes per session: Not on file  . Stress: Not on file  Relationships  . Social connections:    Talks on phone: Not on file    Gets together: Not on file    Attends religious service: Not on file    Active member of club or organization: Not on file    Attends meetings of clubs or organizations: Not on file    Relationship status: Not on file  . Intimate partner violence:     Fear of current or ex partner: Not on file    Emotionally abused: Not on file    Physically abused: Not on file    Forced sexual activity: Not on file  Other Topics Concern  . Not on file  Social History Narrative  . Not on file     Review of Systems  Constitutional: Negative.   HENT: Negative.   Eyes: Negative.   Respiratory: Negative.   Cardiovascular: Negative.   Gastrointestinal: Negative.   Endocrine: Negative.   Genitourinary: Negative.   Musculoskeletal: Negative.   Skin: Negative.   Allergic/Immunologic: Positive for immunocompromised state.  Neurological: Negative.   Hematological: Negative.   Psychiatric/Behavioral: Negative.   All other systems reviewed and are negative.      Objective:    Vitals:   12/27/17 0916  BP: 124/70  Pulse: 73  Temp: 98.3 F (36.8 C)  TempSrc: Oral  SpO2: 98%  Weight: 157 lb (71.2 kg)      Physical Exam Constitutional:      General: She is not in acute distress.    Appearance: Normal appearance. She is well-developed. She is not ill-appearing, toxic-appearing or diaphoretic.  HENT:     Head: Normocephalic and atraumatic.     Right Ear: External ear normal.     Left Ear: External ear normal.     Nose: Rhinorrhea present. No congestion.     Mouth/Throat:     Mouth: Mucous membranes are dry.     Pharynx: Uvula midline. No oropharyngeal exudate or posterior oropharyngeal erythema.  Eyes:     General: Lids are normal.     Conjunctiva/sclera: Conjunctivae normal.     Pupils: Pupils are equal, round, and reactive to light.  Neck:     Musculoskeletal: Normal range of motion and neck supple.     Trachea: Phonation normal. No tracheal deviation.  Cardiovascular:     Rate and Rhythm: Normal rate and regular rhythm.     Pulses: Normal pulses.          Radial pulses are 2+ on the right side and 2+ on the left side.       Posterior tibial pulses are 2+ on the right side and 2+ on the left side.     Heart sounds: Normal heart  sounds. No murmur. No friction rub. No gallop.   Pulmonary:     Effort: Pulmonary effort is normal. No respiratory distress.     Breath sounds: Normal breath sounds. No stridor. No wheezing, rhonchi or rales.  Chest:  Chest wall: No tenderness.  Abdominal:     General: Bowel sounds are normal. There is no distension.     Palpations: Abdomen is soft.     Tenderness: There is no abdominal tenderness. There is no right CVA tenderness, left CVA tenderness, guarding or rebound.  Musculoskeletal: Normal range of motion.        General: No deformity.  Lymphadenopathy:     Cervical: No cervical adenopathy.  Skin:    General: Skin is warm and dry.     Capillary Refill: Capillary refill takes less than 2 seconds.     Coloration: Skin is not pale.     Findings: No rash.  Neurological:     Mental Status: She is alert and oriented to person, place, and time.     Motor: No abnormal muscle tone.     Gait: Gait normal.  Psychiatric:        Mood and Affect: Mood normal.        Speech: Speech normal.        Behavior: Behavior normal.           Assessment & Plan:      ICD-10-CM   1. Acute bronchitis, unspecified organism J20.9   2. Flank pain R10.9 Urinalysis, Routine w reflex microscopic    Patient with 1 week of cold-like symptoms that developed dry cough and shortness of breath.  She is immunocompromise.  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  She has been unable to go to the gym because of her shortness of breath.  She does feel that her sinusitis is gotten better, she states that she chronically has blood in scars in her nose and her nasal discharge and sinus pressure has improved.  She does not feel particularly wheezy and on exam does not have any wheeze rales or rhonchi.  Will treat with Z-Pak, steroids and  inhaler, continue to take Mucinex but push fluids.   Complaint of flank pain seem to be a constant ache in her lower posterior flank area and lower ribs bilaterally her urine was unremarkable, she did stop taking 10 days of Keflex I doubt any urinary infection or kidney stone, suspect it may be muscle skeletal from coughing and or some pleurisy.  Because she is immunocompromise she was encouraged to follow-up closely in 1 week if she is not feeling better she would need to be reevaluated possible chest x-ray or a second round of antibiotics   Delsa Grana, PA-C 12/27/17 9:30 AM

## 2017-12-27 NOTE — Patient Instructions (Signed)
Follow up with [place] (or Urgent Care) if not feeling about 80% better in 5-7 days.

## 2018-01-03 ENCOUNTER — Encounter: Payer: Self-pay | Admitting: Rheumatology

## 2018-01-03 ENCOUNTER — Ambulatory Visit (INDEPENDENT_AMBULATORY_CARE_PROVIDER_SITE_OTHER): Payer: 59 | Admitting: Rheumatology

## 2018-01-03 VITALS — BP 115/73 | HR 86 | Resp 13 | Ht 69.0 in | Wt 156.8 lb

## 2018-01-03 DIAGNOSIS — M19041 Primary osteoarthritis, right hand: Secondary | ICD-10-CM

## 2018-01-03 DIAGNOSIS — M19042 Primary osteoarthritis, left hand: Secondary | ICD-10-CM

## 2018-01-03 DIAGNOSIS — I7300 Raynaud's syndrome without gangrene: Secondary | ICD-10-CM

## 2018-01-03 DIAGNOSIS — I776 Arteritis, unspecified: Secondary | ICD-10-CM

## 2018-01-03 DIAGNOSIS — Z8639 Personal history of other endocrine, nutritional and metabolic disease: Secondary | ICD-10-CM

## 2018-01-03 DIAGNOSIS — M3509 Sicca syndrome with other organ involvement: Secondary | ICD-10-CM | POA: Diagnosis not present

## 2018-01-03 DIAGNOSIS — Z79899 Other long term (current) drug therapy: Secondary | ICD-10-CM

## 2018-01-03 DIAGNOSIS — M419 Scoliosis, unspecified: Secondary | ICD-10-CM | POA: Diagnosis not present

## 2018-01-03 NOTE — Patient Instructions (Signed)
Standing Labs We placed an order today for your standing lab work.    Please come back and get your standing labs in March and every 3 months  We have open lab Monday through Friday from 8:30-11:30 AM and 1:30-4:00 PM  at the office of Dr. Shaili Deveshwar.   You may experience shorter wait times on Monday and Friday afternoons. The office is located at [address], [address], [place], [place] [address] No appointment is necessary.   Labs are drawn by [facility].  You may receive a bill from [facility] for your lab work.  If you wish to have your labs drawn at another location, please call the office 24 hours in advance to send orders.  If you have any questions regarding directions or hours of operation,  please call [phone].   Just as a reminder please drink plenty of water prior to coming for your lab work. Thanks!  

## 2018-01-04 LAB — COMPLETE METABOLIC PANEL WITH GFR
AG RATIO: 1.1 (calc) (ref 1.0–2.5)
ALKALINE PHOSPHATASE (APISO): 78 U/L (ref 33–130)
ALT: 8 U/L (ref 6–29)
AST: 32 U/L (ref 10–35)
Albumin: 4.4 g/dL (ref 3.6–5.1)
BILIRUBIN TOTAL: 0.4 mg/dL (ref 0.2–1.2)
BUN: 19 mg/dL (ref 7–25)
CHLORIDE: 98 mmol/L (ref 98–110)
CO2: 27 mmol/L (ref 20–32)
Calcium: 9.5 mg/dL (ref 8.6–10.4)
Creat: 0.97 mg/dL (ref 0.50–0.99)
GFR, EST AFRICAN AMERICAN: 73 mL/min/{1.73_m2} (ref >=60)
GFR, Est Non African American: 63 mL/min/{1.73_m2} (ref >=60)
Globulin: 3.9 g/dL (calc) — ABNORMAL HIGH (ref 1.9–3.7)
Glucose, Bld: 82 mg/dL (ref 65–99)
POTASSIUM: 4.5 mmol/L (ref 3.5–5.3)
Sodium: 134 mmol/L — ABNORMAL LOW (ref 135–146)
TOTAL PROTEIN: 8.3 g/dL — AB (ref 6.1–8.1)

## 2018-01-04 LAB — CBC WITH DIFFERENTIAL/PLATELET
ABSOLUTE MONOCYTES: 431 {cells}/uL (ref 200–950)
Basophils Absolute: 22 cells/uL (ref 0–200)
Basophils Relative: 0.4 %
EOS ABS: 50 {cells}/uL (ref 15–500)
Eosinophils Relative: 0.9 %
HEMATOCRIT: 41.2 % (ref 35.0–45.0)
HEMOGLOBIN: 13.9 g/dL (ref 11.7–15.5)
LYMPHS ABS: 806 {cells}/uL — AB (ref 850–3900)
MCH: 29 pg (ref 27.0–33.0)
MCHC: 33.7 g/dL (ref 32.0–36.0)
MCV: 85.8 fL (ref 80.0–100.0)
MPV: 9 fL (ref 7.5–12.5)
Monocytes Relative: 7.7 %
Neutro Abs: 4290 cells/uL (ref 1500–7800)
Neutrophils Relative %: 76.6 %
Platelets: 362 10*3/uL (ref 140–400)
RBC: 4.8 10*6/uL (ref 3.80–5.10)
RDW: 13 % (ref 11.0–15.0)
Total Lymphocyte: 14.4 %
WBC: 5.6 10*3/uL (ref 3.8–10.8)

## 2018-01-04 NOTE — Progress Notes (Signed)
Labs are stable.

## 2018-03-05 ENCOUNTER — Other Ambulatory Visit: Payer: Self-pay | Admitting: Rheumatology

## 2018-03-05 MED FILL — HYDROXYCHLOROQUINE SULFATE: 200 | 90 days supply | Qty: 90 | Fill #0 | Status: TO

## 2018-03-05 MED FILL — azaTHIOprine 50 MG TABS: 50 | 90 days supply | Qty: 135 | Fill #0 | Status: TO

## 2018-03-05 NOTE — Telephone Encounter (Addendum)
Last Visit: 01/03/18 Next Visit: 06/05/18 Labs: 01/03/18 stable PLQ Eye Exam: 04/11/17 WNL   Okay to refill per Dr. Estanislado Pandy

## 2018-03-26 ENCOUNTER — Ambulatory Visit (INDEPENDENT_AMBULATORY_CARE_PROVIDER_SITE_OTHER): Payer: 59 | Admitting: Family Medicine

## 2018-03-26 ENCOUNTER — Encounter: Payer: Self-pay | Admitting: Family Medicine

## 2018-03-26 ENCOUNTER — Other Ambulatory Visit: Payer: Self-pay

## 2018-03-26 VITALS — BP 122/72 | HR 88 | Temp 99.0°F | Resp 14 | Ht 69.0 in | Wt 155.0 lb

## 2018-03-26 DIAGNOSIS — R6889 Other general symptoms and signs: Secondary | ICD-10-CM | POA: Diagnosis not present

## 2018-03-26 DIAGNOSIS — J029 Acute pharyngitis, unspecified: Secondary | ICD-10-CM | POA: Diagnosis not present

## 2018-03-26 LAB — INFLUENZA A AND B AG, IMMUNOASSAY
INFLUENZA A ANTIGEN: NOT DETECTED
INFLUENZA B ANTIGEN: NOT DETECTED

## 2018-03-26 NOTE — Patient Instructions (Signed)
Patient given [place] quarantine information  If you develop difficulty breathing, chest pain go to the ER

## 2018-03-26 NOTE — Progress Notes (Signed)
   Subjective:    Patient ID: Jennifer Barr, female    DOB: 28-Dec-1955, 63 y.o.   MRN: [id number]  Patient presents for Illness (x1 day- fever/ chills, cough, body aches, hoarse) Patient presents with f, cough for 3 days with some mild sore throat, however today, has had fever, chills, body aches, Tmax 103.38F 2 hours ago, she took ibuprofen. No wheezing or SOB, has albuterol from previous bronchitis   Her Her husband was seen here yesterday he had some mild aching and headache his flu test and strep was negative.  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 some testing still pending, states often she has already been exposed to patient before they put up the contact isolation signs            Review Of Systems:  GEN- + fatigue, +fever, weight loss,weakness, recent illness HEENT- denies eye drainage, change in vision, nasal discharge, CVS- denies chest pain, palpitations RESP- denies SOB, +cough, wheeze ABD- + N no /V, change in stools, abd pain GU- denies dysuria, hematuria, dribbling, incontinence MSK- denies joint pain, +muscle aches, injury Neuro- denies headache, dizziness, syncope, seizure activity       Objective:    BP 122/72   Pulse 88   Temp 99 F (37.2 C) (Oral)   Resp 14   Ht 5\' 9"  (1.753 m)   Wt 155 lb (70.3 kg)   SpO2 95%   BMI 22.89 kg/m  GEN- NAD, alert and oriented x3 HEENT- PERRL, EOMI, non injected sclera, pink conjunctiva, MMM, oropharynx mild injection, TM clear bilat no effusion,  No  maxillary sinus tenderness, clear   Nasal drainage  Neck- Supple, no LAD CVS- RRR, no murmur RESP-CTAB EXT- No edema Pulses- Radial 2+         Assessment & Plan:      Problem List Items Addressed This Visit    None    Visit Diagnoses    Flu-like symptoms    -  Primary   Flu and strep testing negative. Based on her  occupation and direct contact with PUI, will swab her for Covid-19 and place her on quarentine within her home until results come in Offered cough meds, she will use OTC meds, keep hydrated, rest She does not do well with prednisone She signed appropriate quarantine forms CDC form completed and faxed to local health dept Husband also on quarantine If her testing comes back neg, she return after no fever for at least 24 hours and improved respiratory symptoms as per general guidelines for all viral/flu like illness   Relevant Orders   Influenza A and B Ag, Immunoassay (Completed)   STREP GROUP A AG, W/REFLEX TO CULT (Completed)   Coronavirus CoVID-19 ([facility])   Sore throat       Relevant Orders   STREP GROUP A AG, W/REFLEX TO CULT (Completed)      Note: This dictation was prepared with Dragon dictation along with smaller phrase technology. Any transcriptional errors that result from this process are unintentional.

## 2018-03-28 ENCOUNTER — Telehealth: Payer: Self-pay | Admitting: Family Medicine

## 2018-03-28 LAB — CULTURE, GROUP A STREP
MICRO NUMBER:: 328249
SOURCE:: 0
SPECIMEN QUALITY:: ADEQUATE

## 2018-03-28 LAB — STREP GROUP A AG, W/REFLEX TO CULT: STREPTOCOCCUS, GROUP A SCREEN (DIRECT): NOT DETECTED

## 2018-03-28 NOTE — Telephone Encounter (Signed)
Received fmla paperwork for this patient will route to nurse for completion

## 2018-03-28 NOTE — Telephone Encounter (Signed)
Okay to fill out medical history part/symptoms, address info on back page  this is for continous leave  Leave dates blank and place in my blue folder  Thank You

## 2018-03-28 NOTE — Telephone Encounter (Signed)
Address information and symptoms on form and placed in [facility].

## 2018-03-28 NOTE — Telephone Encounter (Signed)
Form forwarded to Dr. Buelah Manis in blue forms folder on desk. Did not complete form as she is being tested for COVID did not know the expected dates to return to work. Please let me know if you would like for me to hold on to the form until we have the results and I can complete based upon that.

## 2018-03-29 ENCOUNTER — Other Ambulatory Visit: Payer: Self-pay

## 2018-03-31 ENCOUNTER — Telehealth: Payer: Self-pay | Admitting: Family Medicine

## 2018-03-31 NOTE — Telephone Encounter (Signed)
Spoke with pt, her COVID-19 still has not come back She is quarantined and her family members  no further fever, still has laryngitis and cough  Has cough med, declines prescription cough med at this time No red flags, Will complete her FMLA and extend through next week, ongoing cough, will also call [facility], this is day 6 since her testing and no result yet

## 2018-04-01 ENCOUNTER — Telehealth: Payer: Self-pay | Admitting: Family Medicine

## 2018-04-01 MED FILL — PILOCARPINE HCL 5 MG TABLET: 5 | 90 days supply | Qty: 270 | Fill #0

## 2018-04-01 MED FILL — SYNTHROID 100 MCG TABLET: 100 | 90 days supply | Qty: 90 | Fill #0

## 2018-04-01 NOTE — Telephone Encounter (Signed)
FMLA completed. Awaiting testing results to determine dates of absence.

## 2018-04-01 NOTE — Telephone Encounter (Signed)
Received fmla ppw for this patient on 04/01/2018 Will route to christina

## 2018-04-02 LAB — SARS CORONAVIRUS WITH COV-2 RNA, QUAL REAL TIME RT-PCR
Overall Result:: NOT DETECTED
PAN-SARS RNA: NEGATIVE
SARS Cov2 RNA: NEGATIVE

## 2018-04-05 ENCOUNTER — Telehealth: Payer: Self-pay | Admitting: Rheumatology

## 2018-04-05 NOTE — Telephone Encounter (Signed)
Patient has been out of work for two weeks now due to Covid 19. (patient presented with FLU like symptom/ GP ordered Covid test/ it was negative)  Patient is scheduled to return to work Tuesday, but she is not comfortable going back to work. Please call to advise. Patient would like doctors option on her returning to work. She works in the [employer]. Please call to advise.

## 2018-04-05 NOTE — Telephone Encounter (Signed)
Ultimately it is up to the patient if she would like to return to work on Tuesday since she is asymptomatic/negative test results.  She is immunocompromised being on Imuran, so we do advise the patient to wear appropriate PPE and follow the CDC's recommendations.  Please advise patient to call health at work to discuss the situation further.

## 2018-04-05 NOTE — Telephone Encounter (Signed)
Advised patient of information from [place] below. Patient verbalized understanding.

## 2018-04-18 ENCOUNTER — Other Ambulatory Visit: Payer: Self-pay | Admitting: *Deleted

## 2018-04-18 DIAGNOSIS — Z79899 Other long term (current) drug therapy: Secondary | ICD-10-CM

## 2018-04-18 DIAGNOSIS — H52223 Regular astigmatism, bilateral: Secondary | ICD-10-CM | POA: Diagnosis not present

## 2018-04-18 DIAGNOSIS — H5213 Myopia, bilateral: Secondary | ICD-10-CM | POA: Diagnosis not present

## 2018-04-18 DIAGNOSIS — H524 Presbyopia: Secondary | ICD-10-CM | POA: Diagnosis not present

## 2018-04-19 LAB — CBC WITH DIFFERENTIAL/PLATELET
Absolute Monocytes: 327 cells/uL (ref 200–950)
Basophils Absolute: 21 cells/uL (ref 0–200)
Basophils Relative: 0.7 %
Eosinophils Absolute: 30 cells/uL (ref 15–500)
Eosinophils Relative: 1 %
HCT: 38.1 % (ref 35.0–45.0)
Hemoglobin: 12.7 g/dL (ref 11.7–15.5)
Lymphs Abs: 615 cells/uL — ABNORMAL LOW (ref 850–3900)
MCH: 28.5 pg (ref 27.0–33.0)
MCHC: 33.3 g/dL (ref 32.0–36.0)
MCV: 85.6 fL (ref 80.0–100.0)
MPV: 9.9 fL (ref 7.5–12.5)
Monocytes Relative: 10.9 %
Neutro Abs: 2007 cells/uL (ref 1500–7800)
Neutrophils Relative %: 66.9 %
Platelets: 260 10*3/uL (ref 140–400)
RBC: 4.45 10*6/uL (ref 3.80–5.10)
RDW: 13.2 % (ref 11.0–15.0)
Total Lymphocyte: 20.5 %
WBC: 3 10*3/uL — ABNORMAL LOW (ref 3.8–10.8)

## 2018-04-19 LAB — COMPLETE METABOLIC PANEL WITH GFR
AG Ratio: 1.1 (calc) (ref 1.0–2.5)
ALT: 9 U/L (ref 6–29)
AST: 40 U/L — ABNORMAL HIGH (ref 10–35)
Albumin: 4.2 g/dL (ref 3.6–5.1)
Alkaline phosphatase (APISO): 63 U/L (ref 37–153)
BUN: 13 mg/dL (ref 7–25)
CO2: 24 mmol/L (ref 20–32)
Calcium: 9.4 mg/dL (ref 8.6–10.4)
Chloride: 103 mmol/L (ref 98–110)
Creat: 0.86 mg/dL (ref 0.50–0.99)
GFR, Est African American: 84 mL/min/{1.73_m2} (ref >=60)
GFR, Est Non African American: 72 mL/min/{1.73_m2} (ref >=60)
Globulin: 3.7 g/dL (calc) (ref 1.9–3.7)
Glucose, Bld: 82 mg/dL (ref 65–99)
Potassium: 4.3 mmol/L (ref 3.5–5.3)
Sodium: 136 mmol/L (ref 135–146)
Total Bilirubin: 0.6 mg/dL (ref 0.2–1.2)
Total Protein: 7.9 g/dL (ref 6.1–8.1)

## 2018-04-23 ENCOUNTER — Other Ambulatory Visit: Payer: Self-pay

## 2018-04-23 DIAGNOSIS — Z79899 Other long term (current) drug therapy: Secondary | ICD-10-CM

## 2018-04-23 NOTE — Progress Notes (Signed)
Mild elevation in AST noted.  Neutropenia noted.  Please repeat labs in 1 month

## 2018-05-23 NOTE — Progress Notes (Signed)
Office Visit Note  Patient: Jennifer Barr             Date of Birth: [birth year]           MRN: [id number]             PCP: Alycia Rossetti, MD Referring: Alycia Rossetti, MD Visit Date: 06/05/2018 Occupation: @GUAROCC @  Subjective:  Sicca symptoms     History of Present Illness: Jennifer Barr is a 63 y.o. female with history of Sjogren's.  She is taking Imuran 75 mg po daily and PLQ 200 mg 1 tablet by mouth daily.  She takes pilocarpine 5 mg 1 tablet po daily PRN for sicca symptoms.  She states her eye dryness has improved since retiring.  She has been sleeping better.  Her fatigue has improved. She has a chronic nasal ulcer. She denies any vasculitis flares.  She has not had any recent rashes.    Activities of Daily Living:  Patient reports morning stiffness for 1 hour.   Patient Reports nocturnal pain.  Difficulty dressing/grooming: Denies Difficulty climbing stairs: Denies Difficulty getting out of chair: Denies Difficulty using hands for taps, buttons, cutlery, and/or writing: Denies  Review of Systems  Constitutional: Negative for fatigue.  HENT: Positive for mouth dryness and nose dryness (Nose ulcer). Negative for mouth sores.   Eyes: Positive for dryness. Negative for pain and visual disturbance.  Respiratory: Negative for cough, hemoptysis, shortness of breath and difficulty breathing.   Cardiovascular: Negative for chest pain, palpitations, hypertension and swelling in legs/feet.  Gastrointestinal: Negative for blood in stool, constipation and diarrhea.  Endocrine: Negative for increased urination.  Genitourinary: Negative for painful urination.  Musculoskeletal: Positive for morning stiffness. Negative for arthralgias, joint pain, joint swelling, myalgias, muscle weakness, muscle tenderness and myalgias.  Skin: Negative for color change, pallor, rash, hair loss, nodules/bumps, skin tightness, ulcers and sensitivity to sunlight.  Allergic/Immunologic: Negative for  susceptible to infections.  Neurological: Negative for dizziness, numbness, headaches and weakness.  Hematological: Negative for swollen glands.  Psychiatric/Behavioral: Negative for depressed mood and sleep disturbance. The patient is not nervous/anxious.     PMFS History:  Patient Active Problem List   Diagnosis Date Noted  . ANA positive 02/10/2016  . Rheumatoid factor positive 02/10/2016  . Primary osteoarthritis, right hand 02/10/2016  . Scoliosis 02/10/2016  . Raynaud's syndrome without gangrene 02/10/2016  . Sjogren's disease ([place]) 05/07/2014  . Vasculitis ([place]) 05/07/2014  . Hashimoto's thyroiditis 05/07/2014  . Acute sinusitis 01/07/2013  . Bronchitis 01/07/2013  . Conjunctivitis 01/07/2013  . Nevus of skin of lip 01/07/2013  . Laryngitis 01/07/2013    Past Medical History:  Diagnosis Date  . Osteopenia 07/2017   T score -1.3 FRAX 7.2% / 0.6%  . Premature ovarian failure   . Sjoegren syndrome    Dr. Estanislado Pandy  . Thyroid disease    Hashiomto  . Vasculitis ([place])    Dr. Estanislado Pandy    Family History  Problem Relation Age of Onset  . Diabetes Mother   . Diabetes Father   . Scleroderma Daughter    History reviewed. No pertinent surgical history. Social History   Social History Narrative  . Not on file    There is no immunization history on file for this patient.   Objective: Vital Signs: BP 113/66 (BP Location: Left Arm, Patient Position: Sitting, Cuff Size: Normal)   Pulse 67   Resp 13   Ht 5\' 9"  (1.753 m)   Wt 155 lb (  70.3 kg)   BMI 22.89 kg/m    Physical Exam Vitals signs and nursing note reviewed.  Constitutional:      Appearance: She is well-developed.  HENT:     Head: Normocephalic and atraumatic.  Eyes:     Conjunctiva/sclera: Conjunctivae normal.  Neck:     Musculoskeletal: Normal range of motion.  Cardiovascular:     Rate and Rhythm: Normal rate and regular rhythm.     Heart sounds: Normal heart sounds.  Pulmonary:     Effort:  Pulmonary effort is normal.     Breath sounds: Normal breath sounds.  Abdominal:     General: Bowel sounds are normal.     Palpations: Abdomen is soft.  Lymphadenopathy:     Cervical: No cervical adenopathy.  Skin:    General: Skin is warm and dry.     Capillary Refill: Capillary refill takes less than 2 seconds.  Neurological:     Mental Status: She is alert and oriented to person, place, and time.  Psychiatric:        Behavior: Behavior normal.      Musculoskeletal Exam: C-spine, thoracic spine, and lumbar spine good ROM. Shoulder joints, elbow joints, wrist joints, MCPs, PIPs, and DIPs good ROM with no synovitis.  Hip joints, knee joints, ankle joints, MTPs, PIPs, and DIPs good ROM with no synovitis.  No warmth or effusion of knee joints.  No tenderness or swelling of ankle joints.    CDAI Exam: CDAI Score: Not documented Patient Global Assessment: Not documented; Provider Global Assessment: Not documented Swollen: Not documented; Tender: Not documented Joint Exam   Not documented   There is currently no information documented on the homunculus. Go to the Rheumatology activity and complete the homunculus joint exam.  Investigation: No additional findings.  Imaging: No results found.  Recent Labs: Lab Results  Component Value Date   WBC 3.9 05/28/2018   HGB 12.9 05/28/2018   PLT 291 05/28/2018   NA 136 05/28/2018   K 4.3 05/28/2018   CL 105 05/28/2018   CO2 21 05/28/2018   GLUCOSE 89 05/28/2018   BUN 17 05/28/2018   CREATININE 0.95 05/28/2018   BILITOT 0.6 05/28/2018   ALKPHOS 79 08/03/2016   AST 37 (H) 05/28/2018   ALT 8 05/28/2018   PROT 7.7 05/28/2018   ALBUMIN 4.2 08/03/2016   CALCIUM 9.3 05/28/2018   GFRAA 74 05/28/2018    Speciality Comments: PLQ Eye Exam: 04/18/18 WNL @ Marica Otter, OD PA  Procedures:  No procedures performed Allergies: Sulfa antibiotics       Assessment / Plan:     Visit Diagnoses: Sjogren's syndrome with other organ  involvement ([place]) -  Positive ANA, positive Ro, positive La, positive RF: She has chronic sicca symptoms.  She reports that her eye dryness has improved since retiring.  She states he has been sleeping better and has had less exposure to blue light, which has helped her symptoms.  She takes Pilocarpine 5 mg 1 to 2 tablets by mouth daily for symptomatic relief.  She will continue on PLQ 200 mg 1 tablet po daily and will start alternating Imuran 50 mg and 75 mg every other day.  Future orders for RF, SPEP, and UA were placed today.  Standing orders for CBC and CMP were placed. She will follow up in 6 months.   High risk medication use - She will start alternating Imuran 50 mg and 75 mg every other day.  She will continue taking PLQ 200  mg by mouth daily. PLQ eye exam: 04/18/18. Absolute lymphocytes were 484 on 05/28/18, which is why she will be alternating Imuran 50 mg and 75 mg every other day. She will return for lab work in 3 months to monitor for drug toxicity.   Vasculitis ([place]): She has not had any recent flares or recurrences.  She will start alternating Imuran 50 mg and 75 mg every other day.  She was advised to notify us if she develops any signs or symptoms of a flare.   Raynaud's syndrome without gangrene: She has intermittent symptoms of Raynaud's.  She has no digital ulcerations or signs of gangrene.  She was encouraged to keep her core body temperature warm.   Primary osteoarthritis of both hands: She has no tenderness or synovitis on exam.  She has complete fist formation.  Joint protection and muscle strengthening were discussed.   Scoliosis: She has no back pain at this time.   History of hypothyroidism   Orders: No orders of the defined types were placed in this encounter.  No orders of the defined types were placed in this encounter.   Follow-Up Instructions: Return in about 6 months (around 12/06/2018) for Sjogren's syndrome.   Ofilia Neas, PA-C   I examined and evaluated  the patient with Hazel Sams PA.  Been doing clinically well since she has been retired.  Her sicca symptoms have improved.  She has not noticed any recent rash from vasculitis.  We discussed decreasing Imuran to 75 mg alternating with 50 mg every other day.  We will continue to monitor her labs.  If she has any flare she will notify us.  The plan of care was discussed as noted above.  Bo Merino, MD  Note - This record has been created using Editor, commissioning.  Chart creation errors have been sought, but may not always  have been located. Such creation errors do not reflect on  the standard of medical care.

## 2018-05-28 ENCOUNTER — Other Ambulatory Visit: Payer: Self-pay

## 2018-05-28 ENCOUNTER — Telehealth: Payer: Self-pay | Admitting: Rheumatology

## 2018-05-28 DIAGNOSIS — Z79899 Other long term (current) drug therapy: Secondary | ICD-10-CM

## 2018-05-28 NOTE — Telephone Encounter (Signed)
Opened in error

## 2018-05-29 LAB — COMPLETE METABOLIC PANEL WITH GFR
AG Ratio: 1.3 (calc) (ref 1.0–2.5)
ALT: 8 U/L (ref 6–29)
AST: 37 U/L — ABNORMAL HIGH (ref 10–35)
Albumin: 4.4 g/dL (ref 3.6–5.1)
Alkaline phosphatase (APISO): 79 U/L (ref 37–153)
BUN: 17 mg/dL (ref 7–25)
CO2: 21 mmol/L (ref 20–32)
Calcium: 9.3 mg/dL (ref 8.6–10.4)
Chloride: 105 mmol/L (ref 98–110)
Creat: 0.95 mg/dL (ref 0.50–0.99)
GFR, Est African American: 74 mL/min/{1.73_m2} (ref >=60)
GFR, Est Non African American: 64 mL/min/{1.73_m2} (ref >=60)
Globulin: 3.3 g/dL (calc) (ref 1.9–3.7)
Glucose, Bld: 89 mg/dL (ref 65–99)
Potassium: 4.3 mmol/L (ref 3.5–5.3)
Sodium: 136 mmol/L (ref 135–146)
Total Bilirubin: 0.6 mg/dL (ref 0.2–1.2)
Total Protein: 7.7 g/dL (ref 6.1–8.1)

## 2018-05-29 LAB — CBC WITH DIFFERENTIAL/PLATELET
Absolute Monocytes: 425 cells/uL (ref 200–950)
Basophils Absolute: 20 cells/uL (ref 0–200)
Basophils Relative: 0.5 %
Eosinophils Absolute: 62 cells/uL (ref 15–500)
Eosinophils Relative: 1.6 %
HCT: 38.8 % (ref 35.0–45.0)
Hemoglobin: 12.9 g/dL (ref 11.7–15.5)
Lymphs Abs: 484 cells/uL — ABNORMAL LOW (ref 850–3900)
MCH: 28.4 pg (ref 27.0–33.0)
MCHC: 33.2 g/dL (ref 32.0–36.0)
MCV: 85.5 fL (ref 80.0–100.0)
MPV: 9.8 fL (ref 7.5–12.5)
Monocytes Relative: 10.9 %
Neutro Abs: 2909 cells/uL (ref 1500–7800)
Neutrophils Relative %: 74.6 %
Platelets: 291 10*3/uL (ref 140–400)
RBC: 4.54 10*6/uL (ref 3.80–5.10)
RDW: 13.1 % (ref 11.0–15.0)
Total Lymphocyte: 12.4 %
WBC: 3.9 10*3/uL (ref 3.8–10.8)

## 2018-05-29 NOTE — Progress Notes (Signed)
stable °

## 2018-05-30 ENCOUNTER — Other Ambulatory Visit: Payer: Self-pay | Admitting: Rheumatology

## 2018-05-30 MED FILL — azaTHIOprine 50 MG TABS: 50 | 34 days supply | Qty: 51 | Fill #0

## 2018-05-30 NOTE — Telephone Encounter (Signed)
Last Visit: 01/03/18 Next Visit: 06/05/18 Labs: 05/28/18 stable PLQ Eye Exam: 04/11/17 WNL   Patient advised she needs to update PLQ eye exam. Patient states she had it update in April and will have results faxed to our office.   Okay to refill per Dr. Estanislado Pandy

## 2018-06-05 ENCOUNTER — Ambulatory Visit (INDEPENDENT_AMBULATORY_CARE_PROVIDER_SITE_OTHER): Payer: PRIVATE HEALTH INSURANCE | Admitting: Rheumatology

## 2018-06-05 ENCOUNTER — Encounter: Payer: Self-pay | Admitting: Physician Assistant

## 2018-06-05 ENCOUNTER — Telehealth: Payer: Self-pay | Admitting: Pharmacist

## 2018-06-05 ENCOUNTER — Other Ambulatory Visit: Payer: Self-pay

## 2018-06-05 VITALS — BP 113/66 | HR 67 | Resp 13 | Ht 69.0 in | Wt 155.0 lb

## 2018-06-05 DIAGNOSIS — I776 Arteritis, unspecified: Secondary | ICD-10-CM | POA: Diagnosis not present

## 2018-06-05 DIAGNOSIS — M19042 Primary osteoarthritis, left hand: Secondary | ICD-10-CM

## 2018-06-05 DIAGNOSIS — M3509 Sicca syndrome with other organ involvement: Secondary | ICD-10-CM | POA: Diagnosis not present

## 2018-06-05 DIAGNOSIS — I7300 Raynaud's syndrome without gangrene: Secondary | ICD-10-CM | POA: Diagnosis not present

## 2018-06-05 DIAGNOSIS — M419 Scoliosis, unspecified: Secondary | ICD-10-CM

## 2018-06-05 DIAGNOSIS — Z8639 Personal history of other endocrine, nutritional and metabolic disease: Secondary | ICD-10-CM

## 2018-06-05 DIAGNOSIS — M19041 Primary osteoarthritis, right hand: Secondary | ICD-10-CM

## 2018-06-05 DIAGNOSIS — Z79899 Other long term (current) drug therapy: Secondary | ICD-10-CM

## 2018-06-05 MED FILL — HYDROXYCHLOROQUINE SULFATE: 200 | 34 days supply | Qty: 34 | Fill #0

## 2018-06-05 NOTE — Telephone Encounter (Signed)
Received a fax from [facility] regarding a prior authorization for Plaquenil. Authorization has been APPROVED from 06/05/2018 to 06/05/2019.   Will send document to scan center.  Authorization #  [employer] [id number]

## 2018-06-05 NOTE — Telephone Encounter (Signed)
Received a Prior Authorization request from [facility] for Plaquenil. Authorization has been submitted to patient's insurance via Cover My Meds. Will update once we receive a response.

## 2018-06-05 NOTE — Patient Instructions (Signed)
Standing Labs We placed an order today for your standing lab work.    Please come back and get your standing labs in August and every 3 months  We have open lab daily Monday through Thursday from 8:30-12:30 PM and 1:30-4:30 PM and Friday from 8:30-12:30 PM and 1:30 -4:00 PM at the office of Dr. Shaili Deveshwar.   You may experience shorter wait times on Monday and Friday afternoons. The office is located at [address], [address], [place], [place] [address] No appointment is necessary.   Labs are drawn by [facility].  You may receive a bill from [facility] for your lab work.  If you wish to have your labs drawn at another location, please call the office 24 hours in advance to send orders.  If you have any questions regarding directions or hours of operation,  please call [phone].   Just as a reminder please drink plenty of water prior to coming for your lab work. Thanks!   

## 2018-06-06 ENCOUNTER — Encounter: Payer: Self-pay | Admitting: Obstetrics & Gynecology

## 2018-06-06 ENCOUNTER — Ambulatory Visit (INDEPENDENT_AMBULATORY_CARE_PROVIDER_SITE_OTHER): Payer: PRIVATE HEALTH INSURANCE | Admitting: Obstetrics & Gynecology

## 2018-06-06 VITALS — BP 120/70 | Ht 68.0 in | Wt 153.0 lb

## 2018-06-06 DIAGNOSIS — M3509 Sicca syndrome with other organ involvement: Secondary | ICD-10-CM

## 2018-06-06 DIAGNOSIS — Z780 Asymptomatic menopausal state: Secondary | ICD-10-CM | POA: Diagnosis not present

## 2018-06-06 DIAGNOSIS — Z01419 Encounter for gynecological examination (general) (routine) without abnormal findings: Secondary | ICD-10-CM

## 2018-06-06 DIAGNOSIS — Z23 Encounter for immunization: Secondary | ICD-10-CM

## 2018-06-06 DIAGNOSIS — E063 Autoimmune thyroiditis: Secondary | ICD-10-CM | POA: Diagnosis not present

## 2018-06-06 NOTE — Progress Notes (Signed)
Jennifer Barr 1955-05-14 [id number]   History:    62 y.o. [id number] Married.  Just retired.  Will babysit her grand-child.  RP:  Established patient presenting for annual gyn exam   HPI: Menopause, well on no HRT.  No PMB.  No pelvic pain.  Dryness with IC, will use coconut oil.  Urine/BMs normal.  Breasts normal.  BMI 23.26.  Very fit.  Healthy nutrition.  Colonoscopy 2019.  Hypothyroidism on Synthroid.  Sjogren's Disease.  Past medical history,surgical history, family history and social history were all reviewed and documented in the EPIC chart.  Gynecologic History No LMP recorded. Patient is postmenopausal. Contraception: post menopausal status Last Pap: 05/2017. Results were: Negative Last mammogram: 09/2017. Results were: Negative Bone Density: 07/2017 Osteopenia Colonoscopy: 2019  Obstetric History OB History  Gravida Para Term Preterm AB Living  4 3   1   3   SAB TAB Ectopic Multiple Live Births               # Outcome Date GA Lbr Len/2nd Weight Sex Delivery Anes PTL Lv  4 Gravida           3 Para           2 Para           1 Preterm              ROS: A ROS was performed and pertinent positives and negatives are included in the history.  GENERAL: No fevers or chills. HEENT: No change in vision, no earache, sore throat or sinus congestion. NECK: No pain or stiffness. CARDIOVASCULAR: No chest pain or pressure. No palpitations. PULMONARY: No shortness of breath, cough or wheeze. GASTROINTESTINAL: No abdominal pain, nausea, vomiting or diarrhea, melena or bright red blood per rectum. GENITOURINARY: No urinary frequency, urgency, hesitancy or dysuria. MUSCULOSKELETAL: No joint or muscle pain, no back pain, no recent trauma. DERMATOLOGIC: No rash, no itching, no lesions. ENDOCRINE: No polyuria, polydipsia, no heat or cold intolerance. No recent change in weight. HEMATOLOGICAL: No anemia or easy bruising or bleeding. NEUROLOGIC: No headache, seizures, numbness, tingling or weakness.  PSYCHIATRIC: No depression, no loss of interest in normal activity or change in sleep pattern.     Exam:   BP 120/70   Ht 5\' 8"  (1.727 m)   Wt 153 lb (69.4 kg)   BMI 23.26 kg/m   Body mass index is 23.26 kg/m.  General appearance : Well developed well nourished female. No acute distress HEENT: Eyes: no retinal hemorrhage or exudates,  Neck supple, trachea midline, no carotid bruits, no thyroidmegaly Lungs: Clear to auscultation, no rhonchi or wheezes, or rib retractions  Heart: Regular rate and rhythm, no murmurs or gallops Breast:Examined in sitting and supine position were symmetrical in appearance, no palpable masses or tenderness,  no skin retraction, no nipple inversion, no nipple discharge, no skin discoloration, no axillary or supraclavicular lymphadenopathy Abdomen: no palpable masses or tenderness, no rebound or guarding Extremities: no edema or skin discoloration or tenderness  Pelvic: Vulva: Normal             Vagina: No gross lesions or discharge  Cervix: No gross lesions or discharge  Uterus  AV, normal size, shape and consistency, non-tender and mobile  Adnexa  Without masses or tenderness  Anus: Normal   Assessment/Plan:  63 y.o. female for annual exam   1. Well female exam with routine gynecological exam Normal gynecologic exam in menopause.  Pap test negative 05/2017.  Breasts normal.  Screening Mammo 09/2017 negative.  Colonoscopy 2019.  Fasting Health labs here today.  Good BMI 23.26.  Very good fitness.  Healthy nutrition. - Lipid panel - TSH - VITAMIN D 25 Hydroxy (Vit-D Deficiency, Fractures)  2. Postmenopause Well on no HRT.  No PMB.  Vit D supplements, Ca++ intake 1200-1500 mg daily, weight bearing physical activities to continue.  BD Osteopenia 07/2017. - VITAMIN D 25 Hydroxy (Vit-D Deficiency, Fractures)  3. Hashimoto's thyroiditis Synthroid represcribed. - TSH  4. Sjogren's syndrome with other organ involvement ([place]) Stable on medication.  5.  Immunization, tetanus-diphtheria Overdue for Tetanus immunization.  Tdap given today.  Counseling on above issues and coordination of care >50% x 10 minutes.  Princess Bruins MD, 3:06 PM 06/06/2018

## 2018-06-06 NOTE — Patient Instructions (Addendum)
1. Well female exam with routine gynecological exam Normal gynecologic exam in menopause.  Pap test negative 05/2017.  Breasts normal.  Screening Mammo 09/2017 negative.  Colonoscopy 2019.  Fasting Health labs here today.  Good BMI 23.26.  Very good fitness.  Healthy nutrition. - Lipid panel - TSH - VITAMIN D 25 Hydroxy (Vit-D Deficiency, Fractures)  2. Postmenopause Well on no HRT.  No PMB.  Vit D supplements, Ca++ intake 1200-1500 mg daily, weight bearing physical activities to continue.  BD Osteopenia 07/2017. - VITAMIN D 25 Hydroxy (Vit-D Deficiency, Fractures)  3. Hashimoto's thyroiditis Synthroid represcribed. - TSH  4. Sjogren's syndrome with other organ involvement ([place]) Stable on medication.  5. Immunization, tetanus-diphtheria Overdue for Tetanus immunization.  Tdap given today.  [place], it was a pleasure seeing you today!  I will inform you of your results as soon as they are available.

## 2018-06-08 LAB — LIPID PANEL
Cholesterol: 182 mg/dL (ref <200)
HDL: 54 mg/dL (ref >=50)
LDL Cholesterol (Calc): 111 mg/dL (calc) — ABNORMAL HIGH
Non-HDL Cholesterol (Calc): 128 mg/dL (calc) (ref <130)
Total CHOL/HDL Ratio: 3.4 (calc) (ref <5.0)
Triglycerides: 77 mg/dL (ref <150)

## 2018-06-08 LAB — COMPREHENSIVE METABOLIC PANEL
AG Ratio: 1.2 (calc) (ref 1.0–2.5)
ALT: 8 U/L (ref 6–29)
AST: 36 U/L — ABNORMAL HIGH (ref 10–35)
Albumin: 4.3 g/dL (ref 3.6–5.1)
Alkaline phosphatase (APISO): 69 U/L (ref 37–153)
BUN: 12 mg/dL (ref 7–25)
CO2: 19 mmol/L — ABNORMAL LOW (ref 20–32)
Calcium: 9.4 mg/dL (ref 8.6–10.4)
Chloride: 106 mmol/L (ref 98–110)
Creat: 0.93 mg/dL (ref 0.50–0.99)
Globulin: 3.5 g/dL (calc) (ref 1.9–3.7)
Glucose, Bld: 79 mg/dL (ref 65–99)
Potassium: 4 mmol/L (ref 3.5–5.3)
Sodium: 137 mmol/L (ref 135–146)
Total Bilirubin: 0.7 mg/dL (ref 0.2–1.2)
Total Protein: 7.8 g/dL (ref 6.1–8.1)

## 2018-06-08 LAB — VITAMIN D 25 HYDROXY (VIT D DEFICIENCY, FRACTURES): Vit D, 25-Hydroxy: 43 ng/mL (ref 30–100)

## 2018-06-08 LAB — TSH: TSH: 0.31 mIU/L — ABNORMAL LOW (ref 0.40–4.50)

## 2018-06-08 LAB — CBC

## 2018-06-11 ENCOUNTER — Other Ambulatory Visit: Payer: Self-pay | Admitting: Obstetrics & Gynecology

## 2018-06-11 DIAGNOSIS — R7989 Other specified abnormal findings of blood chemistry: Secondary | ICD-10-CM

## 2018-06-11 MED ORDER — LEVOTHYROXINE SODIUM 75 MCG PO TABS: 75.0000 ug | ORAL_TABLET | Freq: Every day | ORAL | 0 refills | Status: DC

## 2018-06-11 MED FILL — SYNTHROID 75 MCG TABLET: 75 | 34 days supply | Qty: 34 | Fill #0

## 2018-06-11 NOTE — Progress Notes (Signed)
Order(s) created erroneously. Erroneous order ID: [id number]  Order canceled by: Pete Pelt  Order cancel date/time: 06/11/2018 2:57 PM

## 2018-07-16 MED FILL — azaTHIOprine 50 MG TABS: 50 | 34 days supply | Qty: 51 | Fill #0

## 2018-07-16 MED FILL — SYNTHROID 75 MCG TABLET: 75 | 34 days supply | Qty: 34 | Fill #1

## 2018-07-16 MED FILL — HYDROXYCHLOROQUINE SULFATE: 200 | 34 days supply | Qty: 34 | Fill #1

## 2018-08-16 MED FILL — HYDROXYCHLOROQUINE SULFATE: 200 | 22 days supply | Qty: 22 | Fill #2

## 2018-08-16 MED FILL — SYNTHROID 75 MCG TABLET: 75 | 22 days supply | Qty: 22 | Fill #2

## 2018-08-16 MED FILL — azaTHIOprine 50 MG TABS: 50 | 22 days supply | Qty: 33 | Fill #1

## 2018-08-21 ENCOUNTER — Telehealth: Payer: Self-pay | Admitting: Rheumatology

## 2018-08-21 ENCOUNTER — Other Ambulatory Visit: Payer: Self-pay | Admitting: *Deleted

## 2018-08-21 DIAGNOSIS — M3509 Sicca syndrome with other organ involvement: Secondary | ICD-10-CM

## 2018-08-21 DIAGNOSIS — Z79899 Other long term (current) drug therapy: Secondary | ICD-10-CM

## 2018-08-21 NOTE — Telephone Encounter (Signed)
Patient going to [facility] on [address] now for labs. Please release orders.

## 2018-08-21 NOTE — Telephone Encounter (Signed)
Lab orders released.  

## 2018-08-22 NOTE — Progress Notes (Signed)
AST is elevated-46.  Please advise patient to avoid NSAIDs, tylenol, and alcohol. CBC stable.  UA WNL.  RF Negative.

## 2018-08-23 LAB — CBC WITH DIFFERENTIAL/PLATELET
Absolute Monocytes: 344 cells/uL (ref 200–950)
Basophils Absolute: 30 cells/uL (ref 0–200)
Basophils Relative: 0.7 %
Eosinophils Absolute: 30 cells/uL (ref 15–500)
Eosinophils Relative: 0.7 %
HCT: 41.4 % (ref 35.0–45.0)
Hemoglobin: 13.5 g/dL (ref 11.7–15.5)
Lymphs Abs: 679 cells/uL — ABNORMAL LOW (ref 850–3900)
MCH: 28.5 pg (ref 27.0–33.0)
MCHC: 32.6 g/dL (ref 32.0–36.0)
MCV: 87.3 fL (ref 80.0–100.0)
MPV: 10.1 fL (ref 7.5–12.5)
Monocytes Relative: 8 %
Neutro Abs: 3216 cells/uL (ref 1500–7800)
Neutrophils Relative %: 74.8 %
Platelets: 287 10*3/uL (ref 140–400)
RBC: 4.74 10*6/uL (ref 3.80–5.10)
RDW: 13.3 % (ref 11.0–15.0)
Total Lymphocyte: 15.8 %
WBC: 4.3 10*3/uL (ref 3.8–10.8)

## 2018-08-23 LAB — PROTEIN ELECTROPHORESIS, SERUM, WITH REFLEX
Albumin ELP: 4.3 g/dL (ref 3.8–4.8)
Alpha 1: 0.3 g/dL (ref 0.2–0.3)
Alpha 2: 0.8 g/dL (ref 0.5–0.9)
Beta 2: 0.4 g/dL (ref 0.2–0.5)
Beta Globulin: 0.5 g/dL (ref 0.4–0.6)
Gamma Globulin: 1.7 g/dL (ref 0.8–1.7)
Total Protein: 7.9 g/dL (ref 6.1–8.1)

## 2018-08-23 LAB — URINALYSIS, ROUTINE W REFLEX MICROSCOPIC
Bilirubin Urine: NEGATIVE
Glucose, UA: NEGATIVE
Hgb urine dipstick: NEGATIVE
Ketones, ur: NEGATIVE
Leukocytes,Ua: NEGATIVE
Nitrite: NEGATIVE
Protein, ur: NEGATIVE
Specific Gravity, Urine: 1.008 (ref 1.001–1.03)
pH: 6 (ref 5.0–8.0)

## 2018-08-23 LAB — COMPLETE METABOLIC PANEL WITH GFR
AG Ratio: 1.2 (calc) (ref 1.0–2.5)
ALT: 10 U/L (ref 6–29)
AST: 46 U/L — ABNORMAL HIGH (ref 10–35)
Albumin: 4.4 g/dL (ref 3.6–5.1)
Alkaline phosphatase (APISO): 75 U/L (ref 37–153)
BUN: 18 mg/dL (ref 7–25)
CO2: 26 mmol/L (ref 20–32)
Calcium: 9.6 mg/dL (ref 8.6–10.4)
Chloride: 102 mmol/L (ref 98–110)
Creat: 0.97 mg/dL (ref 0.50–0.99)
GFR, Est African American: 73 mL/min/{1.73_m2} (ref >=60)
GFR, Est Non African American: 63 mL/min/{1.73_m2} (ref >=60)
Globulin: 3.7 g/dL (calc) (ref 1.9–3.7)
Glucose, Bld: 82 mg/dL (ref 65–99)
Potassium: 4.1 mmol/L (ref 3.5–5.3)
Sodium: 136 mmol/L (ref 135–146)
Total Bilirubin: 0.6 mg/dL (ref 0.2–1.2)
Total Protein: 8.1 g/dL (ref 6.1–8.1)

## 2018-08-23 LAB — RHEUMATOID FACTOR: Rhuematoid fact SerPl-aCnc: <14 IU/mL (ref <14)

## 2018-09-09 ENCOUNTER — Other Ambulatory Visit: Payer: PRIVATE HEALTH INSURANCE

## 2018-09-09 ENCOUNTER — Other Ambulatory Visit: Payer: Self-pay

## 2018-09-09 DIAGNOSIS — R7989 Other specified abnormal findings of blood chemistry: Secondary | ICD-10-CM

## 2018-09-09 LAB — TSH: TSH: 15.55 mIU/L — ABNORMAL HIGH (ref 0.40–4.50)

## 2018-09-10 ENCOUNTER — Telehealth: Payer: Self-pay

## 2018-09-10 ENCOUNTER — Other Ambulatory Visit: Payer: Self-pay | Admitting: Rheumatology

## 2018-09-10 DIAGNOSIS — R7989 Other specified abnormal findings of blood chemistry: Secondary | ICD-10-CM

## 2018-09-10 DIAGNOSIS — M3509 Sicca syndrome with other organ involvement: Secondary | ICD-10-CM

## 2018-09-10 MED ORDER — LEVOTHYROXINE SODIUM 100 MCG PO TABS: 100.0000 ug | ORAL_TABLET | Freq: Every day | ORAL | 0 refills | Status: DC

## 2018-09-10 MED FILL — LEVOTHYROXINE 100 MCG TABLE: 100 | 30 days supply | Qty: 30 | Fill #0

## 2018-09-10 MED FILL — HYDROXYCHLOROQUINE SULFATE: 200 | 30 days supply | Qty: 30 | Fill #0

## 2018-09-10 NOTE — Telephone Encounter (Signed)
Reincrease Synthroid to 100 mcg daily and repeat TSH in 4 weeks.

## 2018-09-10 NOTE — Telephone Encounter (Signed)
Spoke with patient and informed her. Rx sent. Lab order placed on file and she will call for lab appointment.

## 2018-09-10 NOTE — Telephone Encounter (Signed)
Patient said 3 mos ago you changed her dose of Synthroid from 100 mcg to 75 mcg.  She asked if anyway you would be willing to adjust it one more time before sending her to endocrinologist.

## 2018-09-10 NOTE — Telephone Encounter (Signed)
Last Visit: 06/05/18 Next Visit: 12/04/18 Labs: 08/21/18 AST is elevated-46. CBC stable Eye exam: 04/18/18 WNL  Okay to refill per Dr. Estanislado Pandy

## 2018-09-10 NOTE — Telephone Encounter (Signed)
-----   Message from Princess Bruins, MD sent at 09/09/2018  5:18 PM EDT ----- Refer to Endocrinologist.  Hypothyroidism.

## 2018-10-07 ENCOUNTER — Other Ambulatory Visit: Payer: Self-pay | Admitting: Rheumatology

## 2018-10-07 MED FILL — azaTHIOprine 50 MG TABS: 50 | 30 days supply | Qty: 45 | Fill #0

## 2018-10-07 MED FILL — HYDROXYCHLOROQUINE SULFATE: 200 | 30 days supply | Qty: 30 | Fill #1

## 2018-10-07 NOTE — Telephone Encounter (Signed)
Please schedule patient a follow up visit. Patient due November 2020. Thanks!

## 2018-10-07 NOTE — Telephone Encounter (Signed)
Last Visit: 06/05/18 Next Visit: due November 2020. Message sent to the front to schedule patient. Labs: 08/21/18 AST is elevated-46  Okay to refill per Dr. Estanislado Pandy

## 2018-10-08 ENCOUNTER — Encounter: Payer: Self-pay | Admitting: Gynecology

## 2018-10-14 ENCOUNTER — Other Ambulatory Visit: Payer: Self-pay

## 2018-10-14 ENCOUNTER — Ambulatory Visit (INDEPENDENT_AMBULATORY_CARE_PROVIDER_SITE_OTHER): Payer: PRIVATE HEALTH INSURANCE | Admitting: Anesthesiology

## 2018-10-14 ENCOUNTER — Other Ambulatory Visit: Payer: PRIVATE HEALTH INSURANCE

## 2018-10-14 DIAGNOSIS — R7989 Other specified abnormal findings of blood chemistry: Secondary | ICD-10-CM

## 2018-10-14 DIAGNOSIS — Z23 Encounter for immunization: Secondary | ICD-10-CM

## 2018-10-14 LAB — TSH: TSH: 1.36 mIU/L (ref 0.40–4.50)

## 2018-10-17 ENCOUNTER — Other Ambulatory Visit: Payer: Self-pay

## 2018-10-17 DIAGNOSIS — R7989 Other specified abnormal findings of blood chemistry: Secondary | ICD-10-CM

## 2018-10-17 MED ORDER — LEVOTHYROXINE SODIUM 100 MCG PO TABS: 100.0000 ug | ORAL_TABLET | Freq: Every day | ORAL | 12 refills | Status: DC

## 2018-10-17 MED FILL — SYNTHROID 100 MCG TABLET: 100 | 30 days supply | Qty: 30 | Fill #0

## 2018-10-17 NOTE — Telephone Encounter (Signed)
Patient recently had TSH level checked and was normal. Needs refill on Synthroid 175mcg as she is out.   Princess Bruins, MD    09/10/18 9:42 AM    Reincrease Synthroid to 100 mcg daily and repeat TSH in 4 weeks.     TSH result on 10/14/18 was 1.36.

## 2018-10-17 NOTE — Telephone Encounter (Signed)
Spoke with patient and let her know Rx sent.

## 2018-11-05 MED FILL — HYDROXYCHLOROQUINE SULFATE: 200 | 30 days supply | Qty: 30 | Fill #2

## 2018-11-05 MED FILL — azaTHIOprine 50 MG TABS: 50 | 30 days supply | Qty: 45 | Fill #1

## 2018-11-19 ENCOUNTER — Other Ambulatory Visit: Payer: Self-pay

## 2018-11-19 DIAGNOSIS — Z79899 Other long term (current) drug therapy: Secondary | ICD-10-CM

## 2018-11-20 LAB — CBC WITH DIFFERENTIAL/PLATELET
Absolute Monocytes: 428 cells/uL (ref 200–950)
Basophils Absolute: 34 cells/uL (ref 0–200)
Basophils Relative: 0.4 %
Eosinophils Absolute: 34 cells/uL (ref 15–500)
Eosinophils Relative: 0.4 %
HCT: 37.5 % (ref 35.0–45.0)
Hemoglobin: 12.3 g/dL (ref 11.7–15.5)
Lymphs Abs: 605 cells/uL — ABNORMAL LOW (ref 850–3900)
MCH: 28.4 pg (ref 27.0–33.0)
MCHC: 32.8 g/dL (ref 32.0–36.0)
MCV: 86.6 fL (ref 80.0–100.0)
MPV: 9.6 fL (ref 7.5–12.5)
Monocytes Relative: 5.1 %
Neutro Abs: 7300 cells/uL (ref 1500–7800)
Neutrophils Relative %: 86.9 %
Platelets: 260 10*3/uL (ref 140–400)
RBC: 4.33 10*6/uL (ref 3.80–5.10)
RDW: 12.5 % (ref 11.0–15.0)
Total Lymphocyte: 7.2 %
WBC: 8.4 10*3/uL (ref 3.8–10.8)

## 2018-11-20 LAB — COMPLETE METABOLIC PANEL WITH GFR
AG Ratio: 1.2 (calc) (ref 1.0–2.5)
ALT: 11 U/L (ref 6–29)
AST: 53 U/L — ABNORMAL HIGH (ref 10–35)
Albumin: 4.1 g/dL (ref 3.6–5.1)
Alkaline phosphatase (APISO): 60 U/L (ref 37–153)
BUN/Creatinine Ratio: 16 (calc) (ref 6–22)
BUN: 17 mg/dL (ref 7–25)
CO2: 22 mmol/L (ref 20–32)
Calcium: 9.2 mg/dL (ref 8.6–10.4)
Chloride: 105 mmol/L (ref 98–110)
Creat: 1.04 mg/dL — ABNORMAL HIGH (ref 0.50–0.99)
GFR, Est African American: 67 mL/min/{1.73_m2} (ref >=60)
GFR, Est Non African American: 58 mL/min/{1.73_m2} — ABNORMAL LOW (ref >=60)
Globulin: 3.3 g/dL (calc) (ref 1.9–3.7)
Glucose, Bld: 75 mg/dL (ref 65–99)
Potassium: 4.3 mmol/L (ref 3.5–5.3)
Sodium: 136 mmol/L (ref 135–146)
Total Bilirubin: 0.7 mg/dL (ref 0.2–1.2)
Total Protein: 7.4 g/dL (ref 6.1–8.1)

## 2018-11-20 NOTE — Progress Notes (Signed)
Creatinine is borderline elevated.  GFR is 58.  AST elevated-53.  Please notify patient and advise patient to avoid taking tylenol, NSAIDs, and alcohol.  Forward labs to PCP.  Absolute lymphocytes mildly low.  Rest of CBC WNL.

## 2018-11-20 NOTE — Progress Notes (Deleted)
Office Visit Note  Patient: Jennifer Barr             Date of Birth: 1955/11/03           MRN: [id number]             PCP: Alycia Rossetti, MD Referring: Alycia Rossetti, MD Visit Date: 12/04/2018 Occupation: @GUAROCC @  Subjective:  No chief complaint on file.   History of Present Illness: Jennifer Barr is a 63 y.o. female ***   Activities of Daily Living:  Patient reports morning stiffness for *** {minute/hour:19697}.   Patient {ACTIONS;DENIES/REPORTS:21021675::"Denies"} nocturnal pain.  Difficulty dressing/grooming: {ACTIONS;DENIES/REPORTS:21021675::"Denies"} Difficulty climbing stairs: {ACTIONS;DENIES/REPORTS:21021675::"Denies"} Difficulty getting out of chair: {ACTIONS;DENIES/REPORTS:21021675::"Denies"} Difficulty using hands for taps, buttons, cutlery, and/or writing: {ACTIONS;DENIES/REPORTS:21021675::"Denies"}  No Rheumatology ROS completed.   PMFS History:  Patient Active Problem List   Diagnosis Date Noted  . ANA positive 02/10/2016  . Rheumatoid factor positive 02/10/2016  . Primary osteoarthritis, right hand 02/10/2016  . Scoliosis 02/10/2016  . Raynaud's syndrome without gangrene 02/10/2016  . Sjogren's disease ([place]) 05/07/2014  . Vasculitis ([place]) 05/07/2014  . Hashimoto's thyroiditis 05/07/2014  . Acute sinusitis 01/07/2013  . Bronchitis 01/07/2013  . Conjunctivitis 01/07/2013  . Nevus of skin of lip 01/07/2013  . Laryngitis 01/07/2013    Past Medical History:  Diagnosis Date  . Osteopenia 07/2017   T score -1.3 FRAX 7.2% / 0.6%  . Premature ovarian failure   . Sjoegren syndrome    Dr. Estanislado Pandy  . Thyroid disease    Hashiomto  . Vasculitis ([place])    Dr. Estanislado Pandy    Family History  Problem Relation Age of Onset  . Diabetes Mother   . Diabetes Father   . Scleroderma Daughter    No past surgical history on file. Social History   Social History Narrative  . Not on file   Immunization History  Administered Date(s) Administered  .  Influenza,inj,Quad PF,6+ Mos 10/14/2018  . Tdap 06/06/2018     Objective: Vital Signs: There were no vitals taken for this visit.   Physical Exam   Musculoskeletal Exam: ***  CDAI Exam: CDAI Score: - Patient Global: -; Provider Global: - Swollen: -; Tender: - Joint Exam   No joint exam has been documented for this visit   There is currently no information documented on the homunculus. Go to the Rheumatology activity and complete the homunculus joint exam.  Investigation: No additional findings.  Imaging: No results found.  Recent Labs: Lab Results  Component Value Date   WBC 8.4 11/19/2018   HGB 12.3 11/19/2018   PLT 260 11/19/2018   NA 136 11/19/2018   K 4.3 11/19/2018   CL 105 11/19/2018   CO2 22 11/19/2018   GLUCOSE 75 11/19/2018   BUN 17 11/19/2018   CREATININE 1.04 (H) 11/19/2018   BILITOT 0.7 11/19/2018   ALKPHOS 79 08/03/2016   AST 53 (H) 11/19/2018   ALT 11 11/19/2018   PROT 7.4 11/19/2018   ALBUMIN 4.2 08/03/2016   CALCIUM 9.2 11/19/2018   GFRAA 67 11/19/2018    Speciality Comments: PLQ Eye Exam: 04/18/18 WNL @ Marica Otter, OD PA  Procedures:  No procedures performed Allergies: Sulfa antibiotics   Assessment / Plan:     Visit Diagnoses: No diagnosis found.  Orders: No orders of the defined types were placed in this encounter.  No orders of the defined types were placed in this encounter.   Face-to-face time spent with patient was *** minutes. Greater than  50% of time was spent in counseling and coordination of care.  Follow-Up Instructions: No follow-ups on file.   Ofilia Neas, PA-C  Note - This record has been created using Dragon software.  Chart creation errors have been sought, but may not always  have been located. Such creation errors do not reflect on  the standard of medical care.

## 2018-12-04 ENCOUNTER — Ambulatory Visit: Payer: PRIVATE HEALTH INSURANCE | Admitting: Rheumatology

## 2018-12-09 ENCOUNTER — Other Ambulatory Visit: Payer: Self-pay | Admitting: Rheumatology

## 2018-12-09 DIAGNOSIS — M3509 Sicca syndrome with other organ involvement: Secondary | ICD-10-CM

## 2018-12-09 MED FILL — azaTHIOprine 50 MG TABS: 50 | 30 days supply | Qty: 45 | Fill #2

## 2018-12-09 MED FILL — SYNTHROID 100 MCG TABLET: 100 | 30 days supply | Qty: 30 | Fill #1

## 2018-12-09 NOTE — Telephone Encounter (Signed)
Notes recorded by Ofilia Neas, PA-C on 11/20/2018 at 8:34 AM EST  Creatinine is borderline elevated. GFR is 58. AST elevated-53. Please notify patient and advise patient to avoid taking tylenol, NSAIDs, and alcohol. Forward labs to PCP. Absolute lymphocytes mildly low. Rest of CBC WNL.   Last RF PLQ 09/10/2018 Last RF Pilocarpine 06/25/2017 Last appt 06/05/2018 No upcoming appt

## 2018-12-10 MED FILL — PILOCARPINE HCL 5 MG TABLET: 5 | 30 days supply | Qty: 90 | Fill #0

## 2018-12-10 MED FILL — HYDROXYCHLOROQUINE SULFATE: 200 | 30 days supply | Qty: 30 | Fill #0

## 2018-12-10 NOTE — Telephone Encounter (Signed)
Ok to refill 

## 2019-01-06 MED FILL — HYDROXYCHLOROQUINE SULFATE: 200 | 30 days supply | Qty: 30 | Fill #1

## 2019-01-06 MED FILL — SYNTHROID 100 MCG TABLET: 100 | 30 days supply | Qty: 30 | Fill #2

## 2019-01-08 ENCOUNTER — Other Ambulatory Visit: Payer: Self-pay | Admitting: Rheumatology

## 2019-01-08 MED FILL — azaTHIOprine 50 MG TABS: 50 | 30 days supply | Qty: 45 | Fill #0

## 2019-01-08 NOTE — Telephone Encounter (Signed)
Last Visit: 06/05/2018 Next Visit: message sent to the front desk to schedule.  Labs: 11/19/2018 Creatinine is borderline elevated. GFR is 58. AST elevated-53. Absolute lymphocytes mildly low. Rest of CBC WNL.   Okay to refill per Dr. Estanislado Pandy.

## 2019-02-10 MED FILL — HYDROXYCHLOROQUINE SULFATE: 200 | 30 days supply | Qty: 30 | Fill #2

## 2019-02-10 MED FILL — SYNTHROID 100 MCG TABLET: 100 | 30 days supply | Qty: 30 | Fill #3

## 2019-02-10 MED FILL — azaTHIOprine 50 MG TABS: 50 | 30 days supply | Qty: 45 | Fill #1

## 2019-02-17 ENCOUNTER — Other Ambulatory Visit: Payer: Self-pay | Admitting: *Deleted

## 2019-02-17 DIAGNOSIS — Z79899 Other long term (current) drug therapy: Secondary | ICD-10-CM

## 2019-02-18 LAB — CBC WITH DIFFERENTIAL/PLATELET
Absolute Monocytes: 330 cells/uL (ref 200–950)
Basophils Absolute: 20 cells/uL (ref 0–200)
Basophils Relative: 0.4 %
Eosinophils Absolute: 20 cells/uL (ref 15–500)
Eosinophils Relative: 0.4 %
HCT: 37.6 % (ref 35.0–45.0)
Hemoglobin: 12.8 g/dL (ref 11.7–15.5)
Lymphs Abs: 540 cells/uL — ABNORMAL LOW (ref 850–3900)
MCH: 29.1 pg (ref 27.0–33.0)
MCHC: 34 g/dL (ref 32.0–36.0)
MCV: 85.5 fL (ref 80.0–100.0)
MPV: 10 fL (ref 7.5–12.5)
Monocytes Relative: 6.6 %
Neutro Abs: 4090 cells/uL (ref 1500–7800)
Neutrophils Relative %: 81.8 %
Platelets: 290 10*3/uL (ref 140–400)
RBC: 4.4 10*6/uL (ref 3.80–5.10)
RDW: 13.1 % (ref 11.0–15.0)
Total Lymphocyte: 10.8 %
WBC: 5 10*3/uL (ref 3.8–10.8)

## 2019-02-18 LAB — COMPLETE METABOLIC PANEL WITH GFR
AG Ratio: 1.3 (calc) (ref 1.0–2.5)
ALT: 9 U/L (ref 6–29)
AST: 40 U/L — ABNORMAL HIGH (ref 10–35)
Albumin: 4.3 g/dL (ref 3.6–5.1)
Alkaline phosphatase (APISO): 68 U/L (ref 37–153)
BUN: 13 mg/dL (ref 7–25)
CO2: 25 mmol/L (ref 20–32)
Calcium: 9.6 mg/dL (ref 8.6–10.4)
Chloride: 105 mmol/L (ref 98–110)
Creat: 0.84 mg/dL (ref 0.50–0.99)
GFR, Est African American: 86 mL/min/{1.73_m2} (ref >=60)
GFR, Est Non African American: 74 mL/min/{1.73_m2} (ref >=60)
Globulin: 3.3 g/dL (calc) (ref 1.9–3.7)
Glucose, Bld: 99 mg/dL (ref 65–99)
Potassium: 4.2 mmol/L (ref 3.5–5.3)
Sodium: 138 mmol/L (ref 135–146)
Total Bilirubin: 0.7 mg/dL (ref 0.2–1.2)
Total Protein: 7.6 g/dL (ref 6.1–8.1)

## 2019-02-18 NOTE — Progress Notes (Signed)
Labs are stable. AST mildly elevated but trending down.

## 2019-03-08 ENCOUNTER — Other Ambulatory Visit: Payer: Self-pay | Admitting: Physician Assistant

## 2019-03-08 DIAGNOSIS — M3509 Sicca syndrome with other organ involvement: Secondary | ICD-10-CM

## 2019-03-08 MED FILL — SYNTHROID 100 MCG TABLET: 100 | 30 days supply | Qty: 30 | Fill #4

## 2019-03-10 MED FILL — HYDROXYCHLOROQUINE SULFATE: 200 | 30 days supply | Qty: 30 | Fill #0

## 2019-03-10 NOTE — Telephone Encounter (Signed)
Last Visit: 06/05/2018 Next Visit: 03/18/2019 Labs: 02/17/2019 Labs are stable. AST mildly elevated but trending down.  PLQ Eye Exam: 04/18/18 WNL   Okay to refill per Dr. Estanislado Pandy

## 2019-03-11 NOTE — Progress Notes (Signed)
Office Visit Note  Patient: Jennifer Barr             Date of Birth: [birth year]           MRN: [id number]             PCP: Alycia Rossetti, MD Referring: Alycia Rossetti, MD Visit Date: 03/18/2019 Occupation: @GUAROCC @  Subjective:  Medication monitoring   History of Present Illness: Jennifer Barr is a 64 y.o. female with history of Sjogren's syndrome and vasculitis.  She states she has not had a flare of vasculitis since July last year.  She recalls having 2 alcoholic beverages in the last year and both times she had a flare.  She has not been drinking alcohol anymore.  She also had an episode of numbness in her right ankle which lasted for some time and then resolved.  She denies bilateral lower extremity numbness.  She denies any joint swelling.  She continues to have sicca symptoms.  She has been wearing contact lenses to prevent her eye irritation.  Her fatigue has improved since she retired.  Activities of Daily Living:  Patient reports morning stiffness for 0 none.   Patient Denies nocturnal pain.  Difficulty dressing/grooming: Denies Difficulty climbing stairs: Denies Difficulty getting out of chair: Denies Difficulty using hands for taps, buttons, cutlery, and/or writing: Denies  Review of Systems  Constitutional: Negative for fatigue, night sweats, weight gain and weight loss.  HENT: Positive for mouth dryness. Negative for mouth sores, trouble swallowing, trouble swallowing and nose dryness.   Eyes: Positive for dryness. Negative for pain, redness and visual disturbance.  Respiratory: Negative for cough, shortness of breath and difficulty breathing.   Cardiovascular: Positive for swelling in legs/feet. Negative for chest pain, palpitations, hypertension and irregular heartbeat.  Gastrointestinal: Negative for blood in stool, constipation and diarrhea.  Endocrine: Negative for excessive thirst and increased urination.  Genitourinary: Negative for difficulty urinating  and vaginal dryness.  Musculoskeletal: Negative for arthralgias, joint pain, joint swelling, myalgias, muscle weakness, morning stiffness, muscle tenderness and myalgias.  Skin: Positive for color change. Negative for rash, hair loss, skin tightness, ulcers and sensitivity to sunlight.  Allergic/Immunologic: Negative for susceptible to infections.  Neurological: Positive for numbness. Negative for dizziness, memory loss, night sweats and weakness.  Hematological: Negative for bruising/bleeding tendency and swollen glands.  Psychiatric/Behavioral: Negative for depressed mood and sleep disturbance. The patient is not nervous/anxious.     PMFS History:  Patient Active Problem List   Diagnosis Date Noted  . ANA positive 02/10/2016  . Rheumatoid factor positive 02/10/2016  . Primary osteoarthritis, right hand 02/10/2016  . Scoliosis 02/10/2016  . Raynaud's syndrome without gangrene 02/10/2016  . Sjogren's disease ([place]) 05/07/2014  . Vasculitis ([place]) 05/07/2014  . Hashimoto's thyroiditis 05/07/2014  . Acute sinusitis 01/07/2013  . Bronchitis 01/07/2013  . Conjunctivitis 01/07/2013  . Nevus of skin of lip 01/07/2013  . Laryngitis 01/07/2013    Past Medical History:  Diagnosis Date  . Osteopenia 07/2017   T score -1.3 FRAX 7.2% / 0.6%  . Premature ovarian failure   . Sjoegren syndrome    Dr. Estanislado Pandy  . Thyroid disease    Hashiomto  . Vasculitis ([place])    Dr. Estanislado Pandy    Family History  Problem Relation Age of Onset  . Diabetes Mother   . Diabetes Father   . Scleroderma Daughter    History reviewed. No pertinent surgical history. Social History   Social History Narrative  .  Not on file   Immunization History  Administered Date(s) Administered  . Influenza,inj,Quad PF,6+ Mos 10/14/2018  . Tdap 06/06/2018     Objective: Vital Signs: BP 117/63 (BP Location: Left Arm, Patient Position: Sitting, Cuff Size: Normal)   Pulse 69   Resp 14   Ht 5' 8.5" (1.74 m)   Wt 158 lb  9.6 oz (71.9 kg)   BMI 23.76 kg/m    Physical Exam Vitals and nursing note reviewed.  Constitutional:      Appearance: She is well-developed.  HENT:     Head: Normocephalic and atraumatic.  Eyes:     Conjunctiva/sclera: Conjunctivae normal.  Cardiovascular:     Rate and Rhythm: Normal rate and regular rhythm.     Heart sounds: Normal heart sounds.  Pulmonary:     Effort: Pulmonary effort is normal.     Breath sounds: Normal breath sounds.  Abdominal:     General: Bowel sounds are normal.     Palpations: Abdomen is soft.  Musculoskeletal:     Cervical back: Normal range of motion.  Lymphadenopathy:     Cervical: No cervical adenopathy.  Skin:    General: Skin is warm and dry.     Capillary Refill: Capillary refill takes less than 2 seconds.  Neurological:     Mental Status: She is alert and oriented to person, place, and time.  Psychiatric:        Behavior: Behavior normal.      Musculoskeletal Exam: C-spine thoracic and lumbar spine with good range of motion.  Shoulder joints, elbow joints, wrist joints, MCPs PIPs and DIPs with good range of motion with no synovitis.  Hip joints, knee joints, ankles, MTPs and PIPs with good range of motion with no synovitis.  CDAI Exam: CDAI Score: - Patient Global: -; Provider Global: - Swollen: -; Tender: - Joint Exam 03/18/2019   No joint exam has been documented for this visit   There is currently no information documented on the homunculus. Go to the Rheumatology activity and complete the homunculus joint exam.  Investigation: No additional findings.  Imaging: No results found.  Recent Labs: Lab Results  Component Value Date   WBC 5.0 02/17/2019   HGB 12.8 02/17/2019   PLT 290 02/17/2019   NA 138 02/17/2019   K 4.2 02/17/2019   CL 105 02/17/2019   CO2 25 02/17/2019   GLUCOSE 99 02/17/2019   BUN 13 02/17/2019   CREATININE 0.84 02/17/2019   BILITOT 0.7 02/17/2019   ALKPHOS 79 08/03/2016   AST 40 (H) 02/17/2019    ALT 9 02/17/2019   PROT 7.6 02/17/2019   ALBUMIN 4.2 08/03/2016   CALCIUM 9.6 02/17/2019   GFRAA 86 02/17/2019    Speciality Comments: PLQ Eye Exam: 04/18/18 WNL @ Marica Otter, OD PA  Procedures:  No procedures performed Allergies: Sulfa antibiotics   Assessment / Plan:     Visit Diagnoses: Sjogren's syndrome with other organ involvement ([place]) - Positive ANA, positive Ro, positive La, positive RF.  She is on Imuran and pilocarpine which is controlling her symptoms fairly well.  She continues to have some sicca symptoms.  She has been using over-the-counter products.  High risk medication use - alternating Imuran 50 mg and 75 mg every other day.PLQ 200 mg by mouth daily. PLQ eye exam: 04/18/18  Vasculitis ([facility])-she had 2 episodes last year.  She has not had any recurrence since last July.  Her eye exam was April 18, 2018.  Raynaud's syndrome without gangrene-she  has mild Raynaud's symptoms.  Primary osteoarthritis of both hands-joint protection was discussed.  Scoliosis-she has scoliosis but not symptomatic.  History of hypothyroidism  Orders: No orders of the defined types were placed in this encounter.  No orders of the defined types were placed in this encounter.    Follow-Up Instructions: Return in about 5 months (around 08/18/2019) for Sjogren's.   Bo Merino, MD  Note - This record has been created using Editor, commissioning.  Chart creation errors have been sought, but may not always  have been located. Such creation errors do not reflect on  the standard of medical care.

## 2019-03-18 ENCOUNTER — Encounter: Payer: Self-pay | Admitting: Rheumatology

## 2019-03-18 ENCOUNTER — Other Ambulatory Visit: Payer: Self-pay

## 2019-03-18 ENCOUNTER — Ambulatory Visit: Payer: PRIVATE HEALTH INSURANCE | Admitting: Rheumatology

## 2019-03-18 VITALS — BP 117/63 | HR 69 | Resp 14 | Ht 68.5 in | Wt 158.6 lb

## 2019-03-18 DIAGNOSIS — Z8639 Personal history of other endocrine, nutritional and metabolic disease: Secondary | ICD-10-CM

## 2019-03-18 DIAGNOSIS — M3509 Sicca syndrome with other organ involvement: Secondary | ICD-10-CM

## 2019-03-18 DIAGNOSIS — M19042 Primary osteoarthritis, left hand: Secondary | ICD-10-CM

## 2019-03-18 DIAGNOSIS — M19041 Primary osteoarthritis, right hand: Secondary | ICD-10-CM

## 2019-03-18 DIAGNOSIS — I7300 Raynaud's syndrome without gangrene: Secondary | ICD-10-CM

## 2019-03-18 DIAGNOSIS — M419 Scoliosis, unspecified: Secondary | ICD-10-CM

## 2019-03-18 DIAGNOSIS — I776 Arteritis, unspecified: Secondary | ICD-10-CM

## 2019-03-18 DIAGNOSIS — Z79899 Other long term (current) drug therapy: Secondary | ICD-10-CM

## 2019-03-18 NOTE — Patient Instructions (Signed)
Standing Labs We placed an order today for your standing lab work.    Please come back and get your standing labs in May and every 3 years  We have open lab daily Monday through Thursday from 8:30-12:30 PM and 1:30-4:30 PM and Friday from 8:30-12:30 PM and 1:30-4:00 PM at the office of Dr. Bo Merino.   You may experience shorter wait times on Monday and Friday afternoons. The office is located at [address] [place], [place], [place] [address] No appointment is necessary.   Labs are drawn by [facility].  You may receive a bill from [facility] for your lab work.  If you wish to have your labs drawn at another location, please call the office 24 hours in advance to send orders.  If you have any questions regarding directions or hours of operation,  please call [phone].   Just as a reminder please drink plenty of water prior to coming for your lab work. Thanks!

## 2019-04-04 ENCOUNTER — Other Ambulatory Visit: Payer: Self-pay | Admitting: Rheumatology

## 2019-04-04 DIAGNOSIS — M3509 Sicca syndrome with other organ involvement: Secondary | ICD-10-CM

## 2019-04-04 MED FILL — azaTHIOprine 50 MG TABS: 50 | 30 days supply | Qty: 45 | Fill #2

## 2019-04-04 MED FILL — PILOCARPINE HCL 5 MG TABLET: 5 | 30 days supply | Qty: 90 | Fill #1

## 2019-04-04 MED FILL — SYNTHROID 100 MCG TABLET: 100 | 30 days supply | Qty: 30 | Fill #5

## 2019-04-04 NOTE — Telephone Encounter (Signed)
Last Visit: 03/18/19 Next Visit: 09/16/19  Labs: 02/17/19 Labs are stable. AST mildly elevated but trending down Eye exam: 04/18/18 WNL  Okay to refill per Dr. Estanislado Pandy

## 2019-04-07 MED FILL — HYDROXYCHLOROQUINE SULFATE: 200 | 30 days supply | Qty: 30 | Fill #0

## 2019-05-07 ENCOUNTER — Other Ambulatory Visit: Payer: Self-pay | Admitting: Rheumatology

## 2019-05-07 MED FILL — HYDROXYCHLOROQUINE SULFATE: 200 | 30 days supply | Qty: 30 | Fill #1

## 2019-05-07 MED FILL — SYNTHROID 100 MCG TABLET: 100 | 30 days supply | Qty: 30 | Fill #6

## 2019-05-07 NOTE — Telephone Encounter (Addendum)
Last Visit: 03/18/2019 Next Visit: 09/16/2019 Labs: 02/17/2019 Labs are stable. AST mildly elevated but trending down.   Dose: alternating Imuran 50 mg and 75 mg every other day.  Okay to refill imuran at dose mentioned above?

## 2019-05-08 MED FILL — azaTHIOprine 50 MG TABS: 50 | 28 days supply | Qty: 34 | Fill #0

## 2019-05-08 NOTE — Telephone Encounter (Signed)
Ok to refill Imuran 50 mg alternating with 75 mg every other day.

## 2019-05-27 ENCOUNTER — Other Ambulatory Visit: Payer: Self-pay

## 2019-05-27 ENCOUNTER — Other Ambulatory Visit: Payer: Self-pay | Admitting: Pharmacist

## 2019-05-27 DIAGNOSIS — Z79899 Other long term (current) drug therapy: Secondary | ICD-10-CM

## 2019-05-27 DIAGNOSIS — R300 Dysuria: Secondary | ICD-10-CM

## 2019-05-27 NOTE — Progress Notes (Signed)
Patient reports symptoms of UTI while in office for labs.  Dr. Estanislado Pandy requested orders be placed for UA.   Mariella Saa, PharmD, [place], [facility] Pharmacist [phone]  05/27/2019 10:47 AM

## 2019-05-28 ENCOUNTER — Other Ambulatory Visit: Payer: Self-pay

## 2019-05-28 DIAGNOSIS — Z79899 Other long term (current) drug therapy: Secondary | ICD-10-CM

## 2019-05-28 LAB — URINALYSIS, ROUTINE W REFLEX MICROSCOPIC
Bacteria, UA: NONE SEEN /HPF
Bilirubin Urine: NEGATIVE
Glucose, UA: NEGATIVE
Hgb urine dipstick: NEGATIVE
Hyaline Cast: NONE SEEN /LPF
Ketones, ur: NEGATIVE
Nitrite: NEGATIVE
Protein, ur: NEGATIVE
RBC / HPF: NONE SEEN /HPF (ref 0–2)
Specific Gravity, Urine: 1.01 (ref 1.001–1.03)
Squamous Epithelial / HPF: NONE SEEN /HPF (ref <=5)
pH: 7 (ref 5.0–8.0)

## 2019-05-28 LAB — CBC WITH DIFFERENTIAL/PLATELET
Absolute Monocytes: 313 cells/uL (ref 200–950)
Basophils Absolute: 18 cells/uL (ref 0–200)
Basophils Relative: 0.4 %
Eosinophils Absolute: 18 cells/uL (ref 15–500)
Eosinophils Relative: 0.4 %
HCT: 40.3 % (ref 35.0–45.0)
Hemoglobin: 13 g/dL (ref 11.7–15.5)
Lymphs Abs: 474 cells/uL — ABNORMAL LOW (ref 850–3900)
MCH: 28 pg (ref 27.0–33.0)
MCHC: 32.3 g/dL (ref 32.0–36.0)
MCV: 86.9 fL (ref 80.0–100.0)
MPV: 9.8 fL (ref 7.5–12.5)
Monocytes Relative: 6.8 %
Neutro Abs: 3777 cells/uL (ref 1500–7800)
Neutrophils Relative %: 82.1 %
Platelets: 239 10*3/uL (ref 140–400)
RBC: 4.64 10*6/uL (ref 3.80–5.10)
RDW: 13.1 % (ref 11.0–15.0)
Total Lymphocyte: 10.3 %
WBC: 4.6 10*3/uL (ref 3.8–10.8)

## 2019-05-28 LAB — COMPLETE METABOLIC PANEL WITH GFR
AG Ratio: 1.1 (calc) (ref 1.0–2.5)
ALT: 9 U/L (ref 6–29)
AST: 39 U/L — ABNORMAL HIGH (ref 10–35)
Albumin: 4 g/dL (ref 3.6–5.1)
Alkaline phosphatase (APISO): 67 U/L (ref 37–153)
BUN: 14 mg/dL (ref 7–25)
CO2: 27 mmol/L (ref 20–32)
Calcium: 9.1 mg/dL (ref 8.6–10.4)
Chloride: 102 mmol/L (ref 98–110)
Creat: 0.92 mg/dL (ref 0.50–0.99)
GFR, Est African American: 77 mL/min/{1.73_m2} (ref >=60)
GFR, Est Non African American: 66 mL/min/{1.73_m2} (ref >=60)
Globulin: 3.6 g/dL (calc) (ref 1.9–3.7)
Glucose, Bld: 89 mg/dL (ref 65–99)
Potassium: 4.6 mmol/L (ref 3.5–5.3)
Sodium: 135 mmol/L (ref 135–146)
Total Bilirubin: 0.7 mg/dL (ref 0.2–1.2)
Total Protein: 7.6 g/dL (ref 6.1–8.1)

## 2019-05-28 NOTE — Progress Notes (Signed)
UA showed some white cells but no bacteria.  CMP is normal except for mild elevation of LFTs which is unchanged.Lymphocyte count is lower than 500.  Please advise patient to hold Plaquenil.  Patient repeat CBC in 4 weeks.

## 2019-06-03 ENCOUNTER — Telehealth: Payer: Self-pay | Admitting: Family Medicine

## 2019-06-03 MED ORDER — CEPHALEXIN 500 MG PO CAPS
500.0000 mg | ORAL_CAPSULE | Freq: Four times a day (QID) | ORAL | 0 refills | Status: DC
Start: 2019-06-03 — End: 2019-06-17

## 2019-06-03 MED FILL — CEPHALEXIN 500 MG CAPSULE: 500 | 5 days supply | Qty: 20 | Fill #0

## 2019-06-03 NOTE — Telephone Encounter (Signed)
Call placed to patient and patient made aware.  

## 2019-06-03 NOTE — Telephone Encounter (Signed)
Keflex sent to pharmacy If not improved in 1 week, needs repeat UA/urine culture

## 2019-06-03 NOTE — Telephone Encounter (Signed)
CB# [phone] Was seen by Estanislado Pandy MD pt had UA dos 05/27/19 showed some white cell but no bacteria wasn't  prescribe medication still having urine issues was to told contact her PCP would like to check if Dr.[place] can prescribe medication, by reviewing her lab results on 05/27/19

## 2019-06-03 NOTE — Telephone Encounter (Signed)
Call placed to patient to inquire.   Reports that she has x3 days of increased frequency.   Denies dysuria, urgency, flank pain, fever.   States that she will not be able to come to office for visit until 06/11/2019.   Requested MD to advise.

## 2019-06-10 MED FILL — PILOCARPINE HCL 5 MG TABLET: 5 | 30 days supply | Qty: 90 | Fill #2

## 2019-06-10 MED FILL — SYNTHROID 100 MCG TABLET: 100 | 30 days supply | Qty: 30 | Fill #7

## 2019-06-10 MED FILL — azaTHIOprine 50 MG TABS: 50 | 28 days supply | Qty: 34 | Fill #1

## 2019-06-16 ENCOUNTER — Encounter: Payer: Self-pay | Admitting: Obstetrics & Gynecology

## 2019-06-16 ENCOUNTER — Other Ambulatory Visit: Payer: Self-pay

## 2019-06-17 ENCOUNTER — Encounter: Payer: Self-pay | Admitting: Obstetrics & Gynecology

## 2019-06-17 ENCOUNTER — Ambulatory Visit (INDEPENDENT_AMBULATORY_CARE_PROVIDER_SITE_OTHER): Payer: 59 | Admitting: Obstetrics & Gynecology

## 2019-06-17 VITALS — BP 110/70 | Ht 68.0 in | Wt 150.0 lb

## 2019-06-17 DIAGNOSIS — M3509 Sicca syndrome with other organ involvement: Secondary | ICD-10-CM

## 2019-06-17 DIAGNOSIS — Z01419 Encounter for gynecological examination (general) (routine) without abnormal findings: Secondary | ICD-10-CM | POA: Diagnosis not present

## 2019-06-17 DIAGNOSIS — Z780 Asymptomatic menopausal state: Secondary | ICD-10-CM | POA: Diagnosis not present

## 2019-06-17 NOTE — Progress Notes (Signed)
Jennifer Barr 12/24/1955 [id number]   History:    64 y.o. M3T5H7C1 Married.  Just retired.  3 grand-children.  Babysitting 2 of her grand-children.  RP:  Established patient presenting for annual gyn exam   HPI: Menopause, well on no HRT.  No PMB.  No pelvic pain.  Dryness with IC.  Urine/BMs normal.  Breasts normal.  BMI 22.81.  Very fit.  Healthy nutrition.  Colonoscopy 2019.  Hypothyroidism on Synthroid. Sjogren's Disease.  Past medical history,surgical history, family history and social history were all reviewed and documented in the EPIC chart.  Gynecologic History No LMP recorded. Patient is postmenopausal.  Obstetric History OB History  Gravida Para Term Preterm AB Living  4 3   1   3   SAB TAB Ectopic Multiple Live Births               # Outcome Date GA Lbr Len/2nd Weight Sex Delivery Anes PTL Lv  4 Gravida           3 Para           2 Para           1 Preterm              ROS: A ROS was performed and pertinent positives and negatives are included in the history.  GENERAL: No fevers or chills. HEENT: No change in vision, no earache, sore throat or sinus congestion. NECK: No pain or stiffness. CARDIOVASCULAR: No chest pain or pressure. No palpitations. PULMONARY: No shortness of breath, cough or wheeze. GASTROINTESTINAL: No abdominal pain, nausea, vomiting or diarrhea, melena or bright red blood per rectum. GENITOURINARY: No urinary frequency, urgency, hesitancy or dysuria. MUSCULOSKELETAL: No joint or muscle pain, no back pain, no recent trauma. DERMATOLOGIC: No rash, no itching, no lesions. ENDOCRINE: No polyuria, polydipsia, no heat or cold intolerance. No recent change in weight. HEMATOLOGICAL: No anemia or easy bruising or bleeding. NEUROLOGIC: No headache, seizures, numbness, tingling or weakness. PSYCHIATRIC: No depression, no loss of interest in normal activity or change in sleep pattern.     Exam:   BP 110/70   Ht 5\' 8"  (1.727 m)   Wt 150 lb (68 kg)   BMI  22.81 kg/m   Body mass index is 22.81 kg/m.  General appearance : Well developed well nourished female. No acute distress HEENT: Eyes: no retinal hemorrhage or exudates,  Neck supple, trachea midline, no carotid bruits, no thyroidmegaly Lungs: Clear to auscultation, no rhonchi or wheezes, or rib retractions  Heart: Regular rate and rhythm, no murmurs or gallops Breast:Examined in sitting and supine position were symmetrical in appearance, no palpable masses or tenderness,  no skin retraction, no nipple inversion, no nipple discharge, no skin discoloration, no axillary or supraclavicular lymphadenopathy Abdomen: no palpable masses or tenderness, no rebound or guarding Extremities: no edema or skin discoloration or tenderness  Pelvic: Vulva: Normal             Vagina: No gross lesions or discharge  Cervix: No gross lesions or discharge  Uterus  AV, normal size, shape and consistency, non-tender and mobile  Adnexa  Without masses or tenderness  Anus: Normal   Assessment/Plan:  64 y.o. female for annual exam   1. Well female exam with routine gynecological exam Normal gynecologic exam.  No indication to repeat a Pap test this year.  Breast exam normal.  Overdue for a screening mammogram, will schedule.  Colonoscopy 2019.  Fasting health labs with family  physician.  2. Postmenopause Well on no hormone replacement therapy.  No postmenopausal bleeding.  May use coconut oil for intercourse.  Recommend vitamin D supplements, calcium intake of 1200 mg daily and regular weightbearing physical activities.  Mild osteopenia with lowest T score at -1.3 at the body of the bilateral femoral neck.  We will repeat a bone density at 4 years July 2022.  3. Sjogren's syndrome with other organ involvement ([facility])  Princess Bruins MD, 12:28 PM 06/17/2019

## 2019-06-20 ENCOUNTER — Other Ambulatory Visit: Payer: Self-pay | Admitting: Obstetrics & Gynecology

## 2019-06-20 DIAGNOSIS — Z1231 Encounter for screening mammogram for malignant neoplasm of breast: Secondary | ICD-10-CM

## 2019-06-23 ENCOUNTER — Encounter: Payer: Self-pay | Admitting: Obstetrics & Gynecology

## 2019-06-23 NOTE — Patient Instructions (Signed)
1. Well female exam with routine gynecological exam Normal gynecologic exam.  No indication to repeat a Pap test this year.  Breast exam normal.  Overdue for a screening mammogram, will schedule.  Colonoscopy 2019.  Fasting health labs with family physician.  2. Postmenopause Well on no hormone replacement therapy.  No postmenopausal bleeding.  May use coconut oil for intercourse.  Recommend vitamin D supplements, calcium intake of 1200 mg daily and regular weightbearing physical activities.  Mild osteopenia with lowest T score at -1.3 at the body of the bilateral femoral neck.  We will repeat a bone density at 3 years July 2022.  3. Sjogren's syndrome with other organ involvement [facility])  Miaa, it was a pleasure seeing you today!

## 2019-07-07 MED FILL — azaTHIOprine 50 MG TABS: 50 | 28 days supply | Qty: 34 | Fill #2

## 2019-07-07 MED FILL — SYNTHROID 100 MCG TABLET: 100 | 30 days supply | Qty: 30 | Fill #8

## 2019-07-08 ENCOUNTER — Other Ambulatory Visit: Payer: Self-pay

## 2019-07-08 ENCOUNTER — Ambulatory Visit
Admission: RE | Admit: 2019-07-08 | Discharge: 2019-07-08 | Disposition: A | Discharge status: Home / Self Care | Payer: PRIVATE HEALTH INSURANCE | Source: Ambulatory Visit | Attending: Obstetrics & Gynecology | Admitting: Obstetrics & Gynecology

## 2019-07-08 DIAGNOSIS — Z1231 Encounter for screening mammogram for malignant neoplasm of breast: Secondary | ICD-10-CM

## 2019-07-08 DIAGNOSIS — Z79899 Other long term (current) drug therapy: Secondary | ICD-10-CM

## 2019-07-08 DIAGNOSIS — M3509 Sicca syndrome with other organ involvement: Secondary | ICD-10-CM

## 2019-07-08 LAB — COMPLETE METABOLIC PANEL WITH GFR
AG Ratio: 1.2 (calc) (ref 1.0–2.5)
ALT: 7 U/L (ref 6–29)
AST: 36 U/L — ABNORMAL HIGH (ref 10–35)
Albumin: 4.2 g/dL (ref 3.6–5.1)
Alkaline phosphatase (APISO): 75 U/L (ref 37–153)
BUN: 14 mg/dL (ref 7–25)
CO2: 25 mmol/L (ref 20–32)
Calcium: 9.1 mg/dL (ref 8.6–10.4)
Chloride: 104 mmol/L (ref 98–110)
Creat: 0.8 mg/dL (ref 0.50–0.99)
GFR, Est African American: 91 mL/min/{1.73_m2} (ref >=60)
GFR, Est Non African American: 78 mL/min/{1.73_m2} (ref >=60)
Globulin: 3.5 g/dL (calc) (ref 1.9–3.7)
Glucose, Bld: 96 mg/dL (ref 65–99)
Potassium: 4.4 mmol/L (ref 3.5–5.3)
Sodium: 136 mmol/L (ref 135–146)
Total Bilirubin: 0.5 mg/dL (ref 0.2–1.2)
Total Protein: 7.7 g/dL (ref 6.1–8.1)

## 2019-07-08 LAB — CBC WITH DIFFERENTIAL/PLATELET
Absolute Monocytes: 322 cells/uL (ref 200–950)
Basophils Absolute: 19 cells/uL (ref 0–200)
Basophils Relative: 0.6 %
Eosinophils Absolute: 19 cells/uL (ref 15–500)
Eosinophils Relative: 0.6 %
HCT: 38.2 % (ref 35.0–45.0)
Hemoglobin: 12.5 g/dL (ref 11.7–15.5)
Lymphs Abs: 502 cells/uL — ABNORMAL LOW (ref 850–3900)
MCH: 28.3 pg (ref 27.0–33.0)
MCHC: 32.7 g/dL (ref 32.0–36.0)
MCV: 86.6 fL (ref 80.0–100.0)
MPV: 9.5 fL (ref 7.5–12.5)
Monocytes Relative: 10.4 %
Neutro Abs: 2238 cells/uL (ref 1500–7800)
Neutrophils Relative %: 72.2 %
Platelets: 267 10*3/uL (ref 140–400)
RBC: 4.41 10*6/uL (ref 3.80–5.10)
RDW: 13.2 % (ref 11.0–15.0)
Total Lymphocyte: 16.2 %
WBC: 3.1 10*3/uL — ABNORMAL LOW (ref 3.8–10.8)

## 2019-07-09 NOTE — Progress Notes (Signed)
AST is borderline elevated but trending down.  Rest of CMP WNL.   WBC count is low-3.1 and trending down.  Absolute lymphocytes are low.  Please advise the patient to return in 1 month to recheck lab work.   Reviewed lab work with Dr. Estanislado Pandy.  Continue current treatment regimen.

## 2019-07-10 NOTE — Progress Notes (Signed)
Dr. Estanislado Pandy would like the patient to continue holding PLQ and return for lab work in 1 month.    It is unclear if her lymphopenia is due to underlying autoimmune disease or due to the immunosuppressive agents she is taking.  Please add a sed rate to her upcoming lab work in 1 month.

## 2019-07-31 ENCOUNTER — Other Ambulatory Visit: Payer: Self-pay | Admitting: *Deleted

## 2019-07-31 MED ORDER — AZATHIOPRINE 50 MG PO TABS: ORAL_TABLET | ORAL | 0 refills | Status: DC

## 2019-07-31 MED FILL — azaTHIOprine 50 MG TABS: 50 | 30 days supply | Qty: 38 | Fill #0

## 2019-07-31 NOTE — Telephone Encounter (Signed)
Last Visit: 03/18/2019 Next Visit: 09/16/2019 Labs: 07/08/2019 AST is borderline elevated but trending down. Rest of CMP WNL. WBC count is low-3.1 and trending down. Absolute lymphocytes are low.   Dose: alternating Imuran 50 mg and 75 mg every other day.  Okay to refill Imuran?

## 2019-08-04 MED FILL — SYNTHROID 100 MCG TABLET: 100 | 30 days supply | Qty: 30 | Fill #9

## 2019-08-11 ENCOUNTER — Other Ambulatory Visit: Payer: Self-pay | Admitting: *Deleted

## 2019-08-11 DIAGNOSIS — Z79899 Other long term (current) drug therapy: Secondary | ICD-10-CM

## 2019-08-11 DIAGNOSIS — M3509 Sicca syndrome with other organ involvement: Secondary | ICD-10-CM

## 2019-08-11 LAB — COMPLETE METABOLIC PANEL WITH GFR
AG Ratio: 1.1 (calc) (ref 1.0–2.5)
ALT: 14 U/L (ref 6–29)
AST: 52 U/L — ABNORMAL HIGH (ref 10–35)
Albumin: 4.1 g/dL (ref 3.6–5.1)
Alkaline phosphatase (APISO): 70 U/L (ref 37–153)
BUN: 16 mg/dL (ref 7–25)
CO2: 24 mmol/L (ref 20–32)
Calcium: 9.3 mg/dL (ref 8.6–10.4)
Chloride: 104 mmol/L (ref 98–110)
Creat: 0.96 mg/dL (ref 0.50–0.99)
GFR, Est African American: 73 mL/min/{1.73_m2} (ref >=60)
GFR, Est Non African American: 63 mL/min/{1.73_m2} (ref >=60)
Globulin: 3.9 g/dL (calc) — ABNORMAL HIGH (ref 1.9–3.7)
Glucose, Bld: 85 mg/dL (ref 65–139)
Potassium: 4.4 mmol/L (ref 3.5–5.3)
Sodium: 135 mmol/L (ref 135–146)
Total Bilirubin: 1 mg/dL (ref 0.2–1.2)
Total Protein: 8 g/dL (ref 6.1–8.1)

## 2019-08-11 LAB — CBC WITH DIFFERENTIAL/PLATELET
Absolute Monocytes: 350 cells/uL (ref 200–950)
Basophils Absolute: 9 cells/uL (ref 0–200)
Basophils Relative: 0.2 %
Eosinophils Absolute: 9 cells/uL — ABNORMAL LOW (ref 15–500)
Eosinophils Relative: 0.2 %
HCT: 40.6 % (ref 35.0–45.0)
Hemoglobin: 13.1 g/dL (ref 11.7–15.5)
Lymphs Abs: 377 cells/uL — ABNORMAL LOW (ref 850–3900)
MCH: 28.2 pg (ref 27.0–33.0)
MCHC: 32.3 g/dL (ref 32.0–36.0)
MCV: 87.5 fL (ref 80.0–100.0)
MPV: 9.7 fL (ref 7.5–12.5)
Monocytes Relative: 7.6 %
Neutro Abs: 3855 cells/uL (ref 1500–7800)
Neutrophils Relative %: 83.8 %
Platelets: 242 10*3/uL (ref 140–400)
RBC: 4.64 10*6/uL (ref 3.80–5.10)
RDW: 13.6 % (ref 11.0–15.0)
Total Lymphocyte: 8.2 %
WBC: 4.6 10*3/uL (ref 3.8–10.8)

## 2019-08-11 LAB — SEDIMENTATION RATE: Sed Rate: 36 mm/h — ABNORMAL HIGH (ref 0–30)

## 2019-08-12 ENCOUNTER — Telehealth: Payer: Self-pay | Admitting: *Deleted

## 2019-08-12 DIAGNOSIS — D72810 Lymphocytopenia: Secondary | ICD-10-CM

## 2019-08-12 NOTE — Progress Notes (Signed)
I had a detailed discussion with Dr. Irene Limbo regarding Ms.Thorley.  I discussed neutropenia and lymphopenia with Dr. Irene Limbo.  We had to reduce immunosuppressive therapy due to low white cell count and low lymphocyte count.  She is off Plaquenil now.  She is on a low-dose of Imuran.  To evaluate for other causes of lymphopenia we will refer her to Dr. Irene Limbo.  Please discuss with the patient.

## 2019-08-12 NOTE — Telephone Encounter (Signed)
Referral has been placed. 

## 2019-08-12 NOTE — Progress Notes (Signed)
AST is elevated and trending up.  Absolute lymphocyte count is low-377.  WBC count is WNL.  She is currently taking imuran 50 mg alternating with 75 mg every other day.  She may need to further reduce the dose of Imuran. Dr. Estanislado Pandy has reached out to Dr. Irene Limbo for further recommendation.

## 2019-08-12 NOTE — Telephone Encounter (Signed)
-----   Message from Bo Merino, MD sent at 08/12/2019  2:21 PM EDT ----- I had a detailed discussion with Dr. Irene Limbo regarding Jennifer Barr.  I discussed neutropenia and lymphopenia with Dr. Irene Limbo.  We had to reduce immunosuppressive therapy due to low white cell count and low lymphocyte count.  She is off Plaquenil now.  She is  on a low-dose of Imuran.  To evaluate for other causes of lymphopenia we will refer her to Dr. Irene Limbo.  Please discuss with the patient.

## 2019-08-20 ENCOUNTER — Telehealth: Payer: Self-pay | Admitting: Hematology

## 2019-08-20 NOTE — Telephone Encounter (Signed)
Received a new hem referral from Dr. Estanislado Pandy for lymphopenia. Ms. Moretto returned my call and has been scheduled to see Dr. Irene Limbo on 8/23 at 11am. Pt aware to arrive 15 minutes early.

## 2019-09-01 ENCOUNTER — Other Ambulatory Visit: Payer: Self-pay

## 2019-09-01 ENCOUNTER — Telehealth: Payer: Self-pay | Admitting: Hematology

## 2019-09-01 ENCOUNTER — Inpatient Hospital Stay: Payer: 59

## 2019-09-01 ENCOUNTER — Inpatient Hospital Stay: Payer: 59 | Attending: Hematology | Admitting: Hematology

## 2019-09-01 VITALS — BP 146/69 | HR 61 | Temp 97.8°F | Resp 18 | Ht 68.0 in | Wt 144.0 lb

## 2019-09-01 DIAGNOSIS — Z7982 Long term (current) use of aspirin: Secondary | ICD-10-CM | POA: Insufficient documentation

## 2019-09-01 DIAGNOSIS — Z79899 Other long term (current) drug therapy: Secondary | ICD-10-CM | POA: Diagnosis not present

## 2019-09-01 DIAGNOSIS — M3500 Sicca syndrome, unspecified: Secondary | ICD-10-CM | POA: Diagnosis not present

## 2019-09-01 DIAGNOSIS — D72810 Lymphocytopenia: Secondary | ICD-10-CM | POA: Diagnosis present

## 2019-09-01 DIAGNOSIS — Z833 Family history of diabetes mellitus: Secondary | ICD-10-CM

## 2019-09-01 DIAGNOSIS — M8580 Other specified disorders of bone density and structure, unspecified site: Secondary | ICD-10-CM | POA: Diagnosis not present

## 2019-09-01 DIAGNOSIS — E063 Autoimmune thyroiditis: Secondary | ICD-10-CM | POA: Diagnosis not present

## 2019-09-01 LAB — CBC WITH DIFFERENTIAL/PLATELET
Abs Immature Granulocytes: 0.01 10*3/uL (ref 0.00–0.07)
Basophils Absolute: 0 10*3/uL (ref 0.0–0.1)
Basophils Relative: 1 %
Eosinophils Absolute: 0 10*3/uL (ref 0.0–0.5)
Eosinophils Relative: 1 %
HCT: 40.9 % (ref 36.0–46.0)
Hemoglobin: 13.7 g/dL (ref 12.0–15.0)
Immature Granulocytes: 0 %
Lymphocytes Relative: 15 %
Lymphs Abs: 0.6 10*3/uL — ABNORMAL LOW (ref 0.7–4.0)
MCH: 28.7 pg (ref 26.0–34.0)
MCHC: 33.5 g/dL (ref 30.0–36.0)
MCV: 85.7 fL (ref 80.0–100.0)
Monocytes Absolute: 0.3 10*3/uL (ref 0.1–1.0)
Monocytes Relative: 8 %
Neutro Abs: 2.9 10*3/uL (ref 1.7–7.7)
Neutrophils Relative %: 75 %
Platelets: 247 10*3/uL (ref 150–400)
RBC: 4.77 MIL/uL (ref 3.87–5.11)
RDW: 12.9 % (ref 11.5–15.5)
WBC: 3.9 10*3/uL — ABNORMAL LOW (ref 4.0–10.5)
nRBC: 0 % (ref 0.0–0.2)

## 2019-09-01 LAB — CMP (CANCER CENTER ONLY)
ALT: 8 U/L (ref 0–44)
AST: 45 U/L — ABNORMAL HIGH (ref 15–41)
Albumin: 4.3 g/dL (ref 3.5–5.0)
Alkaline Phosphatase: 81 U/L (ref 38–126)
Anion gap: 9 (ref 5–15)
BUN: 13 mg/dL (ref 8–23)
CO2: 23 mmol/L (ref 22–32)
Calcium: 10.3 mg/dL (ref 8.9–10.3)
Chloride: 105 mmol/L (ref 98–111)
Creatinine: 0.89 mg/dL (ref 0.44–1.00)
GFR, Est AFR Am: >60 mL/min (ref >60)
GFR, Estimated: >60 mL/min (ref >60)
Glucose, Bld: 96 mg/dL (ref 70–99)
Potassium: 4.1 mmol/L (ref 3.5–5.1)
Sodium: 137 mmol/L (ref 135–145)
Total Bilirubin: 0.8 mg/dL (ref 0.3–1.2)
Total Protein: 9.1 g/dL — ABNORMAL HIGH (ref 6.5–8.1)

## 2019-09-01 LAB — SEDIMENTATION RATE: Sed Rate: 50 mm/hr — ABNORMAL HIGH (ref 0–22)

## 2019-09-01 LAB — VITAMIN B12: Vitamin B-12: 703 pg/mL (ref 180–914)

## 2019-09-01 LAB — HEPATITIS C ANTIBODY: HCV Ab: NONREACTIVE

## 2019-09-01 LAB — HIV ANTIBODY (ROUTINE TESTING W REFLEX): HIV Screen 4th Generation wRfx: NONREACTIVE

## 2019-09-01 MED FILL — azaTHIOprine 50 MG TABS: 50 | 30 days supply | Qty: 38 | Fill #1

## 2019-09-01 MED FILL — SYNTHROID 100 MCG TABLET: 100 | 30 days supply | Qty: 30 | Fill #10

## 2019-09-01 NOTE — Progress Notes (Signed)
HEMATOLOGY/ONCOLOGY CONSULTATION NOTE  Date of Service: 09/01/2019  Patient Care Team: Buelah Manis, Modena Nunnery, MD as PCP - General (Family Medicine)  CHIEF COMPLAINTS/PURPOSE OF CONSULTATION:  Lymphopenia  HISTORY OF PRESENTING ILLNESS:   Jennifer Barr is a wonderful 64 y.o. female who has been referred to [place] by Dr. 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 Pt is accompanied today by her husband. The pt reports that she is doing well overall.   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 She has also experienced dry eyes, dry mouth, and achy muscles. Pt suffered from painful joints after her inital diagnosis, but this was improved after she began medication for her Sjogrens. She has been on Imuran since her inital diagnosis and was on Plaqenil for the last 7-8 years. She is currently alternating between 50 mg and 75 mg of Imuram daily, but was taken off of Plaqenil 2-3 months ago due to the drop in her blood counts. She has not noticed any new symptoms after discontinuing Plaqenil. The last time that she had a flare was 1.5 years ago. She attributes her lack of flares to getting more sleep after retiring. She has a history of Hashimoto's, which is stable, and is on 100 mcg Synthroid.   Pt has had numbness in her feet for about a year, worse in the right. Pt denies any history of Vitamin B12 deficiency. She takes a B-complex, Vitamin C, Magnesium, and 1000 IU Vitamin D daily. She has received the COVID19 vaccines and tolerated them well.   Pt believes that she could have had a respiratory infection around the time that the concerning labs were drawn as she had a cough that lasted for months.   Most recent lab results (08/11/2019) of CBC w/diff and CMP is as follows: all values are WNL except for Lymphs Abs at 377, Eos Abs at 9, Globulin at 3.9, AST at 52. 08/11/2019 Sed Rate at 36  On review of  systems, pt reports numbness in feet and denies fevers, chills, night sweats, unexpected weight loss, SOB, abdominal pain, new lumps/bumps, spine pain, constipation and any other symptoms.   On PMHx the pt reports Sjogren's syndrome, Hashimoto's disease. On Social Hx the pt reports that she is a non-smoker and is retired.   MEDICAL HISTORY:  Past Medical History:  Diagnosis Date  . Osteopenia 07/2017   T score -1.3 FRAX 7.2% / 0.6%  . Premature ovarian failure   . Sjoegren syndrome    Dr. Estanislado Pandy  . Thyroid disease    Hashiomto  . Vasculitis ([place])    Dr. Estanislado Pandy    SURGICAL HISTORY: No past surgical history on file.  SOCIAL HISTORY: Social History   Socioeconomic History  . Marital status: Married    Spouse name: Not on file  . Number of children: Not on file  . Years of education: Not on file  . Highest education level: Not on file  Occupational History  . Not on file  Tobacco Use  . Smoking status: Never Smoker  . Smokeless tobacco: Never Used  Vaping Use  . Vaping Use: Never used  Substance and Sexual Activity  . Alcohol use: Not Currently  . Drug use: Never  . Sexual activity: Yes    Partners: Male    Comment: 1st itnercourse-18, partners- 56, married- 27 yrs   Other Topics Concern  . Not on file  Social History Narrative  .  Not on file   Social Determinants of Health   Financial Resource Strain:   . Difficulty of Paying Living Expenses: Not on file  Food Insecurity:   . Worried About Charity fundraiser in the Last Year: Not on file  . Ran Out of Food in the Last Year: Not on file  Transportation Needs:   . Lack of Transportation (Medical): Not on file  . Lack of Transportation (Non-Medical): Not on file  Physical Activity:   . Days of Exercise per Week: Not on file  . Minutes of Exercise per Session: Not on file  Stress:   . Feeling of Stress : Not on file  Social Connections:   . Frequency of Communication with Friends and Family: Not on file   . Frequency of Social Gatherings with Friends and Family: Not on file  . Attends Religious Services: Not on file  . Active Member of Clubs or Organizations: Not on file  . Attends Archivist Meetings: Not on file  . Marital Status: Not on file  Intimate Partner Violence:   . Fear of Current or Ex-Partner: Not on file  . Emotionally Abused: Not on file  . Physically Abused: Not on file  . Sexually Abused: Not on file    FAMILY HISTORY: Family History  Problem Relation Age of Onset  . Diabetes Mother   . Diabetes Father   . Scleroderma Daughter     ALLERGIES:  is allergic to sulfa antibiotics.  MEDICATIONS:  Current Outpatient Medications  Medication Sig Dispense Refill  . aspirin 81 MG tablet Take 81 mg by mouth daily.    Marland Kitchen azaTHIOprine (IMURAN) 50 MG tablet Take 50 mg by mouth alternating with 75 mg every other day. 113 tablet 0  . levothyroxine (SYNTHROID) 100 MCG tablet Take 1 tablet (100 mcg total) by mouth daily before breakfast. 30 tablet 12  . pilocarpine (SALAGEN) 5 MG tablet TAKE 1 TABLET BY MOUTH 3 TIMES DAILY 270 tablet 1   No current facility-administered medications for this visit.    REVIEW OF SYSTEMS:    10 Point review of Systems was done is negative except as noted above.  PHYSICAL EXAMINATION: ECOG PERFORMANCE STATUS: 0 - Asymptomatic  . Vitals:   09/01/19 1057  BP: (!) 146/69  Pulse: 61  Resp: 18  Temp: 97.8 F (36.6 C)  SpO2: 100%   Filed Weights   09/01/19 1057  Weight: 144 lb (65.3 kg)   .Body mass index is 21.9 kg/m.   GENERAL:alert, in no acute distress and comfortable SKIN: no acute rashes, no significant lesions EYES: conjunctiva are pink and non-injected, sclera anicteric OROPHARYNX: MMM, no exudates, no oropharyngeal erythema or ulceration NECK: supple, no JVD LYMPH:  no palpable lymphadenopathy in the cervical, axillary or inguinal regions LUNGS: clear to auscultation b/l with normal respiratory effort HEART:  regular rate & rhythm ABDOMEN:  normoactive bowel sounds , non tender, not distended. Extremity: no pedal edema PSYCH: alert & oriented x 3 with fluent speech NEURO: no focal motor/sensory deficits  LABORATORY DATA:  I have reviewed the data as listed  . CBC Latest Ref Rng & Units 09/01/2019 09/01/2019 08/11/2019  WBC 4.0 - 10.5 K/uL 3.9(L) - 4.6  Hemoglobin 12.0 - 15.0 g/dL 13.7 - 13.1  Hematocrit 34.0 - 46.6 % 42.6 40.9 40.6  Platelets 150 - 400 K/uL 247 - 242   . CBC    Component Value Date/Time   WBC 3.9 (L) 09/01/2019 1245   RBC 4.77  09/01/2019 1245   HGB 13.7 09/01/2019 1245   HCT 42.6 09/01/2019 1246   HCT 40.9 09/01/2019 1245   PLT 247 09/01/2019 1245   MCV 85.7 09/01/2019 1245   MCH 28.7 09/01/2019 1245   MCHC 33.5 09/01/2019 1245   RDW 12.9 09/01/2019 1245   LYMPHSABS 0.6 (L) 09/01/2019 1245   MONOABS 0.3 09/01/2019 1245   EOSABS 0.0 09/01/2019 1245   BASOSABS 0.0 09/01/2019 1245     . CMP Latest Ref Rng & Units 09/01/2019 08/11/2019 07/08/2019  Glucose 70 - 99 mg/dL 96 85 96  BUN 8 - 23 mg/dL _0 Creatinine 0.44 - 1.00 mg/dL 0.89 0.96 0.80  Sodium 135 - 145 mmol/L 137 135 136  Potassium 3.5 - 5.1 mmol/L 4.1 4.4 4.4  Chloride 98 - 111 mmol/L 105 104 104  CO2 22 - 32 mmol/L _1 Calcium 8.9 - 10.3 mg/dL 10.3 9.3 9.1  Total Protein 6.5 - 8.1 g/dL 9.1(H) 8.0 7.7  Total Bilirubin 0.3 - 1.2 mg/dL 0.8 1.0 0.5  Alkaline Phos 38 - 126 U/L 81 - -  AST 15 - 41 U/L 45(H) 52(H) 36(H)  ALT 0 - 44 U/L _2 RADIOGRAPHIC STUDIES: I have personally reviewed the radiological images as listed and agreed with the findings in the report. No results found.  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 PLAN: -Discussed patient's most recent labs from 08/11/2019, all values are WNL except for Lymphs Abs at 377, Eos Abs at 9, Globulin at 3.9, AST at  52, Sed Rate at 36. -Advised pt that the most likely causes of her lymphopenia are her Sjogren's or immunosuppressive medications.  -Advised pt that chronic immune suppression can increase the risk of lymphomas.  -Advised pt that the immunomodulation caused by Sjogren's is also an independant risk factor for lymphomas.  -Advised pt that keeping her immunosuppressive medications at the lowest possible dose that would also control her Sjogren's would be the goal. Recommend pt discuss this with Dr. Estanislado Pandy.  -Advised pt that Sjogrens's can also cause mild lymphadenopathy. -Advised pt that it may take time for her blood counts to improve after medication changes due to the chronic use of Imuran and Plaquenil.  -Advised pt that there will be no indication for treatment as long as Lymphocytes stay around 500.  -Recommend pt stay up-to-date with appropriate vaccinations and age-appropriate cancer screenings.  -Discussed the CDC guidelines regarding the COVID19 vaccine booster.  -Recommend pt receive Shingrix vaccine.  -Will get labs today. No indication for a more extensive w/o at this time.  -Will see back in 2 weeks via phone   FOLLOW UP: Labs today Phone visit with Dr Irene Limbo in 2 weeks (may move appointment ahead up to +1 week if needed)  . Orders Placed This Encounter  Procedures  . CBC with Differential/Platelet    Standing Status:   Future    Number of Occurrences:   1    Standing Expiration Date:   08/31/2020  . CD4 count    Standing Status:   Future    Number of Occurrences:   1    Standing Expiration Date:   08/31/2020  . CMP ([place] only)    Standing Status:   Future    Number of Occurrences:   1    Standing Expiration Date:   08/31/2020  .  Vitamin B12    Standing Status:   Future    Number of Occurrences:   1    Standing Expiration Date:   08/31/2020  . Folate RBC    Standing Status:   Future    Number of Occurrences:   1    Standing Expiration Date:   08/31/2020  .  Multiple Myeloma Panel (SPEP&IFE w/QIG)    Standing Status:   Future    Number of Occurrences:   1    Standing Expiration Date:   08/31/2020  . Zinc    Standing Status:   Future    Number of Occurrences:   1    Standing Expiration Date:   08/31/2020  . Hepatitis C antibody    Standing Status:   Future    Number of Occurrences:   1    Standing Expiration Date:   08/31/2020  . HIV Antibody (routine testing w rflx)    Standing Status:   Future    Number of Occurrences:   1    Standing Expiration Date:   08/31/2020  . Sedimentation rate    Standing Status:   Future    Number of Occurrences:   1    Standing Expiration Date:   08/31/2020     All of the patients questions were answered with apparent satisfaction. The patient knows to call the clinic with any problems, questions or concerns.  I spent 35 mins counseling the patient face to face. The total time spent in the appointment was 50 minutes and more than 50% was on counseling and direct patient cares.    Sullivan Lone MD [place] AAHIVMS [facility] Hematology/Oncology Physician [facility]  (Office):       [phone] (Work cell):  [phone] (Fax):           [phone]  09/01/2019 5:11 PM   I, Yevette Edwards, am acting as a scribe for Dr. Sullivan Lone.   .I have reviewed the above documentation for accuracy and completeness, and I agree with the above. Brunetta Genera MD

## 2019-09-01 NOTE — Telephone Encounter (Signed)
Scheduled per los. Gave avs and calendar  

## 2019-09-02 LAB — T-HELPER CELLS (CD4) COUNT (NOT AT ARMC)
CD4 % Helper T Cell: 68 % — ABNORMAL HIGH (ref 33–65)
CD4 T Cell Abs: 351 /uL — ABNORMAL LOW (ref 400–1790)

## 2019-09-02 LAB — FOLATE RBC
Folate, Hemolysate: 382 ng/mL
Folate, RBC: 897 ng/mL (ref >498)
Hematocrit: 42.6 % (ref 34.0–46.6)

## 2019-09-03 LAB — MULTIPLE MYELOMA PANEL, SERUM
Albumin SerPl Elph-Mcnc: 4 g/dL (ref 2.9–4.4)
Albumin/Glob SerPl: 1 (ref 0.7–1.7)
Alpha 1: 0.3 g/dL (ref 0.0–0.4)
Alpha2 Glob SerPl Elph-Mcnc: 0.9 g/dL (ref 0.4–1.0)
B-Globulin SerPl Elph-Mcnc: 1.2 g/dL (ref 0.7–1.3)
Gamma Glob SerPl Elph-Mcnc: 2.1 g/dL — ABNORMAL HIGH (ref 0.4–1.8)
Globulin, Total: 4.4 g/dL — ABNORMAL HIGH (ref 2.2–3.9)
IgA: 470 mg/dL — ABNORMAL HIGH (ref 87–352)
IgG (Immunoglobin G), Serum: 2234 mg/dL — ABNORMAL HIGH (ref 586–1602)
IgM (Immunoglobulin M), Srm: 39 mg/dL (ref 26–217)
Total Protein ELP: 8.4 g/dL (ref 6.0–8.5)

## 2019-09-04 LAB — ZINC: Zinc: 70 ug/dL (ref 44–115)

## 2019-09-04 NOTE — Progress Notes (Signed)
Office Visit Note  Patient: Jennifer Barr             Date of Birth: 16-Apr-1955           MRN: [id number]             PCP: Alycia Rossetti, MD Referring: Alycia Rossetti, MD Visit Date: 09/16/2019 Occupation: @GUAROCC @  Subjective:  Medication Management   History of Present Illness: Jennifer Barr is a 64 y.o. female with history of Sjogren's and vasculitis.  She continues to have sicca symptoms.  We stopped Plaquenil in June due to lymphopenia.  She states that she had no recurrence of vasculitis since June 2020.  She continues to do well.  She was also evaluated by hematology for lymphopenia.  He advised monitoring lab work for right now.  She denies any joint pain or joint swelling.  There is no history of shortness of breath.  Activities of Daily Living:  Patient reports morning stiffness for 0 minutes.   Patient Denies nocturnal pain.  Difficulty dressing/grooming: Denies Difficulty climbing stairs: Denies Difficulty getting out of chair: Denies Difficulty using hands for taps, buttons, cutlery, and/or writing: Denies  Review of Systems  Constitutional: Positive for fatigue.  HENT: Positive for mouth dryness and nose dryness. Negative for mouth sores.   Eyes: Positive for dryness.  Respiratory: Negative for shortness of breath and difficulty breathing.   Cardiovascular: Negative for chest pain and palpitations.  Gastrointestinal: Negative for blood in stool, constipation and diarrhea.  Endocrine: Negative for increased urination.  Genitourinary: Negative for difficulty urinating.  Musculoskeletal: Negative for arthralgias, joint pain, joint swelling, myalgias, morning stiffness, muscle tenderness and myalgias.  Skin: Negative for color change, rash and redness.  Allergic/Immunologic: Negative for susceptible to infections.  Neurological: Negative for dizziness, numbness, headaches, memory loss and weakness.  Hematological: Negative for bruising/bleeding tendency.    Psychiatric/Behavioral: Negative for confusion.    PMFS History:  Patient Active Problem List   Diagnosis Date Noted  . ANA positive 02/10/2016  . Rheumatoid factor positive 02/10/2016  . Primary osteoarthritis, right hand 02/10/2016  . Scoliosis 02/10/2016  . Raynaud's syndrome without gangrene 02/10/2016  . Sjogren's disease ([place]) 05/07/2014  . Vasculitis ([place]) 05/07/2014  . Hashimoto's thyroiditis 05/07/2014  . Acute sinusitis 01/07/2013  . Bronchitis 01/07/2013  . Conjunctivitis 01/07/2013  . Nevus of skin of lip 01/07/2013  . Laryngitis 01/07/2013    Past Medical History:  Diagnosis Date  . Osteopenia 07/2017   T score -1.3 FRAX 7.2% / 0.6%  . Premature ovarian failure   . Sjoegren syndrome    Dr. Estanislado Pandy  . Thyroid disease    Hashiomto  . Vasculitis ([place])    Dr. Estanislado Pandy    Family History  Problem Relation Age of Onset  . Diabetes Mother   . Diabetes Father   . Scleroderma Daughter    History reviewed. No pertinent surgical history. Social History   Social History Narrative  . Not on file   Immunization History  Administered Date(s) Administered  . Influenza,inj,Quad PF,6+ Mos 10/14/2018  . Moderna SARS-COVID-2 Vaccination 03/25/2019, 04/22/2019, 09/01/2019  . Tdap 06/06/2018     Objective: Vital Signs: BP 131/73 (BP Location: Left Arm, Patient Position: Sitting, Cuff Size: Normal)   Pulse 66   Resp 14   Ht 5' 8.5" (1.74 m)   Wt 142 lb 9.6 oz (64.7 kg)   BMI 21.37 kg/m    Physical Exam Vitals and nursing note reviewed.  Constitutional:  Appearance: She is well-developed.  HENT:     Head: Normocephalic and atraumatic.  Eyes:     Conjunctiva/sclera: Conjunctivae normal.  Cardiovascular:     Rate and Rhythm: Normal rate and regular rhythm.     Heart sounds: Normal heart sounds.  Pulmonary:     Effort: Pulmonary effort is normal.     Breath sounds: Normal breath sounds.  Abdominal:     General: Bowel sounds are normal.      Palpations: Abdomen is soft.  Musculoskeletal:     Cervical back: Normal range of motion.  Lymphadenopathy:     Cervical: No cervical adenopathy.  Skin:    General: Skin is warm and dry.     Capillary Refill: Capillary refill takes less than 2 seconds.  Neurological:     Mental Status: She is alert and oriented to person, place, and time.  Psychiatric:        Behavior: Behavior normal.      Musculoskeletal Exam: C-spine was in good range of motion.  Shoulder joints, elbow joints, wrist joints, MCPs PIPs and DIPs with good range of motion.  Hip joints, knee joints, ankles, MTPs and PIPs with good range of motion with no synovitis.  CDAI Exam: CDAI Score: -- Patient Global: --; Provider Global: -- Swollen: --; Tender: -- Joint Exam 09/16/2019   No joint exam has been documented for this visit   There is currently no information documented on the homunculus. Go to the Rheumatology activity and complete the homunculus joint exam.  Investigation: No additional findings.  Imaging: No results found.  Recent Labs: Lab Results  Component Value Date   WBC 3.9 (L) 09/01/2019   HGB 13.7 09/01/2019   PLT 247 09/01/2019   NA 137 09/01/2019   K 4.1 09/01/2019   CL 105 09/01/2019   CO2 23 09/01/2019   GLUCOSE 96 09/01/2019   BUN 13 09/01/2019   CREATININE 0.89 09/01/2019   BILITOT 0.8 09/01/2019   ALKPHOS 81 09/01/2019   AST 45 (H) 09/01/2019   ALT 8 09/01/2019   PROT 9.1 (H) 09/01/2019   ALBUMIN 4.3 09/01/2019   CALCIUM 10.3 09/01/2019   GFRAA >60 09/01/2019    Speciality Comments: PLQ Eye Exam: 04/22/2019 WNL @ Marica Otter, OD PA  Procedures:  No procedures performed Allergies: Sulfa antibiotics   Assessment / Plan:     Visit Diagnoses: Sjogren's syndrome with other organ involvement ([place]) -she continues to have sicca symptoms but they are more manageable since she has been retired.  She has been on Imuran and pilocarpine for management of her symptoms.  High risk  medication use - alternating Imuran 50 mg and 75 mg every other day.PLQ was discontinued and June due to lymphopenia.Marland Kitchen PLQ eye exam: 04/18/18  Vasculitis ([place]) - No recurrence since July 2020.  Her sed rate was recently elevated.  We will recheck her sed rate with the next labs.  Raynaud's syndrome without gangrene-currently not symptomatic.  Lymphopenia - Referred to Dr.Kale.  She had thorough work-up which was negative.  Her lymphocyte count came up.  I reviewed her labs and records from Dr. Grier Mitts visit.  He felt that lymphopenia was related to autoimmune disease and medications.  He also suspected that the acute lymphopenia could be due to a viral infection.  Primary osteoarthritis of both hands-she is currently not having any discomfort.  Scoliosis  History of hypothyroidism  Orders: Orders Placed This Encounter  Procedures  . Sedimentation rate   No orders of the  defined types were placed in this encounter.    Follow-Up Instructions: Return in about 5 months (around 02/16/2020) for Sjogren's.   Bo Merino, MD  Note - This record has been created using Editor, commissioning.  Chart creation errors have been sought, but may not always  have been located. Such creation errors do not reflect on  the standard of medical care.

## 2019-09-09 ENCOUNTER — Inpatient Hospital Stay (HOSPITAL_BASED_OUTPATIENT_CLINIC_OR_DEPARTMENT_OTHER): Payer: 59 | Admitting: Hematology

## 2019-09-09 DIAGNOSIS — D72810 Lymphocytopenia: Secondary | ICD-10-CM

## 2019-09-09 NOTE — Progress Notes (Signed)
HEMATOLOGY/ONCOLOGY CONSULTATION NOTE  Date of Service: 09/09/2019  Patient Care Team: [facility], Modena Nunnery, MD as PCP - General (Family Medicine)  CHIEF COMPLAINTS/PURPOSE OF CONSULTATION:  Lymphopenia  HISTORY OF PRESENTING ILLNESS:   Jennifer Barr is a wonderful 64 y.o. female who has been referred to [place] by Dr. 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 Pt is accompanied today by her husband. The pt reports that she is doing well overall.   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 She has also experienced dry eyes, dry mouth, and achy muscles. Pt suffered from painful joints after her inital diagnosis, but this was improved after she began medication for her Sjogrens. She has been on Imuran since her inital diagnosis and was on Plaqenil for the last 7-8 years. She is currently alternating between 50 mg and 75 mg of Imuram daily, but was taken off of Plaqenil 2-3 months ago due to the drop in her blood counts. She has not noticed any new symptoms after discontinuing Plaqenil. The last time that she had a flare was 1.5 years ago. She attributes her lack of flares to getting more sleep after retiring. She has a history of Hashimoto's, which is stable, and is on 100 mcg Synthroid.   Pt has had numbness in her feet for about a year, worse in the right. Pt denies any history of Vitamin B12 deficiency. She takes a B-complex, Vitamin C, Magnesium, and 1000 IU Vitamin D daily. She has received the COVID19 vaccines and tolerated them well.   Pt believes that she could have had a respiratory infection around the time that the concerning labs were drawn as she had a cough that lasted for months.   Most recent lab results (08/11/2019) of CBC w/diff and CMP is as follows: all values are WNL except for Lymphs Abs at 377, Eos Abs at 9, Globulin at 3.9, AST at 52. 08/11/2019 Sed Rate at 36  On review of  systems, pt reports numbness in feet and denies fevers, chills, night sweats, unexpected weight loss, SOB, abdominal pain, new lumps/bumps, spine pain, constipation and any other symptoms.   On PMHx the pt reports Sjogren's syndrome, Hashimoto's disease. On Social Hx the pt reports that she is a non-smoker and is retired.   INTERVAL HISTORY: I connected with  Jennifer Barr on 09/09/19 by telephone and verified that I am speaking with the correct person using two identifiers.   I discussed the limitations of evaluation and management by telemedicine. The patient expressed understanding and agreed to proceed.  Other persons participating in the visit and their role in the encounter:       -Yevette Edwards, Medical Scribe      -Pt's husband  Patient's location: Home Provider's location: [place] at [employer] is a wonderful 64 y.o. female who is here for evaluation and management of lymphopenia. The patient's last visit with [place] was on 09/01/2019. The pt reports that she is doing well overall.  The pt reports that she feels well and has no new concerns or symptoms.   Lab results (09/01/19) of CBC w/diff and CMP is as follows: all values are WNL except for WBC at 3.9K, Lymphs Abs at 0.6K, Total Protein at 9.1, AST at 45. 09/01/2019 MMP is as follows: all values are WNL except for IgG at 2234, IgA at 470, Gamma Glob at 2.1, Total Glob at 4.4  09/01/2019 Folate RBC is as follows: Folate Hemolysate at 3820, HCT at 42.6, Folate RBC at 897 09/01/2019 CD4 T Cell Abs at 351, CD4 % Helper T Cell at 68 09/01/2019 HIV Screen is "Non Reactive" 09/01/2019 HCV Ab is "Non Reactive" 09/01/2019 Vitamin B12 at 703 09/01/2019 Sed Rate at 50 09/01/2019 Zinc at 70  On review of systems, pt denies fevers, chills, night sweats and any other symptoms.   MEDICAL HISTORY:  Past Medical History:  Diagnosis Date  . Osteopenia 07/2017   T score -1.3 FRAX 7.2% / 0.6%  . Premature ovarian failure   .  Sjoegren syndrome    Dr. Estanislado Pandy  . Thyroid disease    Hashiomto  . Vasculitis ([place])    Dr. Estanislado Pandy    SURGICAL HISTORY: No past surgical history on file.  SOCIAL HISTORY: Social History   Socioeconomic History  . Marital status: Married    Spouse name: Not on file  . Number of children: Not on file  . Years of education: Not on file  . Highest education level: Not on file  Occupational History  . Not on file  Tobacco Use  . Smoking status: Never Smoker  . Smokeless tobacco: Never Used  Vaping Use  . Vaping Use: Never used  Substance and Sexual Activity  . Alcohol use: Not Currently  . Drug use: Never  . Sexual activity: Yes    Partners: Male    Comment: 1st itnercourse-18, partners- 40, married- 88 yrs   Other Topics Concern  . Not on file  Social History Narrative  . Not on file   Social Determinants of Health   Financial Resource Strain:   . Difficulty of Paying Living Expenses: Not on file  Food Insecurity:   . Worried About Charity fundraiser in the Last Year: Not on file  . Ran Out of Food in the Last Year: Not on file  Transportation Needs:   . Lack of Transportation (Medical): Not on file  . Lack of Transportation (Non-Medical): Not on file  Physical Activity:   . Days of Exercise per Week: Not on file  . Minutes of Exercise per Session: Not on file  Stress:   . Feeling of Stress : Not on file  Social Connections:   . Frequency of Communication with Friends and Family: Not on file  . Frequency of Social Gatherings with Friends and Family: Not on file  . Attends Religious Services: Not on file  . Active Member of Clubs or Organizations: Not on file  . Attends Archivist Meetings: Not on file  . Marital Status: Not on file  Intimate Partner Violence:   . Fear of Current or Ex-Partner: Not on file  . Emotionally Abused: Not on file  . Physically Abused: Not on file  . Sexually Abused: Not on file    FAMILY HISTORY: Family History    Problem Relation Age of Onset  . Diabetes Mother   . Diabetes Father   . Scleroderma Daughter     ALLERGIES:  is allergic to sulfa antibiotics.  MEDICATIONS:  Current Outpatient Medications  Medication Sig Dispense Refill  . aspirin 81 MG tablet Take 81 mg by mouth daily.    Marland Kitchen azaTHIOprine (IMURAN) 50 MG tablet Take 50 mg by mouth alternating with 75 mg every other day. 113 tablet 0  . levothyroxine (SYNTHROID) 100 MCG tablet Take 1 tablet (100 mcg total) by mouth daily before breakfast. 30 tablet 12  . pilocarpine (SALAGEN) 5  MG tablet TAKE 1 TABLET BY MOUTH 3 TIMES DAILY 270 tablet 1   No current facility-administered medications for this visit.    REVIEW OF SYSTEMS:   A 10+ POINT REVIEW OF SYSTEMS WAS OBTAINED including neurology, dermatology, psychiatry, cardiac, respiratory, lymph, extremities, GI, GU, Musculoskeletal, constitutional, breasts, reproductive, HEENT.  All pertinent positives are noted in the HPI.  All others are negative.   PHYSICAL EXAMINATION: ECOG PERFORMANCE STATUS: 0 - Asymptomatic  . There were no vitals filed for this visit. There were no vitals filed for this visit. .There is no height or weight on file to calculate BMI.  Telehealth visit  LABORATORY DATA:  I have reviewed the data as listed  . CBC Latest Ref Rng & Units 09/01/2019 09/01/2019 08/11/2019  WBC 4.0 - 10.5 K/uL 3.9(L) - 4.6  Hemoglobin 12.0 - 15.0 g/dL 13.7 - 13.1  Hematocrit 34.0 - 46.6 % 42.6 40.9 40.6  Platelets 150 - 400 K/uL 247 - 242   . CBC    Component Value Date/Time   WBC 3.9 (L) 09/01/2019 1245   RBC 4.77 09/01/2019 1245   HGB 13.7 09/01/2019 1245   HCT 42.6 09/01/2019 1246   HCT 40.9 09/01/2019 1245   PLT 247 09/01/2019 1245   MCV 85.7 09/01/2019 1245   MCH 28.7 09/01/2019 1245   MCHC 33.5 09/01/2019 1245   RDW 12.9 09/01/2019 1245   LYMPHSABS 0.6 (L) 09/01/2019 1245   MONOABS 0.3 09/01/2019 1245   EOSABS 0.0 09/01/2019 1245   BASOSABS 0.0 09/01/2019 1245      . CMP Latest Ref Rng & Units 09/01/2019 08/11/2019 07/08/2019  Glucose 70 - 99 mg/dL 96 85 96  BUN 8 - 23 mg/dL 13 16 14   Creatinine 0.44 - 1.00 mg/dL 0.89 0.96 0.80  Sodium 135 - 145 mmol/L 137 135 136  Potassium 3.5 - 5.1 mmol/L 4.1 4.4 4.4  Chloride 98 - 111 mmol/L 105 104 104  CO2 22 - 32 mmol/L 23 24 25   Calcium 8.9 - 10.3 mg/dL 10.3 9.3 9.1  Total Protein 6.5 - 8.1 g/dL 9.1(H) 8.0 7.7  Total Bilirubin 0.3 - 1.2 mg/dL 0.8 1.0 0.5  Alkaline Phos 38 - 126 U/L 81 - -  AST 15 - 41 U/L 45(H) 52(H) 36(H)  ALT 0 - 44 U/L 8 14 7      RADIOGRAPHIC STUDIES: I have personally reviewed the radiological images as listed and agreed with the findings in the report. No results found.  ASSESSMENT & PLAN:   64 yo with   1) Lymphopenia  Likely related to Autoimmune disease (Sjogrens) + Immunosuppressive medications R/o other etiologies.  PLAN: -Discussed pt labwork, 09/01/19; lymphocytes have improved, other blood counts are nml, blood chemistries are okay, MMP shows polyclonal increase in immunoglobulins, CD4 counts are okay; HIV& Hep C screening is negative, Folate is WNL, B12 & Zinc are good, Sed rate is mildly elevated.  -Advised pt that the elevation in lymphocytes is most likely from a reduction in her immunosuppressive medications.  -Advised pt that improving lymphocytes could also be a reflection of a passing viral infection.  -Advised pt that there will be an ongoing balancing act controlling her autoimmune condition vs lymphopenia.  -No concerns for lymphoma based on symptomology and improving lymphocytes with lowered immunosupression.  -Discussed again increased risk of lymphoma with chronic immune suppression and Sjogren's. -Advised pt that polyclonal increase in immunoglobulins and elevated Sed Rate are likely caused by her Sjogren's.  -Recommend pt f/u with Dr. Estanislado Pandy for  Sjogren's management  -Recommend pt f/u with PCP for periodic labwork -Will see back as  needed   FOLLOW UP: RTC with Dr Irene Limbo as needed   The total time spent in the appt was 20 minutes and more than 50% was on counseling and direct patient cares.  All of the patient's questions were answered with apparent satisfaction. The patient knows to call the clinic with any problems, questions or concerns.    Sullivan Lone MD [place] AAHIVMS [facility] Hematology/Oncology Physician [facility]  (Office):       [phone] (Work cell):  [phone] (Fax):           [phone]  09/09/2019 5:24 PM  I, Yevette Edwards, am acting as a scribe for Dr. Sullivan Lone.   .I have reviewed the above documentation for accuracy and completeness, and I agree with the above. Brunetta Genera MD

## 2019-09-16 ENCOUNTER — Ambulatory Visit (INDEPENDENT_AMBULATORY_CARE_PROVIDER_SITE_OTHER): Payer: PRIVATE HEALTH INSURANCE | Admitting: Rheumatology

## 2019-09-16 ENCOUNTER — Encounter: Payer: Self-pay | Admitting: Rheumatology

## 2019-09-16 ENCOUNTER — Other Ambulatory Visit: Payer: Self-pay

## 2019-09-16 VITALS — BP 131/73 | HR 66 | Resp 14 | Ht 68.5 in | Wt 142.6 lb

## 2019-09-16 DIAGNOSIS — M3509 Sicca syndrome with other organ involvement: Secondary | ICD-10-CM | POA: Diagnosis not present

## 2019-09-16 DIAGNOSIS — I776 Arteritis, unspecified: Secondary | ICD-10-CM

## 2019-09-16 DIAGNOSIS — Z8639 Personal history of other endocrine, nutritional and metabolic disease: Secondary | ICD-10-CM

## 2019-09-16 DIAGNOSIS — M419 Scoliosis, unspecified: Secondary | ICD-10-CM

## 2019-09-16 DIAGNOSIS — Z7189 Other specified counseling: Secondary | ICD-10-CM

## 2019-09-16 DIAGNOSIS — Z79899 Other long term (current) drug therapy: Secondary | ICD-10-CM | POA: Diagnosis not present

## 2019-09-16 DIAGNOSIS — M19041 Primary osteoarthritis, right hand: Secondary | ICD-10-CM

## 2019-09-16 DIAGNOSIS — D72810 Lymphocytopenia: Secondary | ICD-10-CM

## 2019-09-16 DIAGNOSIS — M19042 Primary osteoarthritis, left hand: Secondary | ICD-10-CM

## 2019-09-16 DIAGNOSIS — I7300 Raynaud's syndrome without gangrene: Secondary | ICD-10-CM | POA: Diagnosis not present

## 2019-09-16 NOTE — Patient Instructions (Addendum)
COVID-19 vaccine recommendations:   COVID-19 vaccine is recommended for everyone (unless you are allergic to a vaccine component), even if you are on a medication that suppresses your immune system.   If you are on Methotrexate, Cellcept (mycophenolate), Rinvoq, Morrie Sheldon, and Olumiant- hold the medication for 1 week after each vaccine. Hold Methotrexate for 2 weeks after the single dose COVID-19 vaccine.   If you are on Orencia subcutaneous injection - hold medication one week prior to and one week after the first COVID-19 vaccine dose (only).   If you are on Orencia IV infusions- time vaccination administration so that the first COVID-19 vaccination will occur four weeks after the infusion and postpone the subsequent infusion by one week.   If you are on Cyclophosphamide or Rituxan infusions please contact your doctor prior to receiving the COVID-19 vaccine.   Do not take Tylenol or any anti-inflammatory medications (NSAIDs) 24 hours prior to the COVID-19 vaccination.   There is no direct evidence about the efficacy of the COVID-19 vaccine in individuals who are on medications that suppress the immune system.   Even if you are fully vaccinated, and you are on any medications that suppress your immune system, please continue to wear a mask, maintain at least six feet social distance and practice hand hygiene.   If you develop a COVID-19 infection, please contact your PCP or our office to determine if you need antibody infusion.  The booster vaccine is now available for immunocompromised patients. It is advised that if you had Pfizer vaccine you should get Coca[employer]a booster.  If you had a Moderna vaccine then you should get a Moderna booster. Johnson and Wynetta Emery does not have a booster vaccine at this time.  Please see the following web sites for updated information.    https://www.[email or web address].org/Portals/0/Files/COVID-19-Vaccination-Patient-Resources.[email or web address]  [email or web address]://[email or web address]/About-Us/Newsroom/Press-Releases/ID/1159  Standing Labs We placed an order today for your standing lab work.   Please have your standing labs drawn in November and ever 3 months  If possible, please have your labs drawn 2 weeks prior to your appointment so that the provider can discuss your results at your appointment.  We have open lab daily Monday through Thursday from 8:30-12:30 PM and 1:30-4:30 PM and Friday from 8:30-12:30 PM and 1:30-4:00 PM at the office of Dr. Bo Merino, [facility].   Please be advised, patients with office appointments requiring lab work will take precedents over walk-in lab work.  If possible, please come for your lab work on Monday and Friday afternoons, as you may experience shorter wait times. The office is located at [address], [place], [place], [place] [address] No appointment is necessary.   Labs are drawn by [facility]. Please bring your co-pay at the time of your lab draw.  You may receive a bill from [facility] for your lab work.  If you wish to have your labs drawn at another location, please call the office 24 hours in advance to send orders.  If you have any questions regarding directions or hours of operation,  please call [phone].   As a reminder, please drink plenty of water prior to coming for your lab work. Thanks!

## 2019-10-02 MED FILL — azaTHIOprine 50 MG TABS: 50 | 29 days supply | Qty: 37 | Fill #2

## 2019-10-02 MED FILL — PILOCARPINE HCL 5 MG TABLET: 5 | 30 days supply | Qty: 90 | Fill #3

## 2019-10-02 MED FILL — SYNTHROID 100 MCG TABLET: 100 | 30 days supply | Qty: 30 | Fill #11

## 2019-10-28 ENCOUNTER — Telehealth: Payer: Self-pay | Admitting: *Deleted

## 2019-10-28 DIAGNOSIS — E063 Autoimmune thyroiditis: Secondary | ICD-10-CM

## 2019-10-28 NOTE — Telephone Encounter (Signed)
Patient called requesting refill on synthroid 100 mcg tablets, had annual exam on 06/17/19. No labs done at this visit. Last TSH level drawn in 2020 and it was normal. Patient said she feels fine on Rx. Okay to refill or do you want a more recent TSH level?

## 2019-10-29 ENCOUNTER — Other Ambulatory Visit: Payer: Self-pay

## 2019-10-29 ENCOUNTER — Other Ambulatory Visit: Payer: 59

## 2019-10-29 DIAGNOSIS — E063 Autoimmune thyroiditis: Secondary | ICD-10-CM

## 2019-10-29 NOTE — Telephone Encounter (Signed)
Left detailed message on cell to schedule lab appointment to check TSH level.

## 2019-10-29 NOTE — Telephone Encounter (Signed)
Needs to repeat TSH now and agree to represcribe same dosage if TSH normal.

## 2019-10-30 LAB — TSH: TSH: 0.01 mIU/L — ABNORMAL LOW (ref 0.40–4.50)

## 2019-10-30 MED ORDER — LEVOTHYROXINE SODIUM 75 MCG PO TABS: 75.0000 ug | ORAL_TABLET | Freq: Every day | ORAL | 0 refills | Status: DC

## 2019-10-30 NOTE — Telephone Encounter (Signed)
Please decrease Synthroid to 75 mcg PO daily and repeat TSH in 3 months.

## 2019-10-30 NOTE — Telephone Encounter (Signed)
Patient informed, Rx sent. Lab order placed.

## 2019-10-30 NOTE — Telephone Encounter (Signed)
Dr.Lavoie patient saw TSH results asked what dose of synthroid should she be on based off results?

## 2019-11-20 ENCOUNTER — Telehealth: Payer: Self-pay | Admitting: *Deleted

## 2019-11-20 MED ORDER — SYNTHROID 75 MCG PO TABS: 75.0000 ug | ORAL_TABLET | Freq: Every day | ORAL | 0 refills | Status: DC

## 2019-11-20 NOTE — Telephone Encounter (Signed)
Patient called c/o hives due to taking generic levothyroxine 75 mcg tablet. Report she has always used brand only in the past. Brand DAW Synthroid 75 mcg tablet prescribed. Patient aware.

## 2019-11-25 ENCOUNTER — Telehealth: Payer: Self-pay | Admitting: *Deleted

## 2019-11-25 NOTE — Telephone Encounter (Signed)
Agree with [employer] 75 mcg PO daily.

## 2019-11-25 NOTE — Telephone Encounter (Signed)
I called [facility] and informed to change manufacture to [employer] which will dispense the brand synthroid 75 mcg tablet. They will fill Rx for patient.

## 2019-11-25 NOTE — Telephone Encounter (Signed)
[facility] mail order pharmacy called stating the manufacture will change because of brand Synthroid 75 mcg tablet from [employer] which is generic levothyroxine to [employer] which is the manufacture for Brand Synthroid 75 mcg tablet. [facility] needs the okay from the prescribing physician to switch to [employer]. Okay to do switch?

## 2019-12-02 ENCOUNTER — Other Ambulatory Visit: Payer: Self-pay

## 2019-12-02 DIAGNOSIS — Z79899 Other long term (current) drug therapy: Secondary | ICD-10-CM

## 2019-12-02 DIAGNOSIS — M3509 Sicca syndrome with other organ involvement: Secondary | ICD-10-CM

## 2019-12-02 LAB — CBC WITH DIFFERENTIAL/PLATELET
Absolute Monocytes: 435 cells/uL (ref 200–950)
Basophils Absolute: 22 cells/uL (ref 0–200)
Basophils Relative: 0.4 %
Eosinophils Absolute: 33 cells/uL (ref 15–500)
Eosinophils Relative: 0.6 %
HCT: 41.4 % (ref 35.0–45.0)
Hemoglobin: 13.8 g/dL (ref 11.7–15.5)
Lymphs Abs: 616 cells/uL — ABNORMAL LOW (ref 850–3900)
MCH: 28.9 pg (ref 27.0–33.0)
MCHC: 33.3 g/dL (ref 32.0–36.0)
MCV: 86.6 fL (ref 80.0–100.0)
MPV: 9.5 fL (ref 7.5–12.5)
Monocytes Relative: 7.9 %
Neutro Abs: 4395 cells/uL (ref 1500–7800)
Neutrophils Relative %: 79.9 %
Platelets: 267 10*3/uL (ref 140–400)
RBC: 4.78 10*6/uL (ref 3.80–5.10)
RDW: 12.6 % (ref 11.0–15.0)
Total Lymphocyte: 11.2 %
WBC: 5.5 10*3/uL (ref 3.8–10.8)

## 2019-12-02 LAB — SEDIMENTATION RATE: Sed Rate: 38 mm/h — ABNORMAL HIGH (ref 0–30)

## 2019-12-03 ENCOUNTER — Other Ambulatory Visit: Payer: Self-pay | Admitting: *Deleted

## 2019-12-03 ENCOUNTER — Telehealth: Payer: Self-pay

## 2019-12-03 DIAGNOSIS — Z79899 Other long term (current) drug therapy: Secondary | ICD-10-CM

## 2019-12-03 NOTE — Telephone Encounter (Signed)
Standing lab orders placed

## 2019-12-04 LAB — COMPLETE METABOLIC PANEL WITH GFR
AG Ratio: 1.3 (calc) (ref 1.0–2.5)
ALT: 10 U/L (ref 6–29)
AST: 40 U/L — ABNORMAL HIGH (ref 10–35)
Albumin: 4.4 g/dL (ref 3.6–5.1)
Alkaline phosphatase (APISO): 78 U/L (ref 37–153)
BUN: 14 mg/dL (ref 7–25)
CO2: 25 mmol/L (ref 20–32)
Calcium: 9.3 mg/dL (ref 8.6–10.4)
Chloride: 101 mmol/L (ref 98–110)
Creat: 0.89 mg/dL (ref 0.50–0.99)
GFR, Est African American: 80 mL/min/{1.73_m2} (ref >=60)
GFR, Est Non African American: 69 mL/min/{1.73_m2} (ref >=60)
Globulin: 3.4 g/dL (calc) (ref 1.9–3.7)
Glucose, Bld: 83 mg/dL (ref 65–139)
Potassium: 4.3 mmol/L (ref 3.5–5.3)
Sodium: 134 mmol/L — ABNORMAL LOW (ref 135–146)
Total Bilirubin: 0.9 mg/dL (ref 0.2–1.2)
Total Protein: 7.8 g/dL (ref 6.1–8.1)

## 2019-12-07 NOTE — Progress Notes (Signed)
AST is borderline elevated. ALT WNL.  Please advise the patient to continue to avoid taking tylenol, NSAIDs, and alcohol use.  We will continue to monitor closely.

## 2019-12-08 NOTE — Progress Notes (Signed)
I reviewed with Dr. Estanislado Pandy.  [place] for the patient to continue on current dose of imuran.

## 2019-12-25 ENCOUNTER — Other Ambulatory Visit: Payer: Self-pay | Admitting: *Deleted

## 2019-12-25 MED ORDER — AZATHIOPRINE 50 MG PO TABS
ORAL_TABLET | ORAL | 0 refills | Status: DC
Start: 2019-12-25 — End: 2020-02-12

## 2019-12-25 NOTE — Telephone Encounter (Signed)
Last Visit: 09/16/2019 Next Visit: 03/16/2020 Labs: 12/03/2019 Sodium 134, AST 40, Lymphs 616  Current Dose per office note 09/16/2019: alternating Imuran 50 mg and 75 mg every other day. DX: Sjogren's syndrome with other organ involvement   Okay to refill Imuran?

## 2020-02-02 ENCOUNTER — Other Ambulatory Visit (INDEPENDENT_AMBULATORY_CARE_PROVIDER_SITE_OTHER): Payer: 59

## 2020-02-02 ENCOUNTER — Other Ambulatory Visit: Payer: Self-pay | Admitting: *Deleted

## 2020-02-02 ENCOUNTER — Other Ambulatory Visit: Payer: Self-pay

## 2020-02-02 DIAGNOSIS — E063 Autoimmune thyroiditis: Secondary | ICD-10-CM

## 2020-02-03 ENCOUNTER — Telehealth: Payer: Self-pay | Admitting: *Deleted

## 2020-02-03 LAB — TSH: TSH: 1.47 mIU/L (ref 0.40–4.50)

## 2020-02-03 MED ORDER — SYNTHROID 75 MCG PO TABS
75.0000 ug | ORAL_TABLET | Freq: Every day | ORAL | 1 refills | Status: DC
Start: 2020-02-03 — End: 2020-08-02

## 2020-02-03 MED ORDER — SYNTHROID 75 MCG PO TABS
75.0000 ug | ORAL_TABLET | Freq: Every day | ORAL | 1 refills | Status: DC
Start: 2020-02-03 — End: 2020-02-03

## 2020-02-03 NOTE — Telephone Encounter (Signed)
Patient called back stating her insurance has changed and she now needs 90 day supply sent to [facility]

## 2020-02-03 NOTE — Addendum Note (Signed)
Addended by: Thamas Jaegers on: 02/03/2020 01:38 PM   Modules accepted: Orders

## 2020-02-03 NOTE — Telephone Encounter (Signed)
Patient called requesting refill on synthroid 75 mcg tablet , had recent TSH done and normal. Rx sent.

## 2020-02-12 ENCOUNTER — Other Ambulatory Visit: Payer: Self-pay | Admitting: *Deleted

## 2020-02-12 ENCOUNTER — Other Ambulatory Visit: Payer: Self-pay | Admitting: Physician Assistant

## 2020-02-12 MED ORDER — AZATHIOPRINE 50 MG PO TABS
ORAL_TABLET | ORAL | 0 refills | Status: DC
Start: 2020-02-12 — End: 2020-08-02

## 2020-02-12 MED ORDER — PILOCARPINE HCL 5 MG PO TABS: 5.0000 mg | ORAL_TABLET | Freq: Three times a day (TID) | ORAL | 1 refills | Status: DC

## 2020-02-12 NOTE — Telephone Encounter (Signed)
Refill faxed from [facility]  Last Visit: 9/72021 Next Visit: 03/16/2020  Current Dose per office note on 09/16/2019, alternating Imuran 50 mg and 75 mg every other day. Dx: Sjogren's syndrome with other organ involvement   Okay to refill Imuran?

## 2020-02-12 NOTE — Telephone Encounter (Signed)
She is due to update lab work this month.  Please notify the patient.

## 2020-02-12 NOTE — Telephone Encounter (Signed)
Refill faxed from [facility] with patient regarding needing to update labs, patient due for labs 03/04/2020, RX for Imuran sent to [facility] too soon. I called [facility], RX for Imuran put on hold until 03/2020. Patient requesting RF on Piloocarpine.  Last Visit:09/16/2019 Next Visit: 03/16/2020  Current Dose per office note on 09/16/2019, not discussed Dx: Sjogren's syndrome with other organ involvement   Okay to refill Pilocarpine?

## 2020-02-12 NOTE — Telephone Encounter (Signed)
I called patient, patient requesting Pilocarpine, I called [facility] - RX for Imuran on hold until 03/2020, patient is due for labs 03/04/2020, patient will come in for labs when due.

## 2020-02-25 ENCOUNTER — Telehealth: Payer: Self-pay

## 2020-02-25 DIAGNOSIS — U071 COVID-19: Secondary | ICD-10-CM

## 2020-02-25 MED ORDER — PILOCARPINE HCL 5 MG PO TABS: 5.0000 mg | ORAL_TABLET | Freq: Three times a day (TID) | ORAL | 1 refills | Status: DC

## 2020-02-25 NOTE — Telephone Encounter (Signed)
Patient called stating she was diagnosed with COVID yesterday.  Patient requested a return call to answer the following questions:  1.  Should I continue taking medication?  2.  Can I get the antibody infusion this morning?  3.  Needs her prescription of Pilocarpine to be sent to her mail order pharmacy - [facility].

## 2020-02-25 NOTE — Telephone Encounter (Signed)
I called patient and offered prednisone 10 mg p.o. daily to prevent a flare of vasculitis but she declined.  She stated that she has not had a flare in 2 years.  She will let me know if she develops any rash.  She was advised to resume Imuran 2 weeks after she is asymptomatic.

## 2020-02-25 NOTE — Telephone Encounter (Signed)
For post Covid exposure:  Date of Symptom Onset: 02/23/2020 Medical Conditions: Sjogren's syndrome with other organ involvement, Vasculitis Raynaud's syndrome without gangrene, Lymphopenia  Is patient receiving IV therapy for chronic conditions? No Has patient had an organ transplant or bone marrow transplant in the last 6 month? No  Vaccination Status:Vaccinated  Name of Vaccine Received:Moderna Number of doses: 3   Patient advised to hold Imuran for 3 weeks after Covid symptoms have resolved.   Patient advised it may take up to 48 hours for the infusion center to contact them if she qualifies. Patient advised if symptoms worsen to go to the emergency room for evaluation.    Prescription for Pilocarpine resent to the mail order pharmacy as requested.   Patient asked about what she may be able to take for the headache she is experiencing as she has elevated LFTs and has been advised to avoid taking Tylenol and NSAIDs. Patient advised to contact her PCP. Patient states her PCP retired and she does not have an appointment with a new one until April. Patient advised to follow up with urgent care.

## 2020-02-26 ENCOUNTER — Ambulatory Visit (HOSPITAL_COMMUNITY): Payer: 59

## 2020-02-26 ENCOUNTER — Telehealth: Payer: Self-pay | Admitting: Family

## 2020-02-26 ENCOUNTER — Other Ambulatory Visit: Payer: Self-pay | Admitting: Family

## 2020-02-26 DIAGNOSIS — M3500 Sicca syndrome, unspecified: Secondary | ICD-10-CM

## 2020-02-26 DIAGNOSIS — U071 COVID-19: Secondary | ICD-10-CM

## 2020-02-26 NOTE — Telephone Encounter (Signed)
Called to discuss with patient about COVID-19 symptoms and the use of one of the available treatments for those with mild to moderate Covid symptoms and at a high risk of hospitalization.  Pt appears to qualify for outpatient treatment due to co-morbid conditions and/or a member of an at-risk group in accordance with the FDA Emergency Use Authorization.    Symptom onset: 02/23/20 Vaccinated: Yes Booster? Yes Immunocompromised? Yes Qualifiers: Immunocompromised  Tested positive on 2/15 through home test. Jennifer Barr has received her initial vaccines and a booster. We discussed the risks, benefits, and potential financial costs associated with treatment with monoclonal antibodies as well as Paxlovid. She wishes to proceed with Sotrovimab treatment at this time.  Hello Jennifer Barr,   We contacted you because you were recently diagnosed with COVID-19 and may benefit from a new treatment for mild to moderate disease. This treatment helps reduce the chance of being hospitalized. For some patients with medical conditions that may increase the chances of an infection, the treatment also decreases the risk for serious symptoms related to COVID-19.   The Food and Drug Administration (FDA) approved emergency use of a new drug to treat patients with mild to moderate symptoms who have risk factors that could cause severe symptoms related to COVID-19. This new treatment is a monoclonal antibody. It works by attaching like a magnet to the SARS-CoV2 virus (the virus that causes COVID-19) and stops it from infecting more cells in your body. It does not kill the virus, but it prevents it from spreading throughout your body with the hope that it will decrease your symptoms after it is administered.   This new drug is an intravenous (IV) infusion called Sotrovimab that is given over one 30-minute session in our [facility] outpatient infusion clinic. You will need to stay about 60 minutes after the infusion to ensure you  are tolerating it well and to watch for any allergic reaction to the medication. More information will be given to you at the time of your appointment.   Important information:  . The potential side effects: 2-4% of recipients experience nausea, vomiting, diarrhea, dizziness, headaches, itching, worsening fevers or chills for around 24 hours. . There have been no serious infusion-related reactions. . Of the more than 3,000 patients who received the infusion, only one had an allergic response that ended once the infusion was stopped. This is why we monitor all of our patients closely for 60 minutes after the infusion.  . The COVID-19 vaccine (including boosters) must be delayed at least 90 days after receiving this infusion.  . The medication itself is free, but your insurance will be charged an infusion fee. The amount you may owe later varies from insurance to insurance. If you do not have insurance, we can put you in touch with our billing department. Please contact your insurance agent to discuss prior to your appointment if you would like further details about billing specific to your policy. The CMS code is: M47  . If you have been tested outside of a [facility], you MUST bring a copy of your positive test with you the morning of your appointment. You may take a photo of this and upload to your MyChart portal,  have the testing facility fax the result to [phone] or email a copy to [email or web address].    You have been scheduled to receive the monoclonal antibody therapy at [facility]:  02/27/20 at 11:30am   The address for the infusion clinic site  is:   --The GPS address is [address], and the parking is located near the [employer] building where you will see a "COVID19 Infusion" feather banner marking the entrance to parking. (See photos below.)            --Enter into the 2nd entrance where the "wave, flag banner" is at the road. Turn into this 2nd  entrance and immediately turn left or right to park in one of the marked spaces.   --Please stay in your car and call the desk for assistance inside at [phone]. Let us know which space you are in.    --The average time in the department is roughly 90 minutes to two hours for monoclonal treatment. This includes preparation of the medication, IV start and the required 60-minute monitoring after the infusion.     Should you develop worsening shortness of breath, chest pain or severe breathing problems, please do not wait for this appointment and go to the emergency room for evaluation and treatment instead. You will undergo another oxygen screen before your infusion to ensure this is the best treatment option for you. There is a chance that the best decision may be to send you to the emergency room for evaluation at the time of your appointment.    The day of your visit, you should: Marland Kitchen Get plenty of rest the night before and drink plenty of water. . Eat a light meal/snack before coming and take your medications as prescribed.  . Wear warm, comfortable clothes with a shirt that can roll-up over the elbow (will need IV start).  . Wear a mask.  . Consider bringing an activity to help pass the time.     Terri Piedra, NP 02/26/2020 11:31 AM

## 2020-02-26 NOTE — Progress Notes (Signed)
I connected by phone with Jennifer Barr on 02/26/2020 at 11:32 AM to discuss the potential use of a new treatment for mild to moderate COVID-19 viral infection in non-hospitalized patients.  This patient is a 65 y.o. female that meets the FDA criteria for Emergency Use Authorization of COVID monoclonal antibody sotrovimab.  Has a (+) direct SARS-CoV-2 viral test result  Has mild or moderate COVID-19   Is NOT hospitalized due to COVID-19  Is within 10 days of symptom onset  Has at least one of the high risk factor(s) for progression to severe COVID-19 and/or hospitalization as defined in EUA.  Specific high risk criteria : Immunosuppressive Disease or Treatment   I have spoken and communicated the following to the patient or parent/caregiver regarding COVID monoclonal antibody treatment:  1. FDA has authorized the emergency use for the treatment of mild to moderate COVID-19 in adults and pediatric patients with positive results of direct SARS-CoV-2 viral testing who are 59 years of age and older weighing at least 40 kg, and who are at high risk for progressing to severe COVID-19 and/or hospitalization.  2. The significant known and potential risks and benefits of COVID monoclonal antibody, and the extent to which such potential risks and benefits are unknown.  3. Information on available alternative treatments and the risks and benefits of those alternatives, including clinical trials.  4. Patients treated with COVID monoclonal antibody should continue to self-isolate and use infection control measures (e.g., wear mask, isolate, social distance, avoid sharing personal items, clean and disinfect "high touch" surfaces, and frequent handwashing) according to CDC guidelines.   5. The patient or parent/caregiver has the option to accept or refuse COVID monoclonal antibody treatment.  After reviewing this information with the patient, the patient has agreed to receive one of the available covid  19 monoclonal antibodies and will be provided an appropriate fact sheet prior to infusion.   Mauricio Po, FNP 02/26/2020 11:32 AM

## 2020-02-27 ENCOUNTER — Ambulatory Visit (HOSPITAL_COMMUNITY)
Admission: RE | Admit: 2020-02-27 | Discharge: 2020-02-27 | Disposition: A | Discharge status: Home / Self Care | Payer: 59 | Source: Ambulatory Visit | Attending: Pulmonary Disease | Admitting: Pulmonary Disease

## 2020-02-27 DIAGNOSIS — M3500 Sicca syndrome, unspecified: Secondary | ICD-10-CM | POA: Diagnosis not present

## 2020-02-27 DIAGNOSIS — U071 COVID-19: Secondary | ICD-10-CM | POA: Insufficient documentation

## 2020-02-27 MED ORDER — DIPHENHYDRAMINE HCL 50 MG/ML IJ SOLN: 50.0000 mg | Freq: Once | INTRAMUSCULAR | Status: DC | PRN

## 2020-02-27 MED ORDER — ALBUTEROL SULFATE HFA 108 (90 BASE) MCG/ACT IN AERS: 2.0000 | INHALATION_SPRAY | Freq: Once | RESPIRATORY_TRACT | Status: DC | PRN

## 2020-02-27 MED ORDER — FAMOTIDINE IN NACL 20-0.9 MG/50ML-% IV SOLN: 20.0000 mg | Freq: Once | INTRAVENOUS | Status: DC | PRN

## 2020-02-27 MED ORDER — SOTROVIMAB 500 MG/8ML IV SOLN
500.0000 mg | Freq: Once | INTRAVENOUS | Status: AC
  Administered 2020-02-27: 500 mg via INTRAVENOUS

## 2020-02-27 MED ORDER — EPINEPHRINE 0.3 MG/0.3ML IJ SOAJ: 0.3000 mg | Freq: Once | INTRAMUSCULAR | Status: DC | PRN

## 2020-02-27 MED ORDER — SODIUM CHLORIDE 0.9 % IV SOLN: INTRAVENOUS | Status: DC | PRN

## 2020-02-27 MED ORDER — METHYLPREDNISOLONE SODIUM SUCC 125 MG IJ SOLR: 125.0000 mg | Freq: Once | INTRAMUSCULAR | Status: DC | PRN

## 2020-02-27 NOTE — Progress Notes (Signed)
Diagnosis: COVID-19  Physician: Dr. Patrick Wright  Procedure: Covid Infusion Clinic Med: Sotrovimab infusion - Provided patient with sotrovimab fact sheet for patients, parents, and caregivers prior to infusion.   Complications: No immediate complications noted  Discharge: Discharged home    

## 2020-02-27 NOTE — Discharge Instructions (Signed)

## 2020-02-27 NOTE — Progress Notes (Signed)
Patient reviewed Fact Sheet for Patients, Parents, and Caregivers for Emergency Use Authorization (EUA) of sotrovimab for the Treatment of Coronavirus. Patient also reviewed and is agreeable to the estimated cost of treatment. Patient is agreeable to proceed.   

## 2020-03-02 NOTE — Progress Notes (Signed)
Office Visit Note  Patient: Jennifer Barr             Date of Birth: 09-Feb-1955           MRN: [id number]             PCP: Alycia Rossetti, MD Referring: Alycia Rossetti, MD Visit Date: 03/16/2020 Occupation: @GUAROCC @  Subjective:  Other (Patient reports positive COVID 19 diagnosis on 02/23/2020. Symptoms resolved on approximately 02/28/2020. Patient received the monoclonal antibody infusion. Imuran is still on hold )   History of Present Illness: Jennifer Barr is a 65 y.o. female with history of Sjogren's and vasculitis.  She developed COVID-19 infection on February 23, 2020.  She discontinued Imuran on February 27, 2020.  She received monoclonal antibody infusion and did not have any complications from [id number] virus infection.  She has been holding Imuran since that.  She has not had a flare of vasculitis.  Continues to have dry eye symptoms.  She complains of increased Raynauds symptoms.  Activities of Daily Living:  Patient reports morning stiffness for 0 minutes.   Patient Denies nocturnal pain.  Difficulty dressing/grooming: Denies Difficulty climbing stairs: Denies Difficulty getting out of chair: Denies Difficulty using hands for taps, buttons, cutlery, and/or writing: Denies  Review of Systems  Constitutional: Negative for fatigue.  HENT: Positive for mouth dryness and nose dryness. Negative for mouth sores.   Eyes: Positive for dryness. Negative for pain and itching.  Respiratory: Negative for shortness of breath and difficulty breathing.   Cardiovascular: Negative for chest pain and palpitations.  Gastrointestinal: Negative for blood in stool, constipation and diarrhea.  Endocrine: Negative for increased urination.  Genitourinary: Negative for difficulty urinating and painful urination.  Musculoskeletal: Negative for arthralgias, joint pain, joint swelling, myalgias, morning stiffness, muscle tenderness and myalgias.  Skin: Positive for color change. Negative for  rash and redness.  Allergic/Immunologic: Negative for susceptible to infections.  Neurological: Positive for numbness. Negative for dizziness, headaches, memory loss and weakness.  Hematological: Negative for bruising/bleeding tendency.  Psychiatric/Behavioral: Negative for confusion and sleep disturbance.    PMFS History:  Patient Active Problem List   Diagnosis Date Noted  . ANA positive 02/10/2016  . Rheumatoid factor positive 02/10/2016  . Primary osteoarthritis, right hand 02/10/2016  . Scoliosis 02/10/2016  . Raynaud's syndrome without gangrene 02/10/2016  . Sjogren's disease ([place]) 05/07/2014  . Vasculitis ([place]) 05/07/2014  . Hashimoto's thyroiditis 05/07/2014  . Acute sinusitis 01/07/2013  . Bronchitis 01/07/2013  . Conjunctivitis 01/07/2013  . Nevus of skin of lip 01/07/2013  . Laryngitis 01/07/2013    Past Medical History:  Diagnosis Date  . Osteopenia 07/2017   T score -1.3 FRAX 7.2% / 0.6%  . Premature ovarian failure   . Sjoegren syndrome    Dr. Estanislado Pandy  . Thyroid disease    Hashiomto  . Vasculitis ([place])    Dr. Estanislado Pandy    Family History  Problem Relation Age of Onset  . Diabetes Mother   . Diabetes Father   . Scleroderma Daughter    History reviewed. No pertinent surgical history. Social History   Social History Narrative  . Not on file   Immunization History  Administered Date(s) Administered  . Influenza,inj,Quad PF,6+ Mos 10/14/2018  . Moderna Sars-Covid-2 Vaccination 03/25/2019, 04/22/2019, 09/01/2019  . Tdap 06/06/2018     Objective: Vital Signs: BP 126/72 (BP Location: Left Arm, Patient Position: Sitting, Cuff Size: Normal)   Pulse 60   Resp 14   Ht  5\' 9"  (1.753 m)   Wt 152 lb 6.4 oz (69.1 kg)   BMI 22.51 kg/m    Physical Exam Vitals and nursing note reviewed.  Constitutional:      Appearance: She is well-developed and well-nourished.  HENT:     Head: Normocephalic and atraumatic.  Eyes:     Extraocular Movements: EOM  normal.     Conjunctiva/sclera: Conjunctivae normal.  Cardiovascular:     Rate and Rhythm: Normal rate and regular rhythm.     Pulses: Intact distal pulses.     Heart sounds: Normal heart sounds.  Pulmonary:     Effort: Pulmonary effort is normal.     Breath sounds: Normal breath sounds.  Abdominal:     General: Bowel sounds are normal.     Palpations: Abdomen is soft.  Musculoskeletal:     Cervical back: Normal range of motion.  Lymphadenopathy:     Cervical: No cervical adenopathy.  Skin:    General: Skin is warm and dry.     Capillary Refill: Capillary refill takes less than 2 seconds.  Neurological:     Mental Status: She is alert and oriented to person, place, and time.  Psychiatric:        Mood and Affect: Mood and affect normal.        Behavior: Behavior normal.      Musculoskeletal Exam: C-spine, thoracic and lumbar spine with good range of motion.  Shoulder joints, elbow joints, wrist joints, MCPs PIPs and DIPs with good range of motion with no synovitis.  Hip joints, knee joints, ankles, MTPs and PIPs with good range of motion with no synovitis.  CDAI Exam: CDAI Score: - Patient Global: -; Provider Global: - Swollen: -; Tender: - Joint Exam 03/16/2020   No joint exam has been documented for this visit   There is currently no information documented on the homunculus. Go to the Rheumatology activity and complete the homunculus joint exam.  Investigation: No additional findings.  Imaging: No results found.  Recent Labs: Lab Results  Component Value Date   WBC 5.5 12/02/2019   HGB 13.8 12/02/2019   PLT 267 12/02/2019   NA 134 (L) 12/03/2019   K 4.3 12/03/2019   CL 101 12/03/2019   CO2 25 12/03/2019   GLUCOSE 83 12/03/2019   BUN 14 12/03/2019   CREATININE 0.89 12/03/2019   BILITOT 0.9 12/03/2019   ALKPHOS 81 09/01/2019   AST 40 (H) 12/03/2019   ALT 10 12/03/2019   PROT 7.8 12/03/2019   ALBUMIN 4.3 09/01/2019   CALCIUM 9.3 12/03/2019   GFRAA 80  12/03/2019    Speciality Comments: PLQ Eye Exam: 04/22/2019 WNL @ [facility] Otter, OD PA  Procedures:  No procedures performed Allergies: Sulfa antibiotics   Assessment / Plan:     Visit Diagnoses: Sjogren's syndrome with other organ involvement ([place]) - She has been on Imuran and pilocarpine for management of her symptoms.  She has been off Imuran since February 14 due to COVID-19 infection.  She has not had a relapse of vasculitis.  We will check labs today.  If her labs are normal she will resume Imuran at a lower dose which would be 50 mg p.o. daily.  She has done very well over the last few months.  She would like to taper dose of Imuran.  We will recheck labs in 3 months and then every 3 months to monitor for drug toxicity.  High risk medication use -(alternating Imuran 50 mg and 75 mg  every other day-on hold).PLQ was discontinued and June due to lymphopenia.Marland Kitchen PLQ eye exam: 04/18/18  Vasculitis ([place]) - No recurrence since July 2020.    Raynaud's syndrome without gangrene-she continues to have severe Raynaud's symptoms.  I discussed use of amlodipine but she declined.  Lymphopenia - Referred to Dr.Kale  Primary osteoarthritis of both hands-currently not having much symptoms.  Scoliosis  History of hypothyroidism  COVID-19 virus infection - 03/23/2019 without any complications.  She had IV monoclonal antibody infusion.  She is fully vaccinated against COVID-19 and also had a third dose.  A fourth dose 6 months after the third dose was advised.  Orders: Orders Placed This Encounter  Procedures  . CBC with Differential/Platelet  . COMPLETE METABOLIC PANEL WITH GFR   No orders of the defined types were placed in this encounter.   Follow-Up Instructions: Return in about 5 months (around 08/16/2020) for Sjogren's, vasculitis.   Bo Merino, MD  Note - This record has been created using Editor, commissioning.  Chart creation errors have been sought, but may not always  have been  located. Such creation errors do not reflect on  the standard of medical care.

## 2020-03-16 ENCOUNTER — Ambulatory Visit: Payer: PRIVATE HEALTH INSURANCE | Admitting: Rheumatology

## 2020-03-16 ENCOUNTER — Other Ambulatory Visit: Payer: Self-pay

## 2020-03-16 ENCOUNTER — Encounter: Payer: Self-pay | Admitting: Rheumatology

## 2020-03-16 VITALS — BP 126/72 | HR 60 | Resp 14 | Ht 69.0 in | Wt 152.4 lb

## 2020-03-16 DIAGNOSIS — Z8639 Personal history of other endocrine, nutritional and metabolic disease: Secondary | ICD-10-CM

## 2020-03-16 DIAGNOSIS — M3509 Sicca syndrome with other organ involvement: Secondary | ICD-10-CM

## 2020-03-16 DIAGNOSIS — M19042 Primary osteoarthritis, left hand: Secondary | ICD-10-CM

## 2020-03-16 DIAGNOSIS — I7300 Raynaud's syndrome without gangrene: Secondary | ICD-10-CM | POA: Diagnosis not present

## 2020-03-16 DIAGNOSIS — M19041 Primary osteoarthritis, right hand: Secondary | ICD-10-CM

## 2020-03-16 DIAGNOSIS — D72810 Lymphocytopenia: Secondary | ICD-10-CM

## 2020-03-16 DIAGNOSIS — Z79899 Other long term (current) drug therapy: Secondary | ICD-10-CM

## 2020-03-16 DIAGNOSIS — I776 Arteritis, unspecified: Secondary | ICD-10-CM

## 2020-03-16 DIAGNOSIS — M419 Scoliosis, unspecified: Secondary | ICD-10-CM

## 2020-03-16 DIAGNOSIS — U071 COVID-19: Secondary | ICD-10-CM

## 2020-03-16 NOTE — Patient Instructions (Addendum)
Standing Labs We placed an order today for your standing lab work.   Please have your standing labs drawn in June and every 3 months  If possible, please have your labs drawn 2 weeks prior to your appointment so that the provider can discuss your results at your appointment.  We have open lab daily Monday through Thursday from 1:30-4:30 PM and Friday from 1:30-4:00 PM at the office of Dr. Bo Merino, [facility].   Please be advised, all patients with office appointments requiring lab work will take precedents over walk-in lab work.  If possible, please come for your lab work on Monday and Friday afternoons, as you may experience shorter wait times. The office is located at [address], [place], [place], [place] [address] No appointment is necessary.   Labs are drawn by [facility]. Please bring your co-pay at the time of your lab draw.  You may receive a bill from [facility] for your lab work.  If you wish to have your labs drawn at another location, please call the office 24 hours in advance to send orders.  If you have any questions regarding directions or hours of operation,  please call [phone].   As a reminder, please drink plenty of water prior to coming for your lab work. Thanks!  COVID-19 vaccine recommendations:   COVID-19 vaccine is recommended for everyone (unless you are allergic to a vaccine component), even if you are on a medication that suppresses your immune system.   It is recommended that individuals on immunosuppressive therapy should receive first 3 COVID-19 vaccine doses 1 month apart and then a fourth dose (booster) 6 months after the third dose.  Do not take Tylenol or any anti-inflammatory medications (NSAIDs) 24 hours prior to the COVID-19 vaccination.   There is no direct evidence about the efficacy of the COVID-19 vaccine in individuals who are on medications that suppress the immune system.   Even if you are fully vaccinated, and you are  on any medications that suppress your immune system, please continue to wear a mask, maintain at least six feet social distance and practice hand hygiene.   If you develop a COVID-19 infection, please contact your PCP or our office to determine if you need monoclonal antibody infusion.  The booster vaccine is now available for immunocompromised patients.   Please see the following web sites for updated information.   [email or web address]  Vaccines You are taking a medication(s) that can suppress your immune system.  The following immunizations are recommended: . Flu annually . Covid-19  . Pneumonia (Pneumovax 23 and Prevnar 13 spaced at least 1 year apart) . Shingrix (after age 82)  Please check with your PCP to make sure you are up to date.

## 2020-03-17 LAB — CBC WITH DIFFERENTIAL/PLATELET
Absolute Monocytes: 418 cells/uL (ref 200–950)
Basophils Absolute: 19 cells/uL (ref 0–200)
Basophils Relative: 0.5 %
Eosinophils Absolute: 30 cells/uL (ref 15–500)
Eosinophils Relative: 0.8 %
HCT: 39.9 % (ref 35.0–45.0)
Hemoglobin: 13.2 g/dL (ref 11.7–15.5)
Lymphs Abs: 770 cells/uL — ABNORMAL LOW (ref 850–3900)
MCH: 29.4 pg (ref 27.0–33.0)
MCHC: 33.1 g/dL (ref 32.0–36.0)
MCV: 88.9 fL (ref 80.0–100.0)
MPV: 9.9 fL (ref 7.5–12.5)
Monocytes Relative: 11.3 %
Neutro Abs: 2464 cells/uL (ref 1500–7800)
Neutrophils Relative %: 66.6 %
Platelets: 247 10*3/uL (ref 140–400)
RBC: 4.49 10*6/uL (ref 3.80–5.10)
RDW: 12.6 % (ref 11.0–15.0)
Total Lymphocyte: 20.8 %
WBC: 3.7 10*3/uL — ABNORMAL LOW (ref 3.8–10.8)

## 2020-03-17 LAB — COMPLETE METABOLIC PANEL WITH GFR
AG Ratio: 1.1 (calc) (ref 1.0–2.5)
ALT: 12 U/L (ref 6–29)
AST: 40 U/L — ABNORMAL HIGH (ref 10–35)
Albumin: 4.2 g/dL (ref 3.6–5.1)
Alkaline phosphatase (APISO): 75 U/L (ref 37–153)
BUN: 13 mg/dL (ref 7–25)
CO2: 26 mmol/L (ref 20–32)
Calcium: 9.3 mg/dL (ref 8.6–10.4)
Chloride: 105 mmol/L (ref 98–110)
Creat: 0.85 mg/dL (ref 0.50–0.99)
GFR, Est African American: 84 mL/min/{1.73_m2} (ref >=60)
GFR, Est Non African American: 72 mL/min/{1.73_m2} (ref >=60)
Globulin: 3.9 g/dL (calc) — ABNORMAL HIGH (ref 1.9–3.7)
Glucose, Bld: 95 mg/dL (ref 65–99)
Potassium: 4.4 mmol/L (ref 3.5–5.3)
Sodium: 138 mmol/L (ref 135–146)
Total Bilirubin: 0.7 mg/dL (ref 0.2–1.2)
Total Protein: 8.1 g/dL (ref 6.1–8.1)

## 2020-03-17 NOTE — Progress Notes (Signed)
White cell count is low at 3.7 and AST is mildly elevated but is stable.  Patient be okay for patient to resume Imuran 50 mg p.o. daily.  Please advise her to repeat labs in 1 month and then every 3 months.

## 2020-03-17 NOTE — Progress Notes (Signed)
White cell count is mildly decreased.  We can repeat her labs in 3 months.

## 2020-06-17 ENCOUNTER — Other Ambulatory Visit: Payer: Self-pay

## 2020-06-17 DIAGNOSIS — Z79899 Other long term (current) drug therapy: Secondary | ICD-10-CM

## 2020-06-17 LAB — CBC WITH DIFFERENTIAL/PLATELET
Absolute Monocytes: 403 cells/uL (ref 200–950)
Basophils Absolute: 21 cells/uL (ref 0–200)
Basophils Relative: 0.4 %
Eosinophils Absolute: 11 cells/uL — ABNORMAL LOW (ref 15–500)
Eosinophils Relative: 0.2 %
HCT: 40.3 % (ref 35.0–45.0)
Hemoglobin: 12.9 g/dL (ref 11.7–15.5)
Lymphs Abs: 530 cells/uL — ABNORMAL LOW (ref 850–3900)
MCH: 28.4 pg (ref 27.0–33.0)
MCHC: 32 g/dL (ref 32.0–36.0)
MCV: 88.6 fL (ref 80.0–100.0)
MPV: 9.9 fL (ref 7.5–12.5)
Monocytes Relative: 7.6 %
Neutro Abs: 4335 cells/uL (ref 1500–7800)
Neutrophils Relative %: 81.8 %
Platelets: 245 10*3/uL (ref 140–400)
RBC: 4.55 10*6/uL (ref 3.80–5.10)
RDW: 13 % (ref 11.0–15.0)
Total Lymphocyte: 10 %
WBC: 5.3 10*3/uL (ref 3.8–10.8)

## 2020-06-17 LAB — COMPLETE METABOLIC PANEL WITH GFR
AG Ratio: 1.2 (calc) (ref 1.0–2.5)
ALT: 8 U/L (ref 6–29)
AST: 41 U/L — ABNORMAL HIGH (ref 10–35)
Albumin: 4.3 g/dL (ref 3.6–5.1)
Alkaline phosphatase (APISO): 75 U/L (ref 37–153)
BUN: 20 mg/dL (ref 7–25)
CO2: 23 mmol/L (ref 20–32)
Calcium: 9.4 mg/dL (ref 8.6–10.4)
Chloride: 103 mmol/L (ref 98–110)
Creat: 0.89 mg/dL (ref 0.50–0.99)
GFR, Est African American: 79 mL/min/{1.73_m2} (ref >=60)
GFR, Est Non African American: 68 mL/min/{1.73_m2} (ref >=60)
Globulin: 3.6 g/dL (calc) (ref 1.9–3.7)
Glucose, Bld: 90 mg/dL (ref 65–99)
Potassium: 4.4 mmol/L (ref 3.5–5.3)
Sodium: 135 mmol/L (ref 135–146)
Total Bilirubin: 0.8 mg/dL (ref 0.2–1.2)
Total Protein: 7.9 g/dL (ref 6.1–8.1)

## 2020-06-18 NOTE — Progress Notes (Signed)
AST remains borderline elevated-41. Rest of CMP WNL.  Absolute lymphocytes and absolute eosinophils are low but stable overall.  She is followed by Dr. Irene Limbo.

## 2020-06-24 ENCOUNTER — Other Ambulatory Visit: Payer: Self-pay | Admitting: Obstetrics & Gynecology

## 2020-06-24 DIAGNOSIS — Z1231 Encounter for screening mammogram for malignant neoplasm of breast: Secondary | ICD-10-CM

## 2020-07-31 ENCOUNTER — Other Ambulatory Visit: Payer: Self-pay | Admitting: Obstetrics & Gynecology

## 2020-07-31 ENCOUNTER — Other Ambulatory Visit: Payer: Self-pay | Admitting: Physician Assistant

## 2020-08-02 NOTE — Telephone Encounter (Addendum)
Next Visit: 08/17/2020  Last Visit: 03/16/2020  Last Fill: 02/12/2020  DX:  Sjogren's syndrome with other organ involvement  Current Dose per office note 03/16/2020: -(alternating Imuran 50 mg and 75 mg every other day-on hold). If her labs are normal she will resume Imuran at a lower dose which would be 50 mg p.o. daily.  Labs: 06/17/2020 AST remains borderline elevated-41. Rest of CMP WNL.  Absolute lymphocytes andabsolute eosinophils are low but stable overall.   Spoke with patient and verified she is taking Imuran 50 mg 1 tablet by mouth daily.   Okay to refill Imuran?

## 2020-08-02 NOTE — Telephone Encounter (Signed)
Medication refill request: Synthroid  Last AEX: 06-17-19 ML Next AEX: 08-23-20  Last MMG (if hormonal medication request): n/a Refill authorized: Today, please advise.   Medication pended for #30, 0RF. Please refill if appropriate.

## 2020-08-03 NOTE — Progress Notes (Signed)
Office Visit Note  Patient: Jennifer Barr             Date of Birth: [birth year]           MRN: [id number]             PCP: Alycia Rossetti, MD Referring: Alycia Rossetti, MD Visit Date: 08/17/2020 Occupation: '@GUAROCC'$ @  Subjective:  Other (Patient is scheduled for a dental implant next week. Patient also has questions regarding the shingles and pneumonia vaccines. )   History of Present Illness: Jennifer Barr is a 65 y.o. female with history of Sjogren's syndrome and osteoarthritis.  She states she continues to have dry mouth and dry eyes.  She will be requiring a dental implant next week.  She denies any recurrence of vasculitis rash.  She has some stiffness in her joints which is manageable.  She denies any shortness of breath or palpitations.  Raynauds is not active currently.  Activities of Daily Living:  Patient reports morning stiffness for 0 minutes.   Patient Denies nocturnal pain.  Difficulty dressing/grooming: Denies Difficulty climbing stairs: Denies Difficulty getting out of chair: Denies Difficulty using hands for taps, buttons, cutlery, and/or writing: Denies  Review of Systems  Constitutional:  Negative for fatigue.  HENT:  Positive for mouth dryness and nose dryness. Negative for mouth sores.   Eyes:  Positive for dryness. Negative for pain and itching.  Respiratory:  Negative for shortness of breath and difficulty breathing.   Cardiovascular:  Negative for chest pain and palpitations.  Gastrointestinal:  Negative for blood in stool, constipation and diarrhea.  Endocrine: Negative for increased urination.  Genitourinary:  Negative for difficulty urinating.  Musculoskeletal:  Negative for joint pain, joint pain, joint swelling, myalgias, morning stiffness, muscle tenderness and myalgias.  Skin:  Negative for color change, rash and redness.  Allergic/Immunologic: Negative for susceptible to infections.  Neurological:  Positive for numbness. Negative for  dizziness, headaches, memory loss and weakness.  Hematological:  Negative for bruising/bleeding tendency.  Psychiatric/Behavioral:  Negative for confusion.    PMFS History:  Patient Active Problem List   Diagnosis Date Noted   ANA positive 02/10/2016   Rheumatoid factor positive 02/10/2016   Primary osteoarthritis, right hand 02/10/2016   Scoliosis 02/10/2016   Raynaud's syndrome without gangrene 02/10/2016   Sjogren's disease ([place]) 05/07/2014   Vasculitis ([place]) 05/07/2014   Hashimoto's thyroiditis 05/07/2014   Acute sinusitis 01/07/2013   Bronchitis 01/07/2013   Conjunctivitis 01/07/2013   Nevus of skin of lip 01/07/2013   Laryngitis 01/07/2013    Past Medical History:  Diagnosis Date   Osteopenia 07/2017   T score -1.3 FRAX 7.2% / 0.6%   Premature ovarian failure    Sjoegren syndrome    Dr. Estanislado Pandy   Thyroid disease    Hashiomto   Vasculitis ([place])    Dr. Estanislado Pandy    Family History  Problem Relation Age of Onset   Diabetes Mother    Diabetes Father    Scleroderma Daughter    History reviewed. No pertinent surgical history. Social History   Social History Narrative   Not on file   Immunization History  Administered Date(s) Administered   Influenza,inj,Quad PF,6+ Mos 10/14/2018   Moderna Sars-Covid-2 Vaccination 03/25/2019, 04/22/2019, 09/01/2019   Tdap 06/06/2018     Objective: Vital Signs: BP 126/71 (BP Location: Left Arm, Patient Position: Sitting, Cuff Size: Normal)   Pulse 67   Resp 14   Ht 5' 8.5" (1.74 m)   Wt  145 lb 12.8 oz (66.1 kg)   BMI 21.85 kg/m    Physical Exam Vitals and nursing note reviewed.  Constitutional:      Appearance: She is well-developed.  HENT:     Head: Normocephalic and atraumatic.  Eyes:     Conjunctiva/sclera: Conjunctivae normal.  Cardiovascular:     Rate and Rhythm: Normal rate and regular rhythm.     Heart sounds: Normal heart sounds.  Pulmonary:     Effort: Pulmonary effort is normal.     Breath sounds:  Normal breath sounds.  Abdominal:     General: Bowel sounds are normal.     Palpations: Abdomen is soft.  Musculoskeletal:     Cervical back: Normal range of motion.  Lymphadenopathy:     Cervical: No cervical adenopathy.  Skin:    General: Skin is warm and dry.     Capillary Refill: Capillary refill takes less than 2 seconds.  Neurological:     Mental Status: She is alert and oriented to person, place, and time.  Psychiatric:        Behavior: Behavior normal.     Musculoskeletal Exam: C-spine thoracic and lumbar spine with good range of motion.  Shoulder joints, elbow joints, wrist joints with good range of motion.  She has bilateral CMC, PIP and DIP thickening consistent with osteoarthritis.  Hip joints, knee joints, ankles, MTPs and PIPs with good range of motion with no synovitis.  CDAI Exam: CDAI Score: -- Patient Global: --; Provider Global: -- Swollen: --; Tender: -- Joint Exam 08/17/2020   No joint exam has been documented for this visit   There is currently no information documented on the homunculus. Go to the Rheumatology activity and complete the homunculus joint exam.  Investigation: No additional findings.  Imaging: No results found.  Recent Labs: Lab Results  Component Value Date   WBC 5.3 06/17/2020   HGB 12.9 06/17/2020   PLT 245 06/17/2020   NA 135 06/17/2020   K 4.4 06/17/2020   CL 103 06/17/2020   CO2 23 06/17/2020   GLUCOSE 90 06/17/2020   BUN 20 06/17/2020   CREATININE 0.89 06/17/2020   BILITOT 0.8 06/17/2020   ALKPHOS 81 09/01/2019   AST 41 (H) 06/17/2020   ALT 8 06/17/2020   PROT 7.9 06/17/2020   ALBUMIN 4.3 09/01/2019   CALCIUM 9.4 06/17/2020   GFRAA 79 06/17/2020    Speciality Comments: PLQ Eye Exam: 04/22/2019 WNL @ [facility]ter, OD PA  Procedures:  No procedures performed Allergies: Sulfa antibiotics   Assessment / Plan:     Visit Diagnoses: Sjogren's syndrome with other organ involvement (HCC)-she continues to have sicca  symptoms.  Over-the-counter products were discussed.  She states that her symptoms are manageable.  She has been tolerating Imuran.  Plaquenil was discontinued due to lymphopenia.  High risk medication use - Imuran 50 mg by mouth daily. (PLQ was discontinued and June due to lymphopenia).  Labs from June 17, 2020 were reviewed which were within normal limits except for mildly elevated LFTs.  We have discussed elevated LFTs in the past.  She does not want any further work-up at this point.  Patient is getting dental implant.  I advised her to stop Imuran a week before the surgery and restart 1 week after if there is no infection.  Updated information regarding realization was also placed in the AVS.  Vasculitis ([place]) - No recurrence since July 2020.  Since she has been retired and her stress level has  been better she has not had any flares of vasculitis.  Raynaud's syndrome without gangrene-doing better during the summertime.  Lymphopenia -she was evaluated by Dr.Kale in the past and was advised that lymphopenia is related to medications and autoimmune disease.  Primary osteoarthritis of both hands-joint protection muscle strengthening was discussed.  Scoliosis  History of hypothyroidism  Osteopenia of multiple sites - July 11, 2017 DEXA scan showed osteopenia with T score -1.3 and right femoral neck.  Use of calcium, vitamin D and resistive exercises were discussed.  COVID-19 virus infection - 03/23/2019 without any complications.  She had IV monoclonal antibody infusion.   Orders: No orders of the defined types were placed in this encounter.  No orders of the defined types were placed in this encounter.    Follow-Up Instructions: Return in about 6 months (around 02/17/2021) for Sjogren's.   Bo Merino, MD  Note - This record has been created using Editor, commissioning.  Chart creation errors have been sought, but may not always  have been located. Such creation errors do not reflect on   the standard of medical care.

## 2020-08-17 ENCOUNTER — Ambulatory Visit (INDEPENDENT_AMBULATORY_CARE_PROVIDER_SITE_OTHER): Payer: 59 | Admitting: Rheumatology

## 2020-08-17 ENCOUNTER — Other Ambulatory Visit: Payer: Self-pay

## 2020-08-17 ENCOUNTER — Encounter: Payer: Self-pay | Admitting: Rheumatology

## 2020-08-17 ENCOUNTER — Ambulatory Visit
Admission: RE | Admit: 2020-08-17 | Discharge: 2020-08-17 | Disposition: A | Discharge status: Home / Self Care | Payer: 59 | Source: Ambulatory Visit | Attending: Obstetrics & Gynecology | Admitting: Obstetrics & Gynecology

## 2020-08-17 VITALS — BP 126/71 | HR 67 | Resp 14 | Ht 68.5 in | Wt 145.8 lb

## 2020-08-17 DIAGNOSIS — D72810 Lymphocytopenia: Secondary | ICD-10-CM

## 2020-08-17 DIAGNOSIS — I776 Arteritis, unspecified: Secondary | ICD-10-CM

## 2020-08-17 DIAGNOSIS — M419 Scoliosis, unspecified: Secondary | ICD-10-CM

## 2020-08-17 DIAGNOSIS — I7300 Raynaud's syndrome without gangrene: Secondary | ICD-10-CM

## 2020-08-17 DIAGNOSIS — Z8639 Personal history of other endocrine, nutritional and metabolic disease: Secondary | ICD-10-CM

## 2020-08-17 DIAGNOSIS — M3509 Sicca syndrome with other organ involvement: Secondary | ICD-10-CM | POA: Diagnosis not present

## 2020-08-17 DIAGNOSIS — Z79899 Other long term (current) drug therapy: Secondary | ICD-10-CM

## 2020-08-17 DIAGNOSIS — M8589 Other specified disorders of bone density and structure, multiple sites: Secondary | ICD-10-CM

## 2020-08-17 DIAGNOSIS — M19042 Primary osteoarthritis, left hand: Secondary | ICD-10-CM

## 2020-08-17 DIAGNOSIS — U071 COVID-19: Secondary | ICD-10-CM

## 2020-08-17 DIAGNOSIS — Z1231 Encounter for screening mammogram for malignant neoplasm of breast: Secondary | ICD-10-CM

## 2020-08-17 DIAGNOSIS — M19041 Primary osteoarthritis, right hand: Secondary | ICD-10-CM

## 2020-08-17 NOTE — Patient Instructions (Signed)
Standing Labs We placed an order today for your standing lab work.   Please have your standing labs drawn in September and every 3 months  If possible, please have your labs drawn 2 weeks prior to your appointment so that the provider can discuss your results at your appointment.  Please note that you may see your imaging and lab results in [place] before we have reviewed them. We may be awaiting multiple results to interpret others before contacting you. Please allow our office up to 72 hours to thoroughly review all of the results before contacting the office for clarification of your results.  We have open lab daily: Monday through Thursday from 1:30-4:30 PM and Friday from 1:30-4:00 PM at the office of Dr. Bo Merino, [facility].   Please be advised, all patients with office appointments requiring lab work will take precedent over walk-in lab work.  If possible, please come for your lab work on Monday and Friday afternoons, as you may experience shorter wait times. The office is located at [address] [place], [place], [place] [address] No appointment is necessary.   Labs are drawn by [facility]. Please bring your co-pay at the time of your lab draw.  You may receive a bill from [facility] for your lab work.  If you wish to have your labs drawn at another location, please call the office 24 hours in advance to send orders.  If you have any questions regarding directions or hours of operation,  please call [phone].   As a reminder, please drink plenty of water prior to coming for your lab work. Thanks!   Vaccines You are taking a medication(s) that can suppress your immune system.  The following immunizations are recommended: Flu annually Covid-19  Td/Tdap (tetanus, diphtheria, pertussis) every 10 years Pneumonia (Prevnar 15 then Pneumovax 23 at least 1 year apart.  Alternatively, can take Prevnar 20 without needing additional dose) Shingrix (after age 60): 2  doses from 4 weeks to 6 months apart  Please check with your PCP to make sure you are up to date.   If you test POSITIVE for COVID19 and have MILD to MODERATE symptoms: First, call your PCP if you would like to receive COVID19 treatment AND Hold your medications during the infection and for at least 1 week after your symptoms have resolved: Injectable medication (Benlysta, Cimzia, Cosentyx, Enbrel, Humira, Orencia, Remicade, Simponi, Stelara, Taltz, Tremfya) Methotrexate Leflunomide (Arava) Azathioprine Mycophenolate (Cellcept) Roma Kayser, or Rinvoq Otezla If you take Actemra or Kevzara, you DO NOT need to hold these for COVID19 infection.  If you test POSITIVE for COVID19 and have NO symptoms: First, call your PCP if you would like to receive COVID19 treatment AND Hold your medications for at least 10 days after the day that you tested positive Injectable medication (Benlysta, Cimzia, Cosentyx, Enbrel, Humira, Orencia, Remicade, Simponi, Stelara, Taltz, Tremfya) Methotrexate Leflunomide (Arava) Azathioprine Mycophenolate (Cellcept) Roma Kayser, or Rinvoq Otezla If you take Actemra or Kevzara, you DO NOT need to hold these for COVID19 infection.  If you have signs or symptoms of an infection or start antibiotics: First, call your PCP for workup of your infection. Hold your medication through the infection, until you complete your antibiotics, and until symptoms resolve if you take the following: Injectable medication (Actemra, Benlysta, Cimzia, Cosentyx, Enbrel, Humira, Kevzara, Orencia, Remicade, Simponi, Stelara, Taltz, Tremfya) Methotrexate Leflunomide (Arava) Mycophenolate (Cellcept) Roma Kayser, or Rinvoq  Heart Disease Prevention   Your inflammatory disease increases your risk of  heart disease which includes heart attack, stroke, atrial fibrillation (irregular heartbeats), high blood pressure, heart failure and atherosclerosis (plaque in the arteries).   It is important to reduce your risk by:   Keep blood pressure, cholesterol, and blood sugar at healthy levels   Smoking Cessation   Maintain a healthy weight  BMI 20-25   Eat a healthy diet  Plenty of fresh fruit, vegetables, and whole grains  Limit saturated fats, foods high in sodium, and added sugars  DASH and Mediterranean diet   Increase physical activity  Recommend moderate physically activity for 150 minutes per week/ 30 minutes a day for five days a week These can be broken up into three separate ten-minute sessions during the day.   Reduce Stress  Meditation, slow breathing exercises, yoga, coloring books  Dental visits twice a year

## 2020-08-18 ENCOUNTER — Ambulatory Visit: Payer: 59

## 2020-08-23 ENCOUNTER — Ambulatory Visit (INDEPENDENT_AMBULATORY_CARE_PROVIDER_SITE_OTHER): Payer: 59 | Admitting: Obstetrics & Gynecology

## 2020-08-23 ENCOUNTER — Other Ambulatory Visit (HOSPITAL_COMMUNITY): Payer: Self-pay

## 2020-08-23 ENCOUNTER — Other Ambulatory Visit (HOSPITAL_COMMUNITY)
Admission: RE | Admit: 2020-08-23 | Discharge: 2020-08-23 | Disposition: A | Discharge status: Home / Self Care | Payer: 59 | Source: Ambulatory Visit | Attending: Obstetrics & Gynecology | Admitting: Obstetrics & Gynecology

## 2020-08-23 ENCOUNTER — Other Ambulatory Visit: Payer: Self-pay

## 2020-08-23 ENCOUNTER — Encounter: Payer: Self-pay | Admitting: Obstetrics & Gynecology

## 2020-08-23 VITALS — BP 138/74 | HR 59 | Ht 68.5 in | Wt 149.0 lb

## 2020-08-23 DIAGNOSIS — Z01419 Encounter for gynecological examination (general) (routine) without abnormal findings: Secondary | ICD-10-CM

## 2020-08-23 DIAGNOSIS — Z780 Asymptomatic menopausal state: Secondary | ICD-10-CM | POA: Diagnosis not present

## 2020-08-23 DIAGNOSIS — E063 Autoimmune thyroiditis: Secondary | ICD-10-CM

## 2020-08-23 DIAGNOSIS — M3509 Sicca syndrome with other organ involvement: Secondary | ICD-10-CM

## 2020-08-23 MED ORDER — SYNTHROID 75 MCG PO TABS: 75.0000 ug | ORAL_TABLET | Freq: Every day | ORAL | 4 refills | Status: DC

## 2020-08-23 MED ORDER — AMOXICILLIN 500 MG PO CAPS
500.0000 mg | ORAL_CAPSULE | Freq: Four times a day (QID) | ORAL | 0 refills | Status: DC
  Filled 2020-08-23: qty 28, 7d supply, fill #0

## 2020-08-23 NOTE — Progress Notes (Signed)
Jennifer Barr 1955/12/28 [id number]   History:    65 y.o.  [id number] Married.  Retired x 1 year.  [place] at [employer]  3 grand-children, 2 more on the way.  Having all the grand-children on Fridays.   RP:  Established patient presenting for annual gyn exam    HPI: Postmenopause, well on no HRT.  No PMB.  No pelvic pain.  Dryness with IC, recommend coconut oil.  Urine/BMs normal.  Breasts normal.  BMI 22.33.  Very fit.  Healthy nutrition.  Colonoscopy 2019.  Hypothyroidism on Synthroid. Sjogren's Disease, getting CMP/CBC regularly.   Past medical history,surgical history, family history and social history were all reviewed and documented in the EPIC chart.  Gynecologic History No LMP recorded. Patient is postmenopausal.  Obstetric History OB History  Gravida Para Term Preterm AB Living  '4 3   1   3  '$ SAB IAB Ectopic Multiple Live Births               # Outcome Date GA Lbr Len/2nd Weight Sex Delivery Anes PTL Lv  4 Gravida           3 Para           2 Para           1 Preterm              ROS: A ROS was performed and pertinent positives and negatives are included in the history.  GENERAL: No fevers or chills. HEENT: No change in vision, no earache, sore throat or sinus congestion. NECK: No pain or stiffness. CARDIOVASCULAR: No chest pain or pressure. No palpitations. PULMONARY: No shortness of breath, cough or wheeze. GASTROINTESTINAL: No abdominal pain, nausea, vomiting or diarrhea, melena or bright red blood per rectum. GENITOURINARY: No urinary frequency, urgency, hesitancy or dysuria. MUSCULOSKELETAL: No joint or muscle pain, no back pain, no recent trauma. DERMATOLOGIC: No rash, no itching, no lesions. ENDOCRINE: No polyuria, polydipsia, no heat or cold intolerance. No recent change in weight. HEMATOLOGICAL: No anemia or easy bruising or bleeding. NEUROLOGIC: No headache, seizures, numbness, tingling or weakness. PSYCHIATRIC: No depression, no loss of interest in normal activity or  change in sleep pattern.     Exam:   BP 138/74   Pulse (!) 59   Ht 5' 8.5" (1.74 m)   Wt 149 lb (67.6 kg)   SpO2 100%   BMI 22.33 kg/m   Body mass index is 22.33 kg/m.  General appearance : Well developed well nourished female. No acute distress HEENT: Eyes: no retinal hemorrhage or exudates,  Neck supple, trachea midline, no carotid bruits, no thyroidmegaly Lungs: Clear to auscultation, no rhonchi or wheezes, or rib retractions  Heart: Regular rate and rhythm, no murmurs or gallops Breast:Examined in sitting and supine position were symmetrical in appearance, no palpable masses or tenderness,  no skin retraction, no nipple inversion, no nipple discharge, no skin discoloration, no axillary or supraclavicular lymphadenopathy Abdomen: no palpable masses or tenderness, no rebound or guarding Extremities: no edema or skin discoloration or tenderness  Pelvic: Vulva: Normal             Vagina: No gross lesions or discharge  Cervix: No gross lesions or discharge.  Pap reflex done.  Uterus  AV, normal size, shape and consistency, non-tender and mobile  Adnexa  Without masses or tenderness  Anus: Normal   Assessment/Plan:  65 y.o. female for annual exam   1. Encounter for routine gynecological examination  with Papanicolaou smear of cervix Normal gynecologic exam.  Pap reflex done.  Breast exam normal.  Screening mammogram August 2022 was negative.  Colonoscopy in 2019.  Body mass index 22.33.  Continue with fitness and healthy nutrition.  We will do fasting lipid profile here today. - Lipid Profile - Cytology - PAP( [place])  2. Postmenopause Well on no hormone replacement therapy.  No postmenopausal bleeding.  Vitamin D supplements, calcium intake of 1.5 g/day total and regular weightbearing physical activities to continue.  3. Hashimoto's thyroiditis Hypothyroidism on Synthroid.  Thyroid panel done today.  Will adjust Synthroid dosage per results.  For now Synthroid 75 mcg 1  tablet daily prescription sent to pharmacy. - TSH  4. Sjogren's syndrome with other organ involvement ([place]) Followed with labs every 3 months.  Other orders - Calcium Carbonate-Vit D-Min (CALCIUM 1200 PO); Take by mouth. - SYNTHROID 75 MCG tablet; Take 1 tablet (75 mcg total) by mouth daily before breakfast.   Princess Bruins MD, 9:48 AM 08/23/2020

## 2020-08-24 LAB — LIPID PANEL
Cholesterol: 187 mg/dL (ref <200)
HDL: 60 mg/dL (ref >=50)
LDL Cholesterol (Calc): 111 mg/dL (calc) — ABNORMAL HIGH
Non-HDL Cholesterol (Calc): 127 mg/dL (calc) (ref <130)
Total CHOL/HDL Ratio: 3.1 (calc) (ref <5.0)
Triglycerides: 75 mg/dL (ref <150)

## 2020-08-24 LAB — CYTOLOGY - PAP: Diagnosis: NEGATIVE

## 2020-08-24 LAB — TSH: TSH: 1.36 mIU/L (ref 0.40–4.50)

## 2020-09-06 ENCOUNTER — Encounter: Payer: Self-pay | Admitting: *Deleted

## 2020-09-16 ENCOUNTER — Other Ambulatory Visit: Payer: Self-pay | Admitting: *Deleted

## 2020-09-16 DIAGNOSIS — Z79899 Other long term (current) drug therapy: Secondary | ICD-10-CM

## 2020-09-16 LAB — CBC WITH DIFFERENTIAL/PLATELET
Absolute Monocytes: 320 cells/uL (ref 200–950)
Basophils Absolute: 20 cells/uL (ref 0–200)
Basophils Relative: 0.5 %
Eosinophils Absolute: 12 cells/uL — ABNORMAL LOW (ref 15–500)
Eosinophils Relative: 0.3 %
HCT: 40.2 % (ref 35.0–45.0)
Hemoglobin: 13.3 g/dL (ref 11.7–15.5)
Lymphs Abs: 684 cells/uL — ABNORMAL LOW (ref 850–3900)
MCH: 29.2 pg (ref 27.0–33.0)
MCHC: 33.1 g/dL (ref 32.0–36.0)
MCV: 88.4 fL (ref 80.0–100.0)
MPV: 9.8 fL (ref 7.5–12.5)
Monocytes Relative: 8 %
Neutro Abs: 2964 cells/uL (ref 1500–7800)
Neutrophils Relative %: 74.1 %
Platelets: 242 10*3/uL (ref 140–400)
RBC: 4.55 10*6/uL (ref 3.80–5.10)
RDW: 12.6 % (ref 11.0–15.0)
Total Lymphocyte: 17.1 %
WBC: 4 10*3/uL (ref 3.8–10.8)

## 2020-09-16 LAB — COMPLETE METABOLIC PANEL WITH GFR
AG Ratio: 1.2 (calc) (ref 1.0–2.5)
ALT: 9 U/L (ref 6–29)
AST: 41 U/L — ABNORMAL HIGH (ref 10–35)
Albumin: 4.3 g/dL (ref 3.6–5.1)
Alkaline phosphatase (APISO): 65 U/L (ref 37–153)
BUN: 16 mg/dL (ref 7–25)
CO2: 26 mmol/L (ref 20–32)
Calcium: 9.4 mg/dL (ref 8.6–10.4)
Chloride: 103 mmol/L (ref 98–110)
Creat: 0.97 mg/dL (ref 0.50–1.05)
Globulin: 3.5 g/dL (calc) (ref 1.9–3.7)
Glucose, Bld: 82 mg/dL (ref 65–99)
Potassium: 4.3 mmol/L (ref 3.5–5.3)
Sodium: 135 mmol/L (ref 135–146)
Total Bilirubin: 1 mg/dL (ref 0.2–1.2)
Total Protein: 7.8 g/dL (ref 6.1–8.1)
eGFR: 65 mL/min/{1.73_m2} (ref >=60)

## 2020-09-17 NOTE — Progress Notes (Signed)
AST remains slightly elevated but stable. Rest of CMP WNL.  CBC stable.  No medication changes recommended at this time.

## 2020-11-01 ENCOUNTER — Other Ambulatory Visit (HOSPITAL_COMMUNITY): Payer: Self-pay

## 2020-11-01 MED ORDER — AMOXICILLIN 500 MG PO CAPS
ORAL_CAPSULE | ORAL | 1 refills | Status: DC
  Filled 2020-11-01: qty 21, 7d supply, fill #0

## 2020-11-15 ENCOUNTER — Telehealth: Payer: Self-pay

## 2020-11-15 NOTE — Telephone Encounter (Signed)
Patient is calling in wanting to know if Dr.Parker will be willing to accept her as a patient, husband is Lenord Carbo and he is a current patient.

## 2020-11-15 NOTE — Telephone Encounter (Signed)
Yes that is ok.  Jennifer Barr. [name], MD 11/15/2020 10:02 AM

## 2020-11-15 NOTE — Telephone Encounter (Signed)
Ok to schedule as new patient

## 2020-11-16 NOTE — Telephone Encounter (Signed)
Patient is scheduled   

## 2020-12-14 ENCOUNTER — Other Ambulatory Visit: Payer: Self-pay

## 2020-12-14 ENCOUNTER — Ambulatory Visit (INDEPENDENT_AMBULATORY_CARE_PROVIDER_SITE_OTHER): Payer: 59 | Admitting: Family Medicine

## 2020-12-14 ENCOUNTER — Encounter: Payer: Self-pay | Admitting: Family Medicine

## 2020-12-14 VITALS — BP 108/66 | HR 74 | Temp 97.7°F | Ht 68.5 in | Wt 149.2 lb

## 2020-12-14 DIAGNOSIS — M3500 Sicca syndrome, unspecified: Secondary | ICD-10-CM

## 2020-12-14 DIAGNOSIS — E063 Autoimmune thyroiditis: Secondary | ICD-10-CM | POA: Diagnosis not present

## 2020-12-14 DIAGNOSIS — I7300 Raynaud's syndrome without gangrene: Secondary | ICD-10-CM | POA: Diagnosis not present

## 2020-12-14 DIAGNOSIS — G6289 Other specified polyneuropathies: Secondary | ICD-10-CM

## 2020-12-14 DIAGNOSIS — G629 Polyneuropathy, unspecified: Secondary | ICD-10-CM | POA: Insufficient documentation

## 2020-12-14 DIAGNOSIS — I776 Arteritis, unspecified: Secondary | ICD-10-CM

## 2020-12-14 NOTE — Assessment & Plan Note (Signed)
On Synthroid 75 mcg daily.  Last TSH at goal.  We will continue current regimen.

## 2020-12-14 NOTE — Assessment & Plan Note (Addendum)
No red flags.  She did have positive bilateral straight leg raise today and may have some element of sciatica.  Recently had TSH and other labs done which were normal.  She does have underlying vasculitis which is likely contributing as well.  Discussed further investigation including labs and possible referral to neurology however patient deferred.  We will continue with watchful waiting.  She may benefit from nerve conduction study if this continues to be an issue.  Discussed reasons to return tocare.

## 2020-12-14 NOTE — Patient Instructions (Signed)
It was very nice to see you today!  I am glad that you are doing well.  No medication changes today.  We can give your pneumonia vaccine next year.  Please let me know if the numbness/tingling in your feet worsens.  We will see back in year for your annual checkup.  Please come back sooner if needed.  Take care, Dr [name]  PLEASE NOTE:  If you had any lab tests please let us know if you have not heard back within a few days. You may see your results on mychart before we have a chance to review them but we will give you a call once they are reviewed by [place]. If we ordered any referrals today, please let us know if you have not heard from their office within the next week.   Please try these tips to maintain a healthy lifestyle:  Eat at least 3 REAL meals and 1-2 snacks per day.  Aim for no more than 5 hours between eating.  If you eat breakfast, please do so within one hour of getting up.   Each meal should contain half fruits/vegetables, one quarter protein, and one quarter carbs (no bigger than a computer mouse)  Cut down on sweet beverages. This includes juice, soda, and sweet tea.   Drink at least 1 glass of water with each meal and aim for at least 8 glasses per day  Exercise at least 150 minutes every week.

## 2020-12-14 NOTE — Progress Notes (Signed)
Jennifer Barr is a 65 y.o. female who presents today for an office visit.  Assessment/Plan:  Chronic Problems Addressed Today: Hashimoto's thyroiditis On Synthroid 75 mcg daily.  Last TSH at goal.  We will continue current regimen.  Vasculitis Follows with rheumatology.  She is having some peripheral neuropathy which may be due to sciatica however overall symptoms are relatively well controlled on her current regimen of azathioprine 50 mg daily.  Sjogren's disease ([place]) Follows with rheumatology.  Symptoms are stable.  She is on azathioprine 50 mg daily and pilocarpine 5 mg 3 times daily.  Peripheral neuropathy No red flags.  She did have positive bilateral straight leg raise today and may have some element of sciatica.  Recently had TSH and other labs done which were normal.  She does have underlying vasculitis which is likely contributing as well.  Discussed further investigation including labs and possible referral to neurology however patient deferred.  We will continue with watchful waiting.  She may benefit from nerve conduction study if this continues to be an issue.  Discussed reasons to return tocare.   Raynaud's syndrome without gangrene Symptoms are stable.  Recommended cold avoidance.  Discussed warning signs and reasons to return to care.  We will give pneumonia vaccine when she is 48.  She is up-to-date on other vaccines and screenings.    Subjective:  HPI:  She is a new patient. See A/pr for status of chronic conditions.   She have a history of Sjogren's diease and vasculitis. She reports she still have dry eyes and dry mouth. She denies stiffness in her joints. She denies any recurrence of vasculitis rash. Denies back pain or shortness of breath.  She also complain of falling asleep sensation in her feet. This started about a year ago. Associated symptoms are tingling and numbness.  Worse with certain activities such as sitting for long periods of time.  No obvious  alleviating factors.  Symptoms are equal bilaterally.  No pain.  PMH:  The following were reviewed and entered/updated in epic: Past Medical History:  Diagnosis Date   Osteopenia 07/2017   T score -1.3 FRAX 7.2% / 0.6%   Premature ovarian failure    Sjoegren syndrome    Dr. Estanislado Pandy   Thyroid disease    Hashiomto   Vasculitis ([place])    Dr. Estanislado Pandy   Patient Active Problem List   Diagnosis Date Noted   Peripheral neuropathy 12/14/2020   ANA positive 02/10/2016   Rheumatoid factor positive 02/10/2016   Primary osteoarthritis, right hand 02/10/2016   Scoliosis 02/10/2016   Raynaud's syndrome without gangrene 02/10/2016   Sjogren's disease ([place]) 05/07/2014   Vasculitis ([place]) 05/07/2014   Hashimoto's thyroiditis 05/07/2014   History reviewed. No pertinent surgical history.  Family History  Problem Relation Age of Onset   Diabetes Mother    Diabetes Father    Scleroderma Daughter     Medications- reviewed and updated Current Outpatient Medications  Medication Sig Dispense Refill   Ascorbic Acid (VITAMIN C PO) Take by mouth daily.     aspirin 81 MG tablet Take 81 mg by mouth daily.     azaTHIOprine (IMURAN) 50 MG tablet Take 1 tablet (50 mg total) by mouth daily. 90 tablet 0   B Complex Vitamins (VITAMIN B COMPLEX PO) Take by mouth daily.     Calcium Carbonate-Vit D-Min (CALCIUM 1200 PO) Take by mouth.     MAGNESIUM PO Take by mouth daily.     pilocarpine (SALAGEN) 5  MG tablet Take 1 tablet (5 mg total) by mouth 3 (three) times daily. 270 tablet 1   SYNTHROID 75 MCG tablet Take 1 tablet (75 mcg total) by mouth daily before breakfast. 90 tablet 4   VITAMIN D PO Take by mouth daily.     No current facility-administered medications for this visit.    Allergies-reviewed and updated Allergies  Allergen Reactions   Sulfa Antibiotics     Social History   Socioeconomic History   Marital status: Married    Spouse name: Not on file   Number of children: Not on file    Years of education: Not on file   Highest education level: Not on file  Occupational History   Not on file  Tobacco Use   Smoking status: Never   Smokeless tobacco: Never  Vaping Use   Vaping Use: Never used  Substance and Sexual Activity   Alcohol use: Not Currently   Drug use: Never   Sexual activity: Yes    Partners: Male    Comment: 1st itnercourse-18, partners- 41, married- 21 yrs   Other Topics Concern   Not on file  Social History Narrative   Not on file   Social Determinants of Health   Financial Resource Strain: Not on file  Food Insecurity: Not on file  Transportation Needs: Not on file  Physical Activity: Not on file  Stress: Not on file  Social Connections: Not on file           Objective:  Physical Exam: BP 108/66   Pulse 74   Temp 97.7 F (36.5 C) (Temporal)   Ht 5' 8.5" (1.74 m)   Wt 149 lb 3.2 oz (67.7 kg)   SpO2 98%   BMI 22.36 kg/m   Gen: No acute distress, resting comfortably CV: Regular rate and rhythm with no murmurs appreciated Pulm: Normal work of breathing, clear to auscultation bilaterally with no crackles, wheezes, or rhonchi MSK: Back without deformities.  Lower extremities without deformities.  Negative Tinel's sign at bilateral medial malleolus.  Neurovascular intact distally.  Straight leg raise positive bilaterally. Neuro: Grossly normal, moves all extremities Psych: Normal affect and thought content       I,Savera Zaman,acting as a scribe for Dimas Chyle, MD.,have documented all relevant documentation on the behalf of Dimas Chyle, MD,as directed by  Dimas Chyle, MD while in the presence of Dimas Chyle, MD.   I, Dimas Chyle, MD, have reviewed all documentation for this visit. The documentation on 12/14/20 for the exam, diagnosis, procedures, and orders are all accurate and complete.  Algis Greenhouse. [name], MD 12/14/2020 2:17 PM

## 2020-12-14 NOTE — Assessment & Plan Note (Signed)
Follows with rheumatology.  She is having some peripheral neuropathy which may be due to sciatica however overall symptoms are relatively well controlled on her current regimen of azathioprine 50 mg daily.

## 2020-12-14 NOTE — Assessment & Plan Note (Signed)
Symptoms are stable.  Recommended cold avoidance.  Discussed warning signs and reasons to return to care.

## 2020-12-14 NOTE — Assessment & Plan Note (Signed)
Follows with rheumatology.  Symptoms are stable.  She is on azathioprine 50 mg daily and pilocarpine 5 mg 3 times daily.

## 2020-12-16 ENCOUNTER — Other Ambulatory Visit: Payer: Self-pay | Admitting: *Deleted

## 2020-12-16 DIAGNOSIS — Z79899 Other long term (current) drug therapy: Secondary | ICD-10-CM

## 2020-12-17 LAB — COMPLETE METABOLIC PANEL WITH GFR
AG Ratio: 1 (calc) (ref 1.0–2.5)
ALT: 11 U/L (ref 6–29)
AST: 41 U/L — ABNORMAL HIGH (ref 10–35)
Albumin: 4.1 g/dL (ref 3.6–5.1)
Alkaline phosphatase (APISO): 71 U/L (ref 37–153)
BUN: 18 mg/dL (ref 7–25)
CO2: 23 mmol/L (ref 20–32)
Calcium: 9.4 mg/dL (ref 8.6–10.4)
Chloride: 102 mmol/L (ref 98–110)
Creat: 0.97 mg/dL (ref 0.50–1.05)
Globulin: 4 g/dL (calc) — ABNORMAL HIGH (ref 1.9–3.7)
Glucose, Bld: 90 mg/dL (ref 65–99)
Potassium: 4.4 mmol/L (ref 3.5–5.3)
Sodium: 134 mmol/L — ABNORMAL LOW (ref 135–146)
Total Bilirubin: 0.8 mg/dL (ref 0.2–1.2)
Total Protein: 8.1 g/dL (ref 6.1–8.1)
eGFR: 65 mL/min/{1.73_m2} (ref >=60)

## 2020-12-17 LAB — CBC WITH DIFFERENTIAL/PLATELET
Absolute Monocytes: 307 cells/uL (ref 200–950)
Basophils Absolute: 20 cells/uL (ref 0–200)
Basophils Relative: 0.6 %
Eosinophils Absolute: 0 cells/uL — ABNORMAL LOW (ref 15–500)
Eosinophils Relative: 0 %
HCT: 39.3 % (ref 35.0–45.0)
Hemoglobin: 12.9 g/dL (ref 11.7–15.5)
Lymphs Abs: 594 cells/uL — ABNORMAL LOW (ref 850–3900)
MCH: 29.1 pg (ref 27.0–33.0)
MCHC: 32.8 g/dL (ref 32.0–36.0)
MCV: 88.5 fL (ref 80.0–100.0)
MPV: 9.5 fL (ref 7.5–12.5)
Monocytes Relative: 9.3 %
Neutro Abs: 2379 cells/uL (ref 1500–7800)
Neutrophils Relative %: 72.1 %
Platelets: 227 10*3/uL (ref 140–400)
RBC: 4.44 10*6/uL (ref 3.80–5.10)
RDW: 12.8 % (ref 11.0–15.0)
Total Lymphocyte: 18 %
WBC: 3.3 10*3/uL — ABNORMAL LOW (ref 3.8–10.8)

## 2020-12-17 NOTE — Progress Notes (Signed)
WBCs and lymphocyte count are low due to immunosuppressive therapy.  AST is mildly elevated and stable.  Sodium is mildly decreased.

## 2020-12-24 ENCOUNTER — Telehealth: Payer: Self-pay

## 2020-12-24 NOTE — Telephone Encounter (Signed)
I spoke with Hazel Sams, PA-C and patient should hold Imuran for 1-2 weeks after symptoms resolve.   I called patient and advised. Patient verbalized understanding.

## 2020-12-24 NOTE — Telephone Encounter (Signed)
Patient called stating she was just diagnosed with Covid and requested a return call regarding her medications.

## 2021-02-03 NOTE — Progress Notes (Signed)
Office Visit Note  Patient: Jennifer Barr             Date of Birth: [birth year]           MRN: [id number]             PCP: [name], MD Referring: Alycia Rossetti, MD Visit Date: 02/15/2021 Occupation: @GUAROCC @  Subjective:  Medication management  History of Present Illness: Jennifer Barr is a 66 y.o. female with history of Sjogren's and vasculitis.  She continues to have dry mouth and dry eyes symptoms.  She states she takes pilocarpine only once a day which helps her to some extent.  She has been using contacts to lock moisture which helps with dry eyes.  She has not had an episode of vasculitis since she retired.  She denies any history of joint pain or joint swelling.  Patient states she developed COVID-19 infection in December 2022.  She was off Imuran for about 3 weeks.  She has been having  cough and shortness of breath since then.  Activities of Daily Living:  Patient reports morning stiffness for 0 minutes.   Patient Denies nocturnal pain.  Difficulty dressing/grooming: Denies Difficulty climbing stairs: Denies Difficulty getting out of chair: Denies Difficulty using hands for taps, buttons, cutlery, and/or writing: Denies  Review of Systems  Constitutional:  Positive for fatigue.  HENT:  Negative for mouth sores, mouth dryness and nose dryness.   Eyes:  Positive for pain, itching and dryness.  Respiratory:  Positive for shortness of breath. Negative for difficulty breathing.   Cardiovascular:  Negative for chest pain and palpitations.  Gastrointestinal:  Negative for blood in stool, constipation and diarrhea.  Endocrine: Negative for increased urination.  Genitourinary:  Negative for difficulty urinating.  Musculoskeletal:  Negative for joint pain, joint pain, joint swelling, myalgias, morning stiffness, muscle tenderness and myalgias.  Skin:  Negative for color change, rash and redness.  Allergic/Immunologic: Negative for susceptible to infections.   Neurological:  Negative for dizziness, numbness, headaches, memory loss and weakness.  Hematological:  Negative for bruising/bleeding tendency.  Psychiatric/Behavioral:  Negative for confusion.    PMFS History:  Patient Active Problem List   Diagnosis Date Noted   Peripheral neuropathy 12/14/2020   ANA positive 02/10/2016   Rheumatoid factor positive 02/10/2016   Primary osteoarthritis, right hand 02/10/2016   Scoliosis 02/10/2016   Raynaud's syndrome without gangrene 02/10/2016   Sjogren's disease ([place]) 05/07/2014   Vasculitis ([place]) 05/07/2014   Hashimoto's thyroiditis 05/07/2014    Past Medical History:  Diagnosis Date   Osteopenia 07/2017   T score -1.3 FRAX 7.2% / 0.6%   Premature ovarian failure    Sjoegren syndrome    Dr. Estanislado Pandy   Thyroid disease    Hashiomto   Vasculitis ([place])    Dr. Estanislado Pandy    Family History  Problem Relation Age of Onset   Diabetes Mother    Diabetes Father    Scleroderma Daughter    History reviewed. No pertinent surgical history. Social History   Social History Narrative   Not on file   Immunization History  Administered Date(s) Administered   Influenza,inj,Quad PF,6+ Mos 10/14/2018   Moderna Sars-Covid-2 Vaccination 03/25/2019, 04/22/2019, 09/01/2019   Tdap 06/06/2018     Objective: Vital Signs: BP 105/70 (BP Location: Left Arm, Patient Position: Sitting, Cuff Size: Normal)    Pulse 83    Ht 5' 8.5" (1.74 m)    Wt 149 lb (67.6 kg)  BMI 22.33 kg/m    Physical Exam Vitals and nursing note reviewed.  Constitutional:      Appearance: She is well-developed.  HENT:     Head: Normocephalic and atraumatic.  Eyes:     Conjunctiva/sclera: Conjunctivae normal.  Cardiovascular:     Rate and Rhythm: Normal rate and regular rhythm.     Heart sounds: Normal heart sounds.  Pulmonary:     Effort: Pulmonary effort is normal.     Breath sounds: Normal breath sounds.  Abdominal:     General: Bowel sounds are normal.     Palpations:  Abdomen is soft.  Musculoskeletal:     Cervical back: Normal range of motion.  Lymphadenopathy:     Cervical: No cervical adenopathy.  Skin:    General: Skin is warm and dry.     Capillary Refill: Capillary refill takes 2 to 3 seconds.     Comments: No nailbed capillary changes or sclerodactyly was noted.  No digital ulcers were noted.  Her hands were warm to touch.  Neurological:     Mental Status: She is alert and oriented to person, place, and time.  Psychiatric:        Behavior: Behavior normal.     Musculoskeletal Exam: C-spine was in good range of motion.  Shoulder joints, elbow joints, wrist joints, MCPs PIPs and DIPs with good range of motion with no synovitis.  Hip joints, knee joints, ankles, MTPs and PIPs with good range of motion with no synovitis.  CDAI Exam: CDAI Score: -- Patient Global: --; Provider Global: -- Swollen: --; Tender: -- Joint Exam 02/15/2021   No joint exam has been documented for this visit   There is currently no information documented on the homunculus. Go to the Rheumatology activity and complete the homunculus joint exam.  Investigation: No additional findings.  Imaging: No results found.  Recent Labs: Lab Results  Component Value Date   WBC 3.3 (L) 12/16/2020   HGB 12.9 12/16/2020   PLT 227 12/16/2020   NA 134 (L) 12/16/2020   K 4.4 12/16/2020   CL 102 12/16/2020   CO2 23 12/16/2020   GLUCOSE 90 12/16/2020   BUN 18 12/16/2020   CREATININE 0.97 12/16/2020   BILITOT 0.8 12/16/2020   ALKPHOS 81 09/01/2019   AST 41 (H) 12/16/2020   ALT 11 12/16/2020   PROT 8.1 12/16/2020   ALBUMIN 4.3 09/01/2019   CALCIUM 9.4 12/16/2020   GFRAA 79 06/17/2020    Speciality Comments: PLQ Eye Exam: 04/22/2019 WNL @ [facility] Otter, OD PA  Procedures:  No procedures performed Allergies: Sulfa antibiotics   Assessment / Plan:     Visit Diagnoses: Sjogren's syndrome with other organ involvement (HCC)-positive ANA, positive RF, positive Ro, positive  La.  Rheumatoid factor was negative and SPEP was normal in August 2020.Marland Kitchen  She has severe sicca symptoms with dry mouth and dry eyes.  She has been using pilocarpine once a day and Imuran 50 mg daily.  Symptoms are manageable with over-the-counter products for patient.  I offered getting serology today which she declined.  She is uncertain about the insurance changes she will be switching to Medicare.  Prescription refill for pilocarpine and Imuran will be sent today.  Side effects of both medications were reviewed.  High risk medication use - Imuran 50 mg by mouth daily. (PLQ was discontinued and June due to lymphopenia).  Labs obtained on December 16, 2020 were reviewed.  She had lymphopenia and low white cell count.  Her  AST was mildly elevated and stable.  We will continue to monitor labs.  Vasculitis ([place]) - No recurrence since July 2020.  Patient has not noticed any recurrence of vasculitis since she retired.  Raynaud's syndrome without gangrene-she continues to have Raynaud's symptoms.  She states the symptoms are mild.  She denies any digital ulcers.  Lymphopenia - she was evaluated by Dr.Kale in the past and was advised that lymphopenia is related to medications and autoimmune disease.  Primary osteoarthritis of both hands-currently not symptomatic.  Scoliosis  History of hypothyroidism  Osteopenia of multiple sites - July 11, 2017 DEXA scan showed osteopenia with T score -1.3 and right femoral neck.  She goes to the gym and exercises on a regular basis.  She will get repeat DEXA scan through her GYN.  Calcium and vitamin D intake was discussed.  COVID-19 virus infection - December 2022.  According to the patient she stopped Imuran for 3 weeks for COVID-19 virus infection.  She still has some residual cough and shortness of breath.  Advised her to schedule an appointment with her PCP if she has ongoing symptoms.  Orders: No orders of the defined types were placed in this encounter.  Meds  ordered this encounter  Medications   azaTHIOprine (IMURAN) 50 MG tablet    Sig: Take 1 tablet (50 mg total) by mouth daily.    Dispense:  90 tablet    Refill:  0   pilocarpine (SALAGEN) 5 MG tablet    Sig: Take 1 tablet (5 mg total) by mouth 3 (three) times daily.    Dispense:  270 tablet    Refill:  1     Follow-Up Instructions: Return in about 6 months (around 08/15/2021) for Sjogrens, Vasculitis.   Bo Merino, MD  Note - This record has been created using Editor, commissioning.  Chart creation errors have been sought, but may not always  have been located. Such creation errors do not reflect on  the standard of medical care.

## 2021-02-15 ENCOUNTER — Other Ambulatory Visit (HOSPITAL_COMMUNITY): Payer: Self-pay

## 2021-02-15 ENCOUNTER — Ambulatory Visit (INDEPENDENT_AMBULATORY_CARE_PROVIDER_SITE_OTHER): Payer: 59 | Admitting: Rheumatology

## 2021-02-15 ENCOUNTER — Other Ambulatory Visit: Payer: Self-pay

## 2021-02-15 ENCOUNTER — Encounter: Payer: Self-pay | Admitting: Rheumatology

## 2021-02-15 VITALS — BP 105/70 | HR 83 | Ht 68.5 in | Wt 149.0 lb

## 2021-02-15 DIAGNOSIS — Z79899 Other long term (current) drug therapy: Secondary | ICD-10-CM

## 2021-02-15 DIAGNOSIS — U071 COVID-19: Secondary | ICD-10-CM

## 2021-02-15 DIAGNOSIS — M8589 Other specified disorders of bone density and structure, multiple sites: Secondary | ICD-10-CM

## 2021-02-15 DIAGNOSIS — M3509 Sicca syndrome with other organ involvement: Secondary | ICD-10-CM

## 2021-02-15 DIAGNOSIS — M419 Scoliosis, unspecified: Secondary | ICD-10-CM

## 2021-02-15 DIAGNOSIS — M19041 Primary osteoarthritis, right hand: Secondary | ICD-10-CM

## 2021-02-15 DIAGNOSIS — I7300 Raynaud's syndrome without gangrene: Secondary | ICD-10-CM | POA: Diagnosis not present

## 2021-02-15 DIAGNOSIS — I776 Arteritis, unspecified: Secondary | ICD-10-CM

## 2021-02-15 DIAGNOSIS — M19042 Primary osteoarthritis, left hand: Secondary | ICD-10-CM

## 2021-02-15 DIAGNOSIS — Z8639 Personal history of other endocrine, nutritional and metabolic disease: Secondary | ICD-10-CM

## 2021-02-15 DIAGNOSIS — D72810 Lymphocytopenia: Secondary | ICD-10-CM

## 2021-02-15 MED ORDER — PILOCARPINE HCL 5 MG PO TABS
5.0000 mg | ORAL_TABLET | Freq: Three times a day (TID) | ORAL | 1 refills | Status: DC
  Filled 2021-02-15 – 2021-03-18 (×2): qty 270, 90d supply, fill #0

## 2021-02-15 MED ORDER — AZATHIOPRINE 50 MG PO TABS
50.0000 mg | ORAL_TABLET | Freq: Every day | ORAL | 0 refills | Status: DC
  Filled 2021-02-15 – 2021-03-23 (×3): qty 90, 90d supply, fill #0

## 2021-02-15 NOTE — Patient Instructions (Signed)
Standing Labs We placed an order today for your standing lab work.   Please have your standing labs drawn in March and every 3 months  If possible, please have your labs drawn 2 weeks prior to your appointment so that the provider can discuss your results at your appointment.  Please note that you may see your imaging and lab results in [place] before we have reviewed them. We may be awaiting multiple results to interpret others before contacting you. Please allow our office up to 72 hours to thoroughly review all of the results before contacting the office for clarification of your results.  We have open lab daily: Monday through Thursday from 1:30-4:30 PM and Friday from 1:30-4:00 PM at the office of Dr. Bo Merino, [facility].   Please be advised, all patients with office appointments requiring lab work will take precedent over walk-in lab work.  If possible, please come for your lab work on Monday and Friday afternoons, as you may experience shorter wait times. The office is located at [address], [place], [place], [place] [address] No appointment is necessary.   Labs are drawn by [facility]. Please bring your co-pay at the time of your lab draw.  You may receive a bill from [facility] for your lab work.  Please note if you are on Hydroxychloroquine and and an order has been placed for a Hydroxychloroquine level, you will need to have it drawn 4 hours or more after your last dose.  If you wish to have your labs drawn at another location, please call the office 24 hours in advance to send orders.  If you have any questions regarding directions or hours of operation,  please call [phone].   As a reminder, please drink plenty of water prior to coming for your lab work. Thanks!   Vaccines You are taking a medication(s) that can suppress your immune system.  The following immunizations are recommended: Flu annually Covid-19  Td/Tdap (tetanus, diphtheria, pertussis)  every 10 years Pneumonia (Prevnar 15 then Pneumovax 23 at least 1 year apart.  Alternatively, can take Prevnar 20 without needing additional dose) Shingrix: 2 doses from 4 weeks to 6 months apart  Please check with your PCP to make sure you are up to date.   Heart Disease Prevention   Your inflammatory disease increases your risk of heart disease which includes heart attack, stroke, atrial fibrillation (irregular heartbeats), high blood pressure, heart failure and atherosclerosis (plaque in the arteries).  It is important to reduce your risk by:   Keep blood pressure, cholesterol, and blood sugar at healthy levels   Smoking Cessation   Maintain a healthy weight  BMI 20-25   Eat a healthy diet  Plenty of fresh fruit, vegetables, and whole grains  Limit saturated fats, foods high in sodium, and added sugars  DASH and Mediterranean diet   Increase physical activity  Recommend moderate physically activity for 150 minutes per week/ 30 minutes a day for five days a week These can be broken up into three separate ten-minute sessions during the day.   Reduce Stress  Meditation, slow breathing exercises, yoga, coloring books  Dental visits twice a year

## 2021-02-23 ENCOUNTER — Other Ambulatory Visit (HOSPITAL_COMMUNITY): Payer: Self-pay

## 2021-02-23 MED ORDER — AMOXICILLIN-POT CLAVULANATE 875-125 MG PO TABS
1.0000 | ORAL_TABLET | Freq: Two times a day (BID) | ORAL | 0 refills | Status: DC
  Filled 2021-02-23: qty 14, 7d supply, fill #0

## 2021-03-01 ENCOUNTER — Ambulatory Visit (INDEPENDENT_AMBULATORY_CARE_PROVIDER_SITE_OTHER): Payer: 59

## 2021-03-01 ENCOUNTER — Other Ambulatory Visit: Payer: Self-pay

## 2021-03-01 DIAGNOSIS — Z23 Encounter for immunization: Secondary | ICD-10-CM | POA: Diagnosis not present

## 2021-03-01 NOTE — Progress Notes (Signed)
Pt tolerated 2nd shingrix well.

## 2021-03-15 ENCOUNTER — Other Ambulatory Visit: Payer: Self-pay | Admitting: *Deleted

## 2021-03-15 DIAGNOSIS — M3509 Sicca syndrome with other organ involvement: Secondary | ICD-10-CM | POA: Diagnosis not present

## 2021-03-15 DIAGNOSIS — Z79899 Other long term (current) drug therapy: Secondary | ICD-10-CM | POA: Diagnosis not present

## 2021-03-16 LAB — COMPLETE METABOLIC PANEL WITH GFR
AG Ratio: 1.2 (calc) (ref 1.0–2.5)
ALT: 9 U/L (ref 6–29)
AST: 35 U/L (ref 10–35)
Albumin: 4.4 g/dL (ref 3.6–5.1)
Alkaline phosphatase (APISO): 72 U/L (ref 37–153)
BUN: 13 mg/dL (ref 7–25)
CO2: 25 mmol/L (ref 20–32)
Calcium: 9.3 mg/dL (ref 8.6–10.4)
Chloride: 101 mmol/L (ref 98–110)
Creat: 0.97 mg/dL (ref 0.50–1.05)
Globulin: 3.7 g/dL (calc) (ref 1.9–3.7)
Glucose, Bld: 91 mg/dL (ref 65–99)
Potassium: 4.4 mmol/L (ref 3.5–5.3)
Sodium: 135 mmol/L (ref 135–146)
Total Bilirubin: 0.8 mg/dL (ref 0.2–1.2)
Total Protein: 8.1 g/dL (ref 6.1–8.1)
eGFR: 65 mL/min/{1.73_m2} (ref >=60)

## 2021-03-16 LAB — URINALYSIS, ROUTINE W REFLEX MICROSCOPIC
Bilirubin Urine: NEGATIVE
Glucose, UA: NEGATIVE
Hgb urine dipstick: NEGATIVE
Ketones, ur: NEGATIVE
Leukocytes,Ua: NEGATIVE
Nitrite: NEGATIVE
Protein, ur: NEGATIVE
Specific Gravity, Urine: 1.016 (ref 1.001–1.035)
pH: 7 (ref 5.0–8.0)

## 2021-03-16 LAB — CBC WITH DIFFERENTIAL/PLATELET
Absolute Monocytes: 341 cells/uL (ref 200–950)
Basophils Absolute: 19 cells/uL (ref 0–200)
Basophils Relative: 0.4 %
Eosinophils Absolute: 10 cells/uL — ABNORMAL LOW (ref 15–500)
Eosinophils Relative: 0.2 %
HCT: 40.3 % (ref 35.0–45.0)
Hemoglobin: 13.2 g/dL (ref 11.7–15.5)
Lymphs Abs: 624 cells/uL — ABNORMAL LOW (ref 850–3900)
MCH: 28.9 pg (ref 27.0–33.0)
MCHC: 32.8 g/dL (ref 32.0–36.0)
MCV: 88.4 fL (ref 80.0–100.0)
MPV: 10 fL (ref 7.5–12.5)
Monocytes Relative: 7.1 %
Neutro Abs: 3806 cells/uL (ref 1500–7800)
Neutrophils Relative %: 79.3 %
Platelets: 278 10*3/uL (ref 140–400)
RBC: 4.56 10*6/uL (ref 3.80–5.10)
RDW: 12.7 % (ref 11.0–15.0)
Total Lymphocyte: 13 %
WBC: 4.8 10*3/uL (ref 3.8–10.8)

## 2021-03-16 NOTE — Progress Notes (Signed)
CBC is stable.  Lymphocyte count is low due to Imuran use.  CMP is normal and UA is negative.

## 2021-03-18 ENCOUNTER — Other Ambulatory Visit (HOSPITAL_COMMUNITY): Payer: Self-pay

## 2021-03-18 ENCOUNTER — Telehealth: Payer: Self-pay | Admitting: *Deleted

## 2021-03-18 ENCOUNTER — Telehealth: Payer: Self-pay

## 2021-03-18 ENCOUNTER — Telehealth: Payer: Self-pay | Admitting: Rheumatology

## 2021-03-18 MED ORDER — SYNTHROID 75 MCG PO TABS
75.0000 ug | ORAL_TABLET | Freq: Every day | ORAL | 1 refills | Status: DC
  Filled 2021-03-18: qty 90, 90d supply, fill #0
  Filled 2021-07-04: qty 90, 90d supply, fill #1

## 2021-03-18 NOTE — Telephone Encounter (Signed)
Pharmacy returned the call and a PA is required for Imuran. I have completed a PA via [email or web address] ?

## 2021-03-18 NOTE — Telephone Encounter (Signed)
Patient called stating she was returning Marissa's call.  Patient states her new insurance [employer] allows her to pick up a 3 month supply and requested it be sent to the pharmacy.  Patient was told a 3 month supply for Pilocarpine and Azathioprine was sent to [facility] on 02/15/21.  Patient states she needs the prescriptions resent.   ?

## 2021-03-18 NOTE — Telephone Encounter (Signed)
Patient called the office requesting a refill of Azathioprine '50mg'$  and Pilocarpine '5mg'$  to be sent to the [facility]. Patient requests a 3 month supply of both medications if possible.  ?

## 2021-03-18 NOTE — Telephone Encounter (Signed)
Refills of imuran and pilocarpine were sent to the pharmacy on 02/15/2021 (both with fills remaining). I called the patient and left a message on machine to advise patient to contact the pharmacy.  ?

## 2021-03-18 NOTE — Telephone Encounter (Signed)
Patient called requesting 90 day supply of brand synthroid 75 mcg tablet sent to [facility]. Rx sent.  ? ?Just FYI  ?

## 2021-03-18 NOTE — Telephone Encounter (Signed)
I just spoke with [facility] and they have 90 day supply of both prescriptions on file and patient's new insurance information. They are getting prescriptions ready now. I called patient and advised.  ?

## 2021-03-21 NOTE — Telephone Encounter (Signed)
Prior authorization approved from 03/18/2021 to 03/19/2022.  ?

## 2021-03-23 ENCOUNTER — Other Ambulatory Visit (HOSPITAL_COMMUNITY): Payer: Self-pay

## 2021-04-13 ENCOUNTER — Telehealth (INDEPENDENT_AMBULATORY_CARE_PROVIDER_SITE_OTHER): Payer: HMO | Admitting: Family Medicine

## 2021-04-13 ENCOUNTER — Encounter: Payer: Self-pay | Admitting: Family Medicine

## 2021-04-13 DIAGNOSIS — B309 Viral conjunctivitis, unspecified: Secondary | ICD-10-CM

## 2021-04-13 MED ORDER — OFLOXACIN 0.3 % OP SOLN: 1.0000 [drp] | Freq: Four times a day (QID) | OPHTHALMIC | 0 refills | Status: DC

## 2021-04-13 NOTE — Patient Instructions (Signed)
Meds sent to pharmacy ?Warm compresses if needed ?Don't wear contact lenses.  ?Use drops 5-7 days.  ?

## 2021-04-13 NOTE — Progress Notes (Signed)
? ? ?MyChart Video Visit ? ? ? ?Virtual Visit via Video Note  ? ?This visit type was conducted due to national recommendations for restrictions regarding the COVID-19 Pandemic (e.g. social distancing) in an effort to limit this patient's exposure and mitigate transmission in our community. This patient is at least at moderate risk for complications without adequate follow up. This format is felt to be most appropriate for this patient at this time. Physical exam was limited by quality of the video and audio technology used for the visit. CMA was able to get the patient set up on a video visit. ? ?Patient location: Home. Patient and provider in visit ?Provider location: Office ? ?I discussed the limitations of evaluation and management by telemedicine and the availability of in person appointments. The patient expressed understanding and agreed to proceed. ? ?Visit Date: 04/13/2021 ? ?Today's healthcare provider: Wellington Hampshire, MD  ? ? ? ?Subjective:  ? ? Patient ID: Jennifer Barr, female    DOB: 23-May-1955, 66 y.o.   MRN: [id number] ? ?Chief Complaint  ?Patient presents with  ? Conjunctivitis  ?  Right eye, possibly starting in left ?Grandson she watched yesterday has it ?Pain, redness and drainage in right eye  ? ? ?HPI ?Conjunctivitis-R eye and poss going to L.  Watched grandson who has chronic eye drainage(he's 60 mo old and "will grow out of it")  ?Irrit and redness and d/c in R eye.  No major visual changes-just blurry ?Has sjogren's as well. ? ? ?Past Medical History:  ?Diagnosis Date  ? Osteopenia 07/2017  ? T score -1.3 FRAX 7.2% / 0.6%  ? Premature ovarian failure   ? Sjoegren syndrome   ? Dr. Estanislado Pandy  ? Thyroid disease   ? Hashiomto  ? Vasculitis ([place])   ? Dr. Estanislado Pandy  ? ? ?History reviewed. No pertinent surgical history. ? ?Outpatient Medications Prior to Visit  ?Medication Sig Dispense Refill  ? Ascorbic Acid (VITAMIN C PO) Take by mouth daily.    ? aspirin 81 MG tablet Take 81 mg by mouth daily.    ?  azaTHIOprine (IMURAN) 50 MG tablet Take 1 tablet (50 mg total) by mouth daily. 90 tablet 0  ? B Complex Vitamins (VITAMIN B COMPLEX PO) Take by mouth daily.    ? Calcium Carbonate-Vit D-Min (CALCIUM 1200 PO) Take by mouth.    ? MAGNESIUM PO Take by mouth daily.    ? SYNTHROID 75 MCG tablet Take 1 tablet (75 mcg total) by mouth daily before breakfast. 90 tablet 1  ? VITAMIN D PO Take by mouth daily.    ? pilocarpine (SALAGEN) 5 MG tablet Take 1 tablet (5 mg total) by mouth 3 (three) times daily. (Patient not taking: Reported on 04/13/2021) 270 tablet 1  ? amoxicillin-clavulanate (AUGMENTIN) 875-125 MG tablet Take 1 tablet by mouth 2 (two) times daily until all gone (Patient not taking: Reported on 04/13/2021) 14 tablet 0  ? ?No facility-administered medications prior to visit.  ? ? ?Allergies  ?Allergen Reactions  ? Sulfa Antibiotics Rash  ? ? ? ?   ?Objective:  ?  ? ?Physical Exam  ?Vitals and nursing note reviewed.  ?Constitutional:   ?   General:  is not in acute distress. ?   Appearance: Normal appearance.  ?HENT:  ?   Head: Normocephalic. EOMI.  R conjunctiva sl injected ?Pulmonary:  ?   Effort: No respiratory distress.  ?Skin: ?   General: Skin is dry.  ?   Coloration:  Skin is not pale.  ?Neurological:  ?   Mental Status: Pt is alert and oriented to person, place, and time.  ?Psychiatric:     ?   Mood and Affect: Mood normal.  ? ?There were no vitals taken for this visit. ? ?Wt Readings from Last 3 Encounters:  ?02/15/21 149 lb (67.6 kg)  ?12/14/20 149 lb 3.2 oz (67.7 kg)  ?08/23/20 149 lb (67.6 kg)  ? ? ?   ?Assessment & Plan:  ? ?Problem List Items Addressed This Visit   ?None ?Visit Diagnoses   ? ? Acute viral conjunctivitis of right eye    -  Primary  ? ?  ? Conjunctivitis R-ofloxicin as contact lens wearer-advised no contact lenses for 5-7 days.  Warm compresses if needed ? ?Meds ordered this encounter  ?Medications  ? ofloxacin (OCUFLOX) 0.3 % ophthalmic solution  ?  Sig: Place 1 drop into the right eye 4  (four) times daily.  ?  Dispense:  5 mL  ?  Refill:  0  ? ? ?I discussed the assessment and treatment plan with the patient. The patient was provided an opportunity to ask questions and all were answered. The patient agreed with the plan and demonstrated an understanding of the instructions. ?  ?The patient was advised to call back or seek an in-person evaluation if the symptoms worsen or if the condition fails to improve as anticipated. ? ?I provided 13 minutes of face-to-face time during this encounter. ? ? ?Wellington Hampshire, MD ?[place] ?[phone] (phone) ?[phone] (fax) ? ?[facility]  ?

## 2021-06-16 ENCOUNTER — Other Ambulatory Visit: Payer: Self-pay | Admitting: *Deleted

## 2021-06-16 ENCOUNTER — Encounter: Payer: 59 | Admitting: Family Medicine

## 2021-06-16 DIAGNOSIS — Z79899 Other long term (current) drug therapy: Secondary | ICD-10-CM

## 2021-06-17 LAB — COMPLETE METABOLIC PANEL WITH GFR
AG Ratio: 1 (calc) (ref 1.0–2.5)
ALT: 8 U/L (ref 6–29)
AST: 36 U/L — ABNORMAL HIGH (ref 10–35)
Albumin: 4.3 g/dL (ref 3.6–5.1)
Alkaline phosphatase (APISO): 78 U/L (ref 37–153)
BUN: 15 mg/dL (ref 7–25)
CO2: 24 mmol/L (ref 20–32)
Calcium: 9.5 mg/dL (ref 8.6–10.4)
Chloride: 104 mmol/L (ref 98–110)
Creat: 0.84 mg/dL (ref 0.50–1.05)
Globulin: 4.3 g/dL (calc) — ABNORMAL HIGH (ref 1.9–3.7)
Glucose, Bld: 86 mg/dL (ref 65–99)
Potassium: 4.6 mmol/L (ref 3.5–5.3)
Sodium: 138 mmol/L (ref 135–146)
Total Bilirubin: 0.8 mg/dL (ref 0.2–1.2)
Total Protein: 8.6 g/dL — ABNORMAL HIGH (ref 6.1–8.1)
eGFR: 77 mL/min/{1.73_m2} (ref >=60)

## 2021-06-17 LAB — CBC WITH DIFFERENTIAL/PLATELET
Absolute Monocytes: 404 cells/uL (ref 200–950)
Basophils Absolute: 20 cells/uL (ref 0–200)
Basophils Relative: 0.5 %
Eosinophils Absolute: 0 cells/uL — ABNORMAL LOW (ref 15–500)
Eosinophils Relative: 0 %
HCT: 42.2 % (ref 35.0–45.0)
Hemoglobin: 14.1 g/dL (ref 11.7–15.5)
Lymphs Abs: 684 cells/uL — ABNORMAL LOW (ref 850–3900)
MCH: 29.7 pg (ref 27.0–33.0)
MCHC: 33.4 g/dL (ref 32.0–36.0)
MCV: 88.8 fL (ref 80.0–100.0)
MPV: 9.6 fL (ref 7.5–12.5)
Monocytes Relative: 10.1 %
Neutro Abs: 2892 cells/uL (ref 1500–7800)
Neutrophils Relative %: 72.3 %
Platelets: 258 10*3/uL (ref 140–400)
RBC: 4.75 10*6/uL (ref 3.80–5.10)
RDW: 13 % (ref 11.0–15.0)
Total Lymphocyte: 17.1 %
WBC: 4 10*3/uL (ref 3.8–10.8)

## 2021-06-17 NOTE — Progress Notes (Signed)
Lymphocyte count is low and stable due to immunosuppression.  Liver function is mildly elevated.  We will continue to monitor labs.

## 2021-07-04 ENCOUNTER — Other Ambulatory Visit: Payer: Self-pay | Admitting: Obstetrics & Gynecology

## 2021-07-04 ENCOUNTER — Other Ambulatory Visit: Payer: Self-pay | Admitting: Rheumatology

## 2021-07-04 ENCOUNTER — Other Ambulatory Visit (HOSPITAL_COMMUNITY): Payer: Self-pay

## 2021-07-04 DIAGNOSIS — Z1231 Encounter for screening mammogram for malignant neoplasm of breast: Secondary | ICD-10-CM

## 2021-07-04 MED ORDER — AZATHIOPRINE 50 MG PO TABS
50.0000 mg | ORAL_TABLET | Freq: Every day | ORAL | 0 refills | Status: DC
  Filled 2021-07-04: qty 90, 90d supply, fill #0

## 2021-07-05 ENCOUNTER — Other Ambulatory Visit (HOSPITAL_COMMUNITY): Payer: Self-pay

## 2021-07-05 ENCOUNTER — Encounter (HOSPITAL_COMMUNITY): Payer: Self-pay | Admitting: Pharmacist

## 2021-07-06 ENCOUNTER — Other Ambulatory Visit (HOSPITAL_COMMUNITY): Payer: Self-pay

## 2021-08-05 NOTE — Progress Notes (Deleted)
Office Visit Note  Patient: Jennifer Barr             Date of Birth: 1955-08-29           MRN: [id number]             PCP: [name], MD Referring: [name], MD Visit Date: 08/18/2021 Occupation: '@GUAROCC'$ @  Subjective:  No chief complaint on file.   History of Present Illness: Jennifer Barr is a 66 y.o. female ***   Activities of Daily Living:  Patient reports morning stiffness for *** {minute/hour:19697}.   Patient {ACTIONS;DENIES/REPORTS:21021675::"Denies"} nocturnal pain.  Difficulty dressing/grooming: {ACTIONS;DENIES/REPORTS:21021675::"Denies"} Difficulty climbing stairs: {ACTIONS;DENIES/REPORTS:21021675::"Denies"} Difficulty getting out of chair: {ACTIONS;DENIES/REPORTS:21021675::"Denies"} Difficulty using hands for taps, buttons, cutlery, and/or writing: {ACTIONS;DENIES/REPORTS:21021675::"Denies"}  No Rheumatology ROS completed.   PMFS History:  Patient Active Problem List   Diagnosis Date Noted   Peripheral neuropathy 12/14/2020   ANA positive 02/10/2016   Rheumatoid factor positive 02/10/2016   Primary osteoarthritis, right hand 02/10/2016   Scoliosis 02/10/2016   Raynaud's syndrome without gangrene 02/10/2016   Sjogren's disease ([place]) 05/07/2014   Vasculitis ([place]) 05/07/2014   Hashimoto's thyroiditis 05/07/2014    Past Medical History:  Diagnosis Date   Osteopenia 07/2017   T score -1.3 FRAX 7.2% / 0.6%   Premature ovarian failure    Sjoegren syndrome    Dr. Estanislado Pandy   Thyroid disease    Hashiomto   Vasculitis ([place])    Dr. Estanislado Pandy    Family History  Problem Relation Age of Onset   Diabetes Mother    Diabetes Father    Scleroderma Daughter    No past surgical history on file. Social History   Social History Narrative   Not on file   Immunization History  Administered Date(s) Administered   Influenza,inj,Quad PF,6+ Mos 10/14/2018   Moderna Sars-Covid-2 Vaccination 03/25/2019, 04/22/2019, 09/01/2019   Tdap 06/06/2018   Zoster  Recombinat (Shingrix) 03/01/2021     Objective: Vital Signs: There were no vitals taken for this visit.   Physical Exam   Musculoskeletal Exam: ***  CDAI Exam: CDAI Score: -- Patient Global: --; Provider Global: -- Swollen: --; Tender: -- Joint Exam 08/18/2021   No joint exam has been documented for this visit   There is currently no information documented on the homunculus. Go to the Rheumatology activity and complete the homunculus joint exam.  Investigation: No additional findings.  Imaging: No results found.  Recent Labs: Lab Results  Component Value Date   WBC 4.0 06/16/2021   HGB 14.1 06/16/2021   PLT 258 06/16/2021   NA 138 06/16/2021   K 4.6 06/16/2021   CL 104 06/16/2021   CO2 24 06/16/2021   GLUCOSE 86 06/16/2021   BUN 15 06/16/2021   CREATININE 0.84 06/16/2021   BILITOT 0.8 06/16/2021   ALKPHOS 81 09/01/2019   AST 36 (H) 06/16/2021   ALT 8 06/16/2021   PROT 8.6 (H) 06/16/2021   ALBUMIN 4.3 09/01/2019   CALCIUM 9.5 06/16/2021   GFRAA 79 06/17/2020    Speciality Comments: PLQ Eye Exam: 04/22/2019 WNL @ Marica Otter, OD PA  Procedures:  No procedures performed Allergies: Sulfa antibiotics   Assessment / Plan:     Visit Diagnoses: Sjogren's syndrome with other organ involvement ([place])  High risk medication use  Vasculitis ([place])  Raynaud's syndrome without gangrene  Lymphopenia  Primary osteoarthritis of both hands  Scoliosis  History of hypothyroidism  Osteopenia of multiple sites  Orders: No orders of the  defined types were placed in this encounter.  No orders of the defined types were placed in this encounter.   Face-to-face time spent with patient was *** minutes. Greater than 50% of time was spent in counseling and coordination of care.  Follow-Up Instructions: No follow-ups on file.   Ofilia Neas, PA-C  Note - This record has been created using Dragon software.  Chart creation errors have been sought, but may not  always  have been located. Such creation errors do not reflect on  the standard of medical care.

## 2021-08-16 ENCOUNTER — Ambulatory Visit: Payer: HMO | Attending: Rheumatology | Admitting: Rheumatology

## 2021-08-16 ENCOUNTER — Encounter: Payer: Self-pay | Admitting: Rheumatology

## 2021-08-16 VITALS — BP 114/72 | HR 65 | Resp 15 | Ht 68.5 in | Wt 148.2 lb

## 2021-08-16 DIAGNOSIS — M19041 Primary osteoarthritis, right hand: Secondary | ICD-10-CM | POA: Diagnosis not present

## 2021-08-16 DIAGNOSIS — Z8639 Personal history of other endocrine, nutritional and metabolic disease: Secondary | ICD-10-CM | POA: Diagnosis not present

## 2021-08-16 DIAGNOSIS — M419 Scoliosis, unspecified: Secondary | ICD-10-CM

## 2021-08-16 DIAGNOSIS — I7300 Raynaud's syndrome without gangrene: Secondary | ICD-10-CM | POA: Diagnosis not present

## 2021-08-16 DIAGNOSIS — M8589 Other specified disorders of bone density and structure, multiple sites: Secondary | ICD-10-CM

## 2021-08-16 DIAGNOSIS — Z79899 Other long term (current) drug therapy: Secondary | ICD-10-CM | POA: Diagnosis not present

## 2021-08-16 DIAGNOSIS — I776 Arteritis, unspecified: Secondary | ICD-10-CM

## 2021-08-16 DIAGNOSIS — M19042 Primary osteoarthritis, left hand: Secondary | ICD-10-CM

## 2021-08-16 DIAGNOSIS — M3509 Sicca syndrome with other organ involvement: Secondary | ICD-10-CM | POA: Diagnosis not present

## 2021-08-16 DIAGNOSIS — D72810 Lymphocytopenia: Secondary | ICD-10-CM

## 2021-08-16 NOTE — Patient Instructions (Signed)
Standing Labs We placed an order today for your standing lab work.   Please have your standing labs drawn in September and every 3 months  If possible, please have your labs drawn 2 weeks prior to your appointment so that the provider can discuss your results at your appointment.  Please note that you may see your imaging and lab results in [place] before we have reviewed them. We may be awaiting multiple results to interpret others before contacting you. Please allow our office up to 72 hours to thoroughly review all of the results before contacting the office for clarification of your results.  We have open lab daily: Monday through Thursday from 1:30-4:30 PM and Friday from 1:30-4:00 PM at the office of Dr. Bo Merino, [facility].   Please be advised, all patients with office appointments requiring lab work will take precedent over walk-in lab work.  If possible, please come for your lab work on Monday and Friday afternoons, as you may experience shorter wait times. The office is located at [address], [place], [place],  [address] No appointment is necessary.   Labs are drawn by [facility]. Please bring your co-pay at the time of your lab draw.  You may receive a bill from [facility] for your lab work.  Please note if you are on Hydroxychloroquine and and an order has been placed for a Hydroxychloroquine level, you will need to have it drawn 4 hours or more after your last dose.  If you wish to have your labs drawn at another location, please call the office 24 hours in advance to send orders.  If you have any questions regarding directions or hours of operation,  please call [phone].   As a reminder, please drink plenty of water prior to coming for your lab work. Thanks!   Vaccines You are taking a medication(s) that can suppress your immune system.  The following immunizations are recommended: Flu annually Covid-19  Td/Tdap (tetanus, diphtheria,  pertussis) every 10 years Pneumonia (Prevnar 15 then Pneumovax 23 at least 1 year apart.  Alternatively, can take Prevnar 20 without needing additional dose) Shingrix: 2 doses from 4 weeks to 6 months apart  Please check with your PCP to make sure you are up to date.

## 2021-08-16 NOTE — Progress Notes (Signed)
Office Visit Note  Patient: Jennifer Barr             Date of Birth: May 27, 1955           MRN: [id number]             PCP: [name], MD Referring: [name], MD Visit Date: 08/16/2021 Occupation: '@GUAROCC'$ @  Subjective:  Medication management  History of Present Illness: Jennifer Barr is a 66 y.o. female with history of Sjogren's, vasculitis and Raynauds.  Patient states that she stopped taking pilocarpine as she did not think it was helping much and was expensive.  She states she was placed on Tyrvaya nasal spray by her ophthalmologist which was helpful with dry eyes.  Although it is very expensive as well.  She continues to have dry mouth symptoms.  She has been using over-the-counter products.  She has been on Imuran 50 mg p.o. daily which has been tolerating well.  She denies any history of joint swelling.  She has Raynauds symptoms.  During the winter months.  She continues to have some paresthesias in her feet.  She has not had any rash from vasculitis.  Activities of Daily Living:  Patient reports morning stiffness for 0 minute.   Patient Denies nocturnal pain.  Difficulty dressing/grooming: Denies Difficulty climbing stairs: Denies Difficulty getting out of chair: Denies Difficulty using hands for taps, buttons, cutlery, and/or writing: Denies  Review of Systems  Constitutional:  Negative for fatigue.  HENT:  Positive for mouth dryness. Negative for mouth sores.   Eyes:  Positive for dryness.  Respiratory:  Negative for shortness of breath and difficulty breathing.   Cardiovascular:  Negative for palpitations and hypertension.  Gastrointestinal:  Negative for abdominal pain, blood in stool, constipation and diarrhea.  Endocrine: Negative for increased urination.  Genitourinary:  Negative for involuntary urination.  Musculoskeletal:  Negative for joint pain, gait problem, joint pain, joint swelling, myalgias and myalgias.  Skin:  Positive for color change.  Negative for rash and sensitivity to sunlight.  Allergic/Immunologic: Negative for susceptible to infections.  Neurological:  Positive for parasthesias. Negative for dizziness, fainting, headaches and weakness.  Hematological:  Negative for swollen glands.  Psychiatric/Behavioral:  Negative for depressed mood and sleep disturbance. The patient is not nervous/anxious.     PMFS History:  Patient Active Problem List   Diagnosis Date Noted   Peripheral neuropathy 12/14/2020   ANA positive 02/10/2016   Rheumatoid factor positive 02/10/2016   Primary osteoarthritis, right hand 02/10/2016   Scoliosis 02/10/2016   Raynaud's syndrome without gangrene 02/10/2016   Sjogren's disease ([place]) 05/07/2014   Vasculitis ([place]) 05/07/2014   Hashimoto's thyroiditis 05/07/2014    Past Medical History:  Diagnosis Date   Osteopenia 07/2017   T score -1.3 FRAX 7.2% / 0.6%   Premature ovarian failure    Sjoegren syndrome    Dr. Estanislado Pandy   Thyroid disease    Hashiomto   Vasculitis ([place])    Dr. Estanislado Pandy    Family History  Problem Relation Age of Onset   Diabetes Mother    Diabetes Father    Scleroderma Daughter    History reviewed. No pertinent surgical history. Social History   Social History Narrative   Not on file   Immunization History  Administered Date(s) Administered   Influenza,inj,Quad PF,6+ Mos 10/14/2018   Moderna Sars-Covid-2 Vaccination 03/25/2019, 04/22/2019, 09/01/2019   Tdap 06/06/2018   Zoster Recombinat (Shingrix) 03/01/2021     Objective: Vital Signs: BP 114/72 (  BP Location: Left Arm, Patient Position: Sitting, Cuff Size: Normal)   Pulse 65   Resp 15   Ht 5' 8.5" (1.74 m)   Wt 148 lb 3.2 oz (67.2 kg)   BMI 22.21 kg/m    Physical Exam Vitals and nursing note reviewed.  Constitutional:      Appearance: She is well-developed.  HENT:     Head: Normocephalic and atraumatic.  Eyes:     Conjunctiva/sclera: Conjunctivae normal.  Cardiovascular:     Rate and  Rhythm: Normal rate and regular rhythm.     Heart sounds: Normal heart sounds.  Pulmonary:     Effort: Pulmonary effort is normal.     Breath sounds: Normal breath sounds.  Abdominal:     General: Bowel sounds are normal.     Palpations: Abdomen is soft.  Musculoskeletal:     Cervical back: Normal range of motion.  Lymphadenopathy:     Cervical: No cervical adenopathy.  Skin:    General: Skin is warm and dry.     Capillary Refill: Capillary refill takes less than 2 seconds.  Neurological:     Mental Status: She is alert and oriented to person, place, and time.  Psychiatric:        Behavior: Behavior normal.      Musculoskeletal Exam: C-spine thoracic and lumbar spine were in good range of motion.  Shoulder joints, elbow joints, wrist joints, MCPs PIPs and DIPs with good range of motion with no synovitis.  Hip joints, knee joints, ankles, MTPs and PIPs been good range of motion with no synovitis.  CDAI Exam: CDAI Score: -- Patient Global: --; Provider Global: -- Swollen: --; Tender: -- Joint Exam 08/16/2021   No joint exam has been documented for this visit   There is currently no information documented on the homunculus. Go to the Rheumatology activity and complete the homunculus joint exam.  Investigation: No additional findings.  Imaging: No results found.  Recent Labs: Lab Results  Component Value Date   WBC 4.0 06/16/2021   HGB 14.1 06/16/2021   PLT 258 06/16/2021   NA 138 06/16/2021   K 4.6 06/16/2021   CL 104 06/16/2021   CO2 24 06/16/2021   GLUCOSE 86 06/16/2021   BUN 15 06/16/2021   CREATININE 0.84 06/16/2021   BILITOT 0.8 06/16/2021   ALKPHOS 81 09/01/2019   AST 36 (H) 06/16/2021   ALT 8 06/16/2021   PROT 8.6 (H) 06/16/2021   ALBUMIN 4.3 09/01/2019   CALCIUM 9.5 06/16/2021   GFRAA 79 06/17/2020    Speciality Comments: PLQ Eye Exam: 04/22/2019 WNL @ Marica Otter, OD PA  Procedures:  No procedures performed Allergies: Sulfa antibiotics    Assessment / Plan:     Visit Diagnoses: Sjogren's syndrome with other organ involvement ([place]) - positive ANA, positive RF, positive Ro, positive La.  Rheumatoid factor was negative and SPEP was normal in August 2020.  She continues to have dry mouth and dry eye symptoms.  She stopped pilocarpine as she felt it was not effective.  She has been using over-the-counter products which has been helpful.  We had some further discussion about over-the-counter products.  She is also using Tyrvaya nasal spray which helps her with the dry eyes.  High risk medication use - Imuran 50 mg by mouth daily. (PLQ was discontinued and June due to lymphopenia).  Labs obtained on June 16, 2021 CBC with differential is normal.  CMP with GFR showed AST elevation at 36 rest of the  labs were negative.  She will have labs in September and then every 3 months to monitor for drug toxicity.  Information regarding immunization was placed in the AVS.  Patient does not want to have repeat SPEP due to the cost.  Vasculitis ([place]) - No recurrence since July 2020.  Patient has not noticed any recurrence of vasculitis since she retired.  Raynaud's syndrome without gangrene-she has intermittent symptoms of Raynaud's mostly during the winter months.  Lymphopenia - she was evaluated by Dr.Kale in the past and was advised that lymphopenia is related to medications and autoimmune disease.  Primary osteoarthritis of both hands-joint protection muscle strengthening was discussed.  Other medical problems listed as follows:  Scoliosis  History of hypothyroidism  Osteopenia of multiple sites - July 11, 2017 DEXA scan showed osteopenia with T score -1.3 and right femoral neck.  She plans to get DEXA scan through her GYN.    Orders: No orders of the defined types were placed in this encounter.  No orders of the defined types were placed in this encounter.    Follow-Up Instructions: Return in about 5 months (around 01/16/2022) for  Sjogren's.   Bo Merino, MD  Note - This record has been created using Editor, commissioning.  Chart creation errors have been sought, but may not always  have been located. Such creation errors do not reflect on  the standard of medical care.

## 2021-08-18 ENCOUNTER — Ambulatory Visit: Payer: 59 | Admitting: Rheumatology

## 2021-08-18 DIAGNOSIS — M3509 Sicca syndrome with other organ involvement: Secondary | ICD-10-CM

## 2021-08-18 DIAGNOSIS — Z79899 Other long term (current) drug therapy: Secondary | ICD-10-CM

## 2021-08-18 DIAGNOSIS — M19041 Primary osteoarthritis, right hand: Secondary | ICD-10-CM

## 2021-08-18 DIAGNOSIS — M419 Scoliosis, unspecified: Secondary | ICD-10-CM

## 2021-08-18 DIAGNOSIS — D72810 Lymphocytopenia: Secondary | ICD-10-CM

## 2021-08-18 DIAGNOSIS — I7300 Raynaud's syndrome without gangrene: Secondary | ICD-10-CM

## 2021-08-18 DIAGNOSIS — I776 Arteritis, unspecified: Secondary | ICD-10-CM

## 2021-08-18 DIAGNOSIS — Z8639 Personal history of other endocrine, nutritional and metabolic disease: Secondary | ICD-10-CM

## 2021-08-18 DIAGNOSIS — M8589 Other specified disorders of bone density and structure, multiple sites: Secondary | ICD-10-CM

## 2021-08-25 ENCOUNTER — Ambulatory Visit: Payer: HMO

## 2021-09-01 ENCOUNTER — Ambulatory Visit
Admission: RE | Admit: 2021-09-01 | Discharge: 2021-09-01 | Disposition: A | Discharge status: Home / Self Care | Payer: PPO | Source: Ambulatory Visit | Attending: Obstetrics & Gynecology | Admitting: Obstetrics & Gynecology

## 2021-09-01 DIAGNOSIS — Z1231 Encounter for screening mammogram for malignant neoplasm of breast: Secondary | ICD-10-CM

## 2021-09-08 ENCOUNTER — Encounter: Payer: Self-pay | Admitting: Obstetrics & Gynecology

## 2021-09-08 ENCOUNTER — Ambulatory Visit (INDEPENDENT_AMBULATORY_CARE_PROVIDER_SITE_OTHER): Payer: PPO | Admitting: Obstetrics & Gynecology

## 2021-09-08 ENCOUNTER — Other Ambulatory Visit (HOSPITAL_COMMUNITY): Payer: Self-pay

## 2021-09-08 VITALS — BP 118/70 | HR 59 | Ht 67.75 in | Wt 147.0 lb

## 2021-09-08 DIAGNOSIS — Z9189 Other specified personal risk factors, not elsewhere classified: Secondary | ICD-10-CM

## 2021-09-08 DIAGNOSIS — M8589 Other specified disorders of bone density and structure, multiple sites: Secondary | ICD-10-CM | POA: Diagnosis not present

## 2021-09-08 DIAGNOSIS — B009 Herpesviral infection, unspecified: Secondary | ICD-10-CM | POA: Diagnosis not present

## 2021-09-08 DIAGNOSIS — Z780 Asymptomatic menopausal state: Secondary | ICD-10-CM

## 2021-09-08 DIAGNOSIS — E063 Autoimmune thyroiditis: Secondary | ICD-10-CM

## 2021-09-08 DIAGNOSIS — Z9289 Personal history of other medical treatment: Secondary | ICD-10-CM

## 2021-09-08 DIAGNOSIS — Z01419 Encounter for gynecological examination (general) (routine) without abnormal findings: Secondary | ICD-10-CM

## 2021-09-08 MED ORDER — SYNTHROID 75 MCG PO TABS
75.0000 ug | ORAL_TABLET | Freq: Every day | ORAL | 4 refills | Status: AC
Start: 2021-09-08 — End: ?
  Filled 2021-09-08 – 2021-09-27 (×2): qty 90, 90d supply, fill #0
  Filled 2021-12-25: qty 90, 90d supply, fill #1
  Filled 2022-04-11: qty 90, 90d supply, fill #2
  Filled 2022-07-06: qty 90, 90d supply, fill #3
  Filled 2022-08-20 – 2022-08-22 (×2): qty 90, 90d supply, fill #4

## 2021-09-08 NOTE — Progress Notes (Signed)
Jennifer Barr 02/22/1955 [id number]   History:    66 y.o. [id number] Married.  Retired x 2 years.  [employer] at [employer].  5 grand-children.  Having all the grand-children on Fridays.   RP:  Established patient presenting for annual gyn exam    HPI: Postmenopause, well on no HRT.  No PMB.  No pelvic pain.  Dryness with IC, recommend coconut oil.  Had a small fissure from IC yesterday.  No h/o abnormal Pap.  Pap Neg 08/2020. Will repeat at 2-3 years.  Urine/BMs normal.  Breasts normal.  Mammo Neg 08/2021.  BMI 22.52.  Very fit.  Healthy nutrition. BD Osteopenia T-Score -1.3 at Bilateral Femoral Necks 07/2017.  Will repeat BD here now.  Colonoscopy 12/2017.  Hypothyroidism on Synthroid. TSH today.  Synthroid prescription to pharmacy.  Sjogren's Disease, getting CMP/CBC regularly.    Past medical history,surgical history, family history and social history were all reviewed and documented in the EPIC chart.  Gynecologic History No LMP recorded. Patient is postmenopausal.  Obstetric History OB History  Gravida Para Term Preterm AB Living  '4 3 3 '$ 0 1 3  SAB IAB Ectopic Multiple Live Births  1            # Outcome Date GA Lbr Len/2nd Weight Sex Delivery Anes PTL Lv  4 SAB           3 Term           2 Term           1 Term              ROS: A ROS was performed and pertinent positives and negatives are included in the history. GENERAL: No fevers or chills. HEENT: No change in vision, no earache, sore throat or sinus congestion. NECK: No pain or stiffness. CARDIOVASCULAR: No chest pain or pressure. No palpitations. PULMONARY: No shortness of breath, cough or wheeze. GASTROINTESTINAL: No abdominal pain, nausea, vomiting or diarrhea, melena or bright red blood per rectum. GENITOURINARY: No urinary frequency, urgency, hesitancy or dysuria. MUSCULOSKELETAL: No joint or muscle pain, no back pain, no recent trauma. DERMATOLOGIC: No rash, no itching, no lesions. ENDOCRINE: No polyuria, polydipsia, no heat or  cold intolerance. No recent change in weight. HEMATOLOGICAL: No anemia or easy bruising or bleeding. NEUROLOGIC: No headache, seizures, numbness, tingling or weakness. PSYCHIATRIC: No depression, no loss of interest in normal activity or change in sleep pattern.     Exam:   BP 118/70   Pulse (!) 59   Ht 5' 7.75" (1.721 m)   Wt 147 lb (66.7 kg)   SpO2 99%   BMI 22.52 kg/m   Body mass index is 22.52 kg/m.  General appearance : Well developed well nourished female. No acute distress HEENT: Eyes: no retinal hemorrhage or exudates,  Neck supple, trachea midline, no carotid bruits, no thyroidmegaly Lungs: Clear to auscultation, no rhonchi or wheezes, or rib retractions  Heart: Regular rate and rhythm, no murmurs or gallops Breast:Examined in sitting and supine position were symmetrical in appearance, no palpable masses or tenderness,  no skin retraction, no nipple inversion, no nipple discharge, no skin discoloration, no axillary or supraclavicular lymphadenopathy Abdomen: no palpable masses or tenderness, no rebound or guarding Extremities: no edema or skin discoloration or tenderness  Pelvic: Vulva: Normal except for a small fissure at the left labia minora (from IC last night per patient)             Vagina:  No gross lesions or discharge  Cervix: No gross lesions or discharge  Uterus  AV, normal size, shape and consistency, non-tender and mobile  Adnexa  Without masses or tenderness  Anus: Normal   Assessment/Plan:  66 y.o. female for annual exam   1. Well female exam with routine gynecological exam Postmenopause, well on no HRT.  No PMB.  No pelvic pain.  Dryness with IC, recommend coconut oil.  Had a small fissure from IC yesterday.  No h/o abnormal Pap.  Pap Neg 08/2020. Will repeat at 2-3 years.  Urine/BMs normal.  Breasts normal.  Mammo Neg 08/2021.  BMI 22.52.  Very fit.  Healthy nutrition. BD Osteopenia T-Score -1.3 at Bilateral Femoral Necks 07/2017.  Will repeat BD here now.   Colonoscopy 12/2017.  Hypothyroidism on Synthroid. TSH today.  Synthroid prescription to pharmacy.  Sjogren's Disease, getting CMP/CBC regularly.  Recommend a full skin exam with Dermato.   2. Postmenopause Postmenopause, well on no HRT.  No PMB.  No pelvic pain.  Dryness with IC, recommend coconut oil.  Had a small fissure from IC yesterday.  3. Osteopenia of multiple sites BMI 22.52.  Very fit.  Healthy nutrition. BD Osteopenia T-Score -1.3 at Bilateral Femoral Necks 07/2017.  Will repeat BD here now.  Vit D, Ca++ 1.5 g/d total, regular wt bearing activities. - DG Bone Density; Future  4. Hashimoto's thyroiditis Continue on Synthroid.  Prescription sent to pharmacy.  Will check TSH today. - TSH  5. Personal history of other medical treatment  Other orders - SYNTHROID 75 MCG tablet; Take 1 tablet (75 mcg total) by mouth daily before breakfast.   Princess Bruins MD, 3:48 PM 09/08/2021

## 2021-09-09 LAB — TSH: TSH: 3.22 mIU/L (ref 0.40–4.50)

## 2021-09-15 ENCOUNTER — Other Ambulatory Visit: Payer: Self-pay | Admitting: *Deleted

## 2021-09-15 DIAGNOSIS — Z79899 Other long term (current) drug therapy: Secondary | ICD-10-CM | POA: Diagnosis not present

## 2021-09-16 LAB — COMPLETE METABOLIC PANEL WITH GFR
AG Ratio: 1.2 (calc) (ref 1.0–2.5)
ALT: 10 U/L (ref 6–29)
AST: 38 U/L — ABNORMAL HIGH (ref 10–35)
Albumin: 4.2 g/dL (ref 3.6–5.1)
Alkaline phosphatase (APISO): 67 U/L (ref 37–153)
BUN: 15 mg/dL (ref 7–25)
CO2: 27 mmol/L (ref 20–32)
Calcium: 9 mg/dL (ref 8.6–10.4)
Chloride: 100 mmol/L (ref 98–110)
Creat: 0.95 mg/dL (ref 0.50–1.05)
Globulin: 3.5 g/dL (calc) (ref 1.9–3.7)
Glucose, Bld: 82 mg/dL (ref 65–99)
Potassium: 3.9 mmol/L (ref 3.5–5.3)
Sodium: 133 mmol/L — ABNORMAL LOW (ref 135–146)
Total Bilirubin: 1 mg/dL (ref 0.2–1.2)
Total Protein: 7.7 g/dL (ref 6.1–8.1)
eGFR: 66 mL/min/{1.73_m2} (ref >=60)

## 2021-09-16 LAB — CBC WITH DIFFERENTIAL/PLATELET
Absolute Monocytes: 450 cells/uL (ref 200–950)
Basophils Absolute: 18 cells/uL (ref 0–200)
Basophils Relative: 0.4 %
Eosinophils Absolute: 0 cells/uL — ABNORMAL LOW (ref 15–500)
Eosinophils Relative: 0 %
HCT: 37.7 % (ref 35.0–45.0)
Hemoglobin: 12.6 g/dL (ref 11.7–15.5)
Lymphs Abs: 756 cells/uL — ABNORMAL LOW (ref 850–3900)
MCH: 29.2 pg (ref 27.0–33.0)
MCHC: 33.4 g/dL (ref 32.0–36.0)
MCV: 87.3 fL (ref 80.0–100.0)
MPV: 9.9 fL (ref 7.5–12.5)
Monocytes Relative: 10 %
Neutro Abs: 3276 cells/uL (ref 1500–7800)
Neutrophils Relative %: 72.8 %
Platelets: 225 10*3/uL (ref 140–400)
RBC: 4.32 10*6/uL (ref 3.80–5.10)
RDW: 12.5 % (ref 11.0–15.0)
Total Lymphocyte: 16.8 %
WBC: 4.5 10*3/uL (ref 3.8–10.8)

## 2021-09-16 NOTE — Progress Notes (Signed)
Sodium is low.  Liver function is mildly elevated and stable.  Lymphocyte count count is low and stable (due to immunosuppression).

## 2021-09-27 ENCOUNTER — Other Ambulatory Visit (HOSPITAL_COMMUNITY): Payer: Self-pay

## 2021-09-28 ENCOUNTER — Other Ambulatory Visit: Payer: Self-pay | Admitting: Obstetrics & Gynecology

## 2021-09-28 ENCOUNTER — Other Ambulatory Visit (HOSPITAL_COMMUNITY): Payer: Self-pay

## 2021-09-28 ENCOUNTER — Ambulatory Visit (INDEPENDENT_AMBULATORY_CARE_PROVIDER_SITE_OTHER): Payer: PPO

## 2021-09-28 DIAGNOSIS — Z780 Asymptomatic menopausal state: Secondary | ICD-10-CM

## 2021-09-28 DIAGNOSIS — M8589 Other specified disorders of bone density and structure, multiple sites: Secondary | ICD-10-CM

## 2021-09-28 DIAGNOSIS — Z13820 Encounter for screening for osteoporosis: Secondary | ICD-10-CM | POA: Diagnosis not present

## 2021-10-01 ENCOUNTER — Other Ambulatory Visit: Payer: Self-pay | Admitting: Physician Assistant

## 2021-10-03 ENCOUNTER — Encounter: Payer: Self-pay | Admitting: *Deleted

## 2021-10-03 ENCOUNTER — Other Ambulatory Visit (HOSPITAL_COMMUNITY): Payer: Self-pay

## 2021-10-03 MED ORDER — AZATHIOPRINE 50 MG PO TABS
50.0000 mg | ORAL_TABLET | Freq: Every day | ORAL | 0 refills | Status: DC
Start: 2021-10-03 — End: 2021-12-25
  Filled 2021-10-03: qty 90, 90d supply, fill #0

## 2021-10-03 NOTE — Telephone Encounter (Signed)
Next Visit: 02/16/2022  Last Visit: 08/16/2021  Last Fill: 07/04/2021  DX: Sjogren's syndrome with other organ involvement   Current Dose per office note 08/16/2021: Imuran 50 mg by mouth daily  Labs: 09/15/2021 Sodium is low.  Liver function is mildly elevated and stable.  Lymphocyte count count is low and stable (due to immunosuppression).  Okay to refill Imuran?

## 2021-10-04 ENCOUNTER — Other Ambulatory Visit (HOSPITAL_COMMUNITY): Payer: Self-pay

## 2021-10-19 ENCOUNTER — Ambulatory Visit (INDEPENDENT_AMBULATORY_CARE_PROVIDER_SITE_OTHER): Payer: PPO

## 2021-10-19 DIAGNOSIS — Z23 Encounter for immunization: Secondary | ICD-10-CM

## 2021-11-18 ENCOUNTER — Other Ambulatory Visit (HOSPITAL_COMMUNITY): Payer: Self-pay

## 2021-11-21 ENCOUNTER — Other Ambulatory Visit (HOSPITAL_COMMUNITY): Payer: Self-pay

## 2021-12-25 ENCOUNTER — Other Ambulatory Visit: Payer: Self-pay | Admitting: Rheumatology

## 2021-12-25 DIAGNOSIS — Z79899 Other long term (current) drug therapy: Secondary | ICD-10-CM

## 2021-12-26 ENCOUNTER — Encounter: Payer: Self-pay | Admitting: *Deleted

## 2021-12-26 ENCOUNTER — Other Ambulatory Visit: Payer: Self-pay

## 2021-12-26 ENCOUNTER — Other Ambulatory Visit (HOSPITAL_COMMUNITY): Payer: Self-pay

## 2021-12-26 ENCOUNTER — Other Ambulatory Visit: Payer: Self-pay | Admitting: *Deleted

## 2021-12-26 DIAGNOSIS — Z79899 Other long term (current) drug therapy: Secondary | ICD-10-CM

## 2021-12-26 MED ORDER — AZATHIOPRINE 50 MG PO TABS
50.0000 mg | ORAL_TABLET | Freq: Every day | ORAL | 0 refills | Status: DC
Start: 2021-12-26 — End: 2022-02-09
  Filled 2021-12-26 (×2): qty 30, 30d supply, fill #0

## 2021-12-26 NOTE — Telephone Encounter (Signed)
Next Visit: 02/16/2022   Last Visit: 08/16/2021   Last Fill: 07/04/2021   DX: Sjogren's syndrome with other organ involvement    Current Dose per office note 08/16/2021: Imuran 50 mg by mouth daily   Labs: 09/15/2021 Sodium is low.  Liver function is mildly elevated and stable.  Lymphocyte count count is low and stable (due to immunosuppression).  Patient advised via my chart she is due to update labs.    Okay to refill Imuran?

## 2021-12-27 LAB — COMPLETE METABOLIC PANEL WITH GFR
AG Ratio: 1.2 (calc) (ref 1.0–2.5)
ALT: 9 U/L (ref 6–29)
AST: 39 U/L — ABNORMAL HIGH (ref 10–35)
Albumin: 4.2 g/dL (ref 3.6–5.1)
Alkaline phosphatase (APISO): 74 U/L (ref 37–153)
BUN: 15 mg/dL (ref 7–25)
CO2: 25 mmol/L (ref 20–32)
Calcium: 9.1 mg/dL (ref 8.6–10.4)
Chloride: 103 mmol/L (ref 98–110)
Creat: 0.82 mg/dL (ref 0.50–1.05)
Globulin: 3.5 g/dL (calc) (ref 1.9–3.7)
Glucose, Bld: 92 mg/dL (ref 65–99)
Potassium: 4.3 mmol/L (ref 3.5–5.3)
Sodium: 135 mmol/L (ref 135–146)
Total Bilirubin: 0.6 mg/dL (ref 0.2–1.2)
Total Protein: 7.7 g/dL (ref 6.1–8.1)
eGFR: 79 mL/min/{1.73_m2} (ref >=60)

## 2021-12-27 LAB — CBC WITH DIFFERENTIAL/PLATELET
Absolute Monocytes: 307 cells/uL (ref 200–950)
Basophils Absolute: 19 cells/uL (ref 0–200)
Basophils Relative: 0.4 %
Eosinophils Absolute: 0 cells/uL — ABNORMAL LOW (ref 15–500)
Eosinophils Relative: 0 %
HCT: 36.6 % (ref 35.0–45.0)
Hemoglobin: 12.3 g/dL (ref 11.7–15.5)
Lymphs Abs: 547 cells/uL — ABNORMAL LOW (ref 850–3900)
MCH: 29.3 pg (ref 27.0–33.0)
MCHC: 33.6 g/dL (ref 32.0–36.0)
MCV: 87.1 fL (ref 80.0–100.0)
MPV: 9.8 fL (ref 7.5–12.5)
Monocytes Relative: 6.4 %
Neutro Abs: 3926 cells/uL (ref 1500–7800)
Neutrophils Relative %: 81.8 %
Platelets: 244 10*3/uL (ref 140–400)
RBC: 4.2 10*6/uL (ref 3.80–5.10)
RDW: 12.6 % (ref 11.0–15.0)
Total Lymphocyte: 11.4 %
WBC: 4.8 10*3/uL (ref 3.8–10.8)

## 2021-12-27 NOTE — Progress Notes (Signed)
Lymphocyte count is low due to immunosuppression.  Liver function is mildly elevated and stable.

## 2022-02-06 NOTE — Progress Notes (Signed)
Office Visit Note  Patient: Jennifer Barr             Date of Birth: [birth year]           MRN: [id number]             PCP: [name], MD Referring: [name], MD Visit Date: 02/16/2022 Occupation: '@GUAROCC'$ @  Subjective:  Rash  History of Present Illness: Jennifer Barr is a 67 y.o. female with history of Sjogren's, vasculitis and Raynaud's phenomenon.  She states that she developed redness on her both feet recently.  She decided to go to Dr. Milinda Pointer for evaluation.  She states Dr. Had suspected Raynauds versus vasculitis and advised her to use Nervive.  Patient states she was experiencing some tingling in her feet which got better.  Patient states that the rash is improving.  She continues to have dry mouth and dry eye symptoms.  She stopped pilocarpine due to the cost.  She continues to be on Imuran 50 mg p.o. daily.   Activities of Daily Living:  Patient reports morning stiffness for 0 minute.   Patient Denies nocturnal pain.  Difficulty dressing/grooming: Denies Difficulty climbing stairs: Denies Difficulty getting out of chair: Denies Difficulty using hands for taps, buttons, cutlery, and/or writing: Denies  Review of Systems  Constitutional: Negative.  Negative for fatigue.  HENT:  Positive for mouth dryness. Negative for mouth sores.   Eyes:  Positive for dryness.  Respiratory: Negative.  Negative for shortness of breath.   Cardiovascular: Negative.  Negative for chest pain and palpitations.  Gastrointestinal:  Positive for constipation. Negative for blood in stool and diarrhea.  Endocrine: Negative.  Negative for increased urination.  Genitourinary: Negative.  Negative for involuntary urination.  Musculoskeletal: Negative.  Negative for joint pain, gait problem, joint pain, joint swelling, myalgias, muscle weakness, morning stiffness, muscle tenderness and myalgias.  Skin:  Positive for rash. Negative for color change, hair loss and sensitivity to sunlight.   Allergic/Immunologic: Negative.  Negative for susceptible to infections.  Neurological: Negative.  Negative for dizziness and headaches.  Hematological: Negative.  Negative for swollen glands.  Psychiatric/Behavioral: Negative.  Negative for depressed mood and sleep disturbance. The patient is not nervous/anxious.     PMFS History:  Patient Active Problem List   Diagnosis Date Noted   Peripheral neuropathy 12/14/2020   Acute parotitis 12/03/2017   ANA positive 02/10/2016   Rheumatoid factor positive 02/10/2016   Primary osteoarthritis, right hand 02/10/2016   Scoliosis 02/10/2016   Raynaud's syndrome without gangrene 02/10/2016   Sjogren's disease ([place]) 05/07/2014   Vasculitis ([place]) 05/07/2014   Hashimoto's thyroiditis 05/07/2014    Past Medical History:  Diagnosis Date   HSV infection    Osteopenia 07/2017   T score -1.3 FRAX 7.2% / 0.6%   Premature ovarian failure    Sjoegren syndrome    Dr. Estanislado Pandy   Thyroid disease    Hashiomto   Vasculitis ([place])    Dr. Estanislado Pandy    Family History  Problem Relation Age of Onset   Diabetes Mother    Diabetes Father    Scleroderma Daughter    Past Surgical History:  Procedure Laterality Date   TUBAL LIGATION     Social History   Social History Narrative   Not on file   Immunization History  Administered Date(s) Administered   Fluad Quad(high Dose 65+) 10/19/2021   Influenza Inj Mdck Quad Pf 10/25/2020   Influenza, High Dose Seasonal PF 10/14/2018  Influenza,inj,Quad PF,6+ Mos 10/14/2018   Influenza-Unspecified 10/25/2020   Moderna Sars-Covid-2 Vaccination 03/25/2019, 04/22/2019, 09/01/2019   PNEUMOCOCCAL CONJUGATE-20 10/19/2021   Tdap 06/06/2018   Zoster Recombinat (Shingrix) 10/25/2020, 03/01/2021     Objective: Vital Signs: BP 107/80 (BP Location: Left Arm, Patient Position: Sitting, Cuff Size: Small)   Pulse 81   Resp 16   Ht 5' 7.75" (1.721 m)   Wt 154 lb (69.9 kg)   BMI 23.59 kg/m    Physical  Exam Vitals and nursing note reviewed.  Constitutional:      Appearance: She is well-developed.  HENT:     Head: Normocephalic and atraumatic.  Eyes:     Conjunctiva/sclera: Conjunctivae normal.  Cardiovascular:     Rate and Rhythm: Normal rate and regular rhythm.     Heart sounds: Normal heart sounds.  Pulmonary:     Effort: Pulmonary effort is normal.     Breath sounds: Normal breath sounds.  Abdominal:     General: Bowel sounds are normal.     Palpations: Abdomen is soft.  Musculoskeletal:     Cervical back: Normal range of motion.  Lymphadenopathy:     Cervical: No cervical adenopathy.  Skin:    General: Skin is warm and dry.     Capillary Refill: Capillary refill takes less than 2 seconds.     Comments: Purpuric lesions were noted around the perianal region on her right third and fourth toe and left first second and third toes.  The feet were cold to touch.  She had good capillary refill.  Neurological:     Mental Status: She is alert and oriented to person, place, and time.  Psychiatric:        Behavior: Behavior normal.      Musculoskeletal Exam: C-spine was in good range of motion.  Shoulder joints, elbow joints, wrist joints, MCPs PIPs and DIPs and good range of motion with no synovitis.  Hip joints and knee joints in good range of motion.  She had no tenderness over ankles or MTPs.  CDAI Exam: CDAI Score: -- Patient Global: --; Provider Global: -- Swollen: --; Tender: -- Joint Exam 02/16/2022   No joint exam has been documented for this visit   There is currently no information documented on the homunculus. Go to the Rheumatology activity and complete the homunculus joint exam.  Investigation: No additional findings.  Imaging: No results found.  Recent Labs: Lab Results  Component Value Date   WBC 4.8 12/26/2021   HGB 12.3 12/26/2021   PLT 244 12/26/2021   NA 135 12/26/2021   K 4.3 12/26/2021   CL 103 12/26/2021   CO2 25 12/26/2021   GLUCOSE 92  12/26/2021   BUN 15 12/26/2021   CREATININE 0.82 12/26/2021   BILITOT 0.6 12/26/2021   ALKPHOS 81 09/01/2019   AST 39 (H) 12/26/2021   ALT 9 12/26/2021   PROT 7.7 12/26/2021   ALBUMIN 4.3 09/01/2019   CALCIUM 9.1 12/26/2021   GFRAA 79 06/17/2020    Speciality Comments: PLQ Eye Exam: 04/22/2019 WNL @ [facility], OD PA  Procedures:  No procedures performed Allergies: Sulfa antibiotics   Assessment / Plan:     Visit Diagnoses: Sjogren's syndrome with other organ involvement ([place]) - positive ANA, positive RF, positive Ro, positive La.  Rheumatoid factor was negative and SPEP was normal in August 2020. -Patient continues to have dry mouth and dry eye symptoms.  Her symptoms have been worse since she stopped pilocarpine.  She stopped pilocarpine  due to the cost.  She continues to be on Imuran 50 mg p.o. daily.  Over-the-counter products for discussed at length.  She had dry oral mucosa.  Salivary gland massage was also discussed.  She has developed recent.  Discoloration on her toes which could be due to recurrence of vasculitis or Raynaud's phenomenon.  I did detailed discussion with the patient regarding obtaining labs to look for a flare of autoimmune disease.  Patient declined lab work today.  She would prefer to get labs with her next lab draw in March.  I discussed the risk of not obtaining the labs and not knowing if she is having a flare.  She stated that she will contact us if she develops new lesions.  She believes that her rash is improving.  Plan: Protein / creatinine ratio, urine, Anti-DNA antibody, double-stranded, C3 and C4, Sedimentation rate, Sjogrens syndrome-A extractable nuclear antibody, Pan-ANCA  High risk medication use - Imuran 50 mg by mouth daily. (PLQ was discontinued and June due to lymphopenia). -Labs obtained on December 26, 2021 were reviewed.  CBC showed WBC count of 4.8.  She still had lymphopenia with absolute lymphocyte count 547.  CMP showed mild AST elevation of  39.Her AST has been mildly elevated and stable.  Plan: CBC with Differential/Platelet, COMPLETE METABOLIC PANEL WITH GFR she will have repeat labs in March.  Vasculitis ([place]) - No recurrence since July 2020.  She recently developed a rash on her toes.  I am uncertain if it is due to vasculitis or due to Raynauds.  She does not want to have labs today.  Patient stated that she will contact us if her rash gets worse.  She understands the risk of vasculitis and his complications.  Raynaud's syndrome without gangrene - she has intermittent symptoms of Raynaud's mostly during the winter months.  She states her toes stay cold and she gets discoloration from Raynaud's phenomenon.  She could not take Norvasc due to low blood pressure.  Keeping core temperature warm and warm clothing was discussed.  Lymphopenia - she was evaluated by Dr.Kale in the past and was advised that lymphopenia is related to medications and autoimmune disease.  Primary osteoarthritis of both hands-she has bilateral PIP and DIP thickening.  Joint protection muscle strengthening was discussed.  No synovitis was noted.  Scoliosis  History of hypothyroidism  Osteopenia of multiple sites - July 11, 2017 DEXA scan showed osteopenia with T score -1.3 and right femoral neck.  She plans to get DEXA scan through her GYN.  Calcium rich diet and vitamin D was emphasized.  Orders: Orders Placed This Encounter  Procedures   Protein / creatinine ratio, urine   CBC with Differential/Platelet   COMPLETE METABOLIC PANEL WITH GFR   Anti-DNA antibody, double-stranded   C3 and C4   Sedimentation rate   Sjogrens syndrome-A extractable nuclear antibody   Pan-ANCA   No orders of the defined types were placed in this encounter.    Follow-Up Instructions: Return in about 5 months (around 07/17/2022) for Sjogren's.   Bo Merino, MD  Note - This record has been created using Editor, commissioning.  Chart creation errors have been sought, but  may not always  have been located. Such creation errors do not reflect on  the standard of medical care.

## 2022-02-09 ENCOUNTER — Other Ambulatory Visit: Payer: Self-pay | Admitting: Family Medicine

## 2022-02-09 ENCOUNTER — Encounter: Payer: Self-pay | Admitting: Podiatry

## 2022-02-09 ENCOUNTER — Ambulatory Visit (INDEPENDENT_AMBULATORY_CARE_PROVIDER_SITE_OTHER): Payer: PPO | Admitting: Podiatry

## 2022-02-09 ENCOUNTER — Other Ambulatory Visit: Payer: Self-pay | Admitting: Physician Assistant

## 2022-02-09 DIAGNOSIS — I7300 Raynaud's syndrome without gangrene: Secondary | ICD-10-CM

## 2022-02-09 MED ORDER — AZATHIOPRINE 50 MG PO TABS
50.0000 mg | ORAL_TABLET | Freq: Every day | ORAL | 0 refills | Status: DC
  Filled 2022-02-09 – 2022-02-13 (×2): qty 90, 90d supply, fill #0

## 2022-02-09 NOTE — Telephone Encounter (Signed)
Next Visit: 02/16/2022   Last Visit: 08/16/2021   Last Fill: 12/26/2021 (30 day supply)   DX: Sjogren's syndrome with other organ involvement    Current Dose per office note 08/16/2021: Imuran 50 mg by mouth daily  Labs: 12/26/2021 Lymphocyte count is low due to immunosuppression.  Liver function is mildly elevated and stable.   Okay to refill Imuran?

## 2022-02-09 NOTE — Progress Notes (Signed)
Subjective:  Patient ID: Jennifer Barr, female    DOB: March 20, 1955,  MRN: [id number] HPI Chief Complaint  Patient presents with   Toe Pain    Toes bilateral - red splotches on toes x 3 weeks, numbness and tingling x 3 years, sometimes look very dark, does have some auto immune diseases   New Patient (Initial Visit)    67 y.o. female presents with the above complaint.   ROS: Denies fever chills nausea vomit muscle aches pains calf pain back pain chest pain shortness of breath  Past Medical History:  Diagnosis Date   HSV infection    Osteopenia 07/2017   T score -1.3 FRAX 7.2% / 0.6%   Premature ovarian failure    Sjoegren syndrome    Dr. Estanislado Pandy   Thyroid disease    Hashiomto   Vasculitis ([place])    Dr. Estanislado Pandy   No past surgical history on file.  Current Outpatient Medications:    azaTHIOprine (IMURAN) 50 MG tablet, Take 1 tablet by mouth daily., Disp: 30 tablet, Rfl: 0   Ascorbic Acid (VITAMIN C PO), Take by mouth daily., Disp: , Rfl:    aspirin 81 MG tablet, Take 81 mg by mouth daily., Disp: , Rfl:    B Complex Vitamins (VITAMIN B COMPLEX PO), Take by mouth daily., Disp: , Rfl:    Calcium Carbonate-Vit D-Min (CALCIUM 1200 PO), Take by mouth., Disp: , Rfl:    MAGNESIUM PO, Take by mouth daily., Disp: , Rfl:    SYNTHROID 75 MCG tablet, Take 1 tablet (75 mcg total) by mouth daily before breakfast., Disp: 90 tablet, Rfl: 4   TYRVAYA 0.03 MG/ACT SOLN, Place 1 spray into both nostrils 2 (two) times daily., Disp: , Rfl:    VITAMIN D PO, Take by mouth daily., Disp: , Rfl:   Allergies  Allergen Reactions   Sulfa Antibiotics Rash   Review of Systems Objective:  There were no vitals filed for this visit.  General: Well developed, nourished, in no acute distress, alert and oriented x3   Dermatological: Skin is warm, dry and supple bilateral. Nails x 10 are well maintained; remaining integument appears unremarkable at this time. There are no open sores, no preulcerative  lesions, no rash or signs of infection present.  Her digits do demonstrate hyperpigmentation consistent with vascular disease or a chilblains type complication to the digits.  This is often seen in Raynaud's or autoimmune type diseases even as hyperglobulinemia is that demonstrate in the toes.  Vascular: Dorsalis Pedis artery and Posterior Tibial artery pedal pulses are 2/4 bilateral with immedate capillary fill time. Pedal hair growth present. No varicosities and no lower extremity edema present bilateral.  Hyperpigmentation blanches with exception of the area on the left hallux  Neruologic: Grossly intact via light touch bilateral. Vibratory intact via tuning fork bilateral. Protective threshold with Semmes Wienstein monofilament intact to all pedal sites bilateral. Patellar and Achilles deep tendon reflexes 2+ bilateral. No Babinski or clonus noted bilateral.  Hypersensitive toes on palpation.  Musculoskeletal: No gross boney pedal deformities bilateral. No pain, crepitus, or limitation noted with foot and ankle range of motion bilateral. Muscular strength 5/5 in all groups tested bilateral.  Gait: Unassisted, Nonantalgic.    Radiographs:  None taken  Assessment & Plan:   Assessment: Most likely this is Raynaud's syndrome and neuropathy.  Plan: We did discuss starting her on gabapentin which she would like to hold off on.  We did discuss alpha lipoic acid which she is going to  try.  Also discussed trying to keep the digits warmer.  We also mentioned possible vasodilatory agents such as Nitrostat.  She would like to try nonmedicated route first.     Jahaan Vanwagner T. [place]ink, [place]

## 2022-02-10 ENCOUNTER — Other Ambulatory Visit (HOSPITAL_COMMUNITY): Payer: Self-pay

## 2022-02-13 ENCOUNTER — Other Ambulatory Visit (HOSPITAL_COMMUNITY): Payer: Self-pay

## 2022-02-13 ENCOUNTER — Other Ambulatory Visit: Payer: Self-pay

## 2022-02-16 ENCOUNTER — Encounter: Payer: Self-pay | Admitting: Rheumatology

## 2022-02-16 ENCOUNTER — Ambulatory Visit: Payer: PPO | Attending: Rheumatology | Admitting: Rheumatology

## 2022-02-16 VITALS — BP 107/80 | HR 81 | Resp 16 | Ht 67.75 in | Wt 154.0 lb

## 2022-02-16 DIAGNOSIS — M19041 Primary osteoarthritis, right hand: Secondary | ICD-10-CM | POA: Diagnosis not present

## 2022-02-16 DIAGNOSIS — M419 Scoliosis, unspecified: Secondary | ICD-10-CM | POA: Diagnosis not present

## 2022-02-16 DIAGNOSIS — D72810 Lymphocytopenia: Secondary | ICD-10-CM

## 2022-02-16 DIAGNOSIS — I7300 Raynaud's syndrome without gangrene: Secondary | ICD-10-CM

## 2022-02-16 DIAGNOSIS — M8589 Other specified disorders of bone density and structure, multiple sites: Secondary | ICD-10-CM | POA: Diagnosis not present

## 2022-02-16 DIAGNOSIS — I776 Arteritis, unspecified: Secondary | ICD-10-CM | POA: Diagnosis not present

## 2022-02-16 DIAGNOSIS — M3509 Sicca syndrome with other organ involvement: Secondary | ICD-10-CM | POA: Diagnosis not present

## 2022-02-16 DIAGNOSIS — Z8639 Personal history of other endocrine, nutritional and metabolic disease: Secondary | ICD-10-CM

## 2022-02-16 DIAGNOSIS — Z79899 Other long term (current) drug therapy: Secondary | ICD-10-CM | POA: Diagnosis not present

## 2022-02-16 DIAGNOSIS — M19042 Primary osteoarthritis, left hand: Secondary | ICD-10-CM

## 2022-02-16 NOTE — Patient Instructions (Signed)
Standing Labs We placed an order today for your standing lab work.   Please have your standing labs drawn in March and every 3 months  Please have your labs drawn 2 weeks prior to your appointment so that the provider can discuss your lab results at your appointment.  Please note that you may see your imaging and lab results in [place] before we have reviewed them. We will contact you once all results are reviewed. Please allow our office up to 72 hours to thoroughly review all of the results before contacting the office for clarification of your results.  Lab hours are:   Monday through Thursday from 8:00 am -12:30 pm and 1:00 pm-5:00 pm and Friday from 8:00 am-12:00 pm.  Please be advised, all patients with office appointments requiring lab work will take precedent over walk-in lab work.   Labs are drawn by [facility]. Please bring your co-pay at the time of your lab draw.  You may receive a bill from [facility] for your lab work.  Please note if you are on Hydroxychloroquine and and an order has been placed for a Hydroxychloroquine level, you will need to have it drawn 4 hours or more after your last dose.  If you wish to have your labs drawn at another location, please call the office 24 hours in advance so we can fax the orders.  The office is located at [address], [place], [place], [place] [address] No appointment is necessary.    If you have any questions regarding directions or hours of operation,  please call [phone].   As a reminder, please drink plenty of water prior to coming for your lab work. Thanks!   Vaccines You are taking a medication(s) that can suppress your immune system.  The following immunizations are recommended: Flu annually Covid-19  Td/Tdap (tetanus, diphtheria, pertussis) every 10 years Pneumonia (Prevnar 15 then Pneumovax 23 at least 1 year apart.  Alternatively, can take Prevnar 20 without needing additional dose) Shingrix: 2 doses from 4 weeks to  6 months apart  Please check with your PCP to make sure you are up to date.

## 2022-03-06 IMAGING — MG DIGITAL SCREENING BILAT W/ TOMO W/ CAD
8 series · 9 of 24 positions shown · non-contrast
Comparison: Previous exam(s).

CLINICAL DATA: Screening.

EXAM:
DIGITAL SCREENING BILATERAL MAMMOGRAM WITH TOMO AND CAD

[L CC synth-2D]
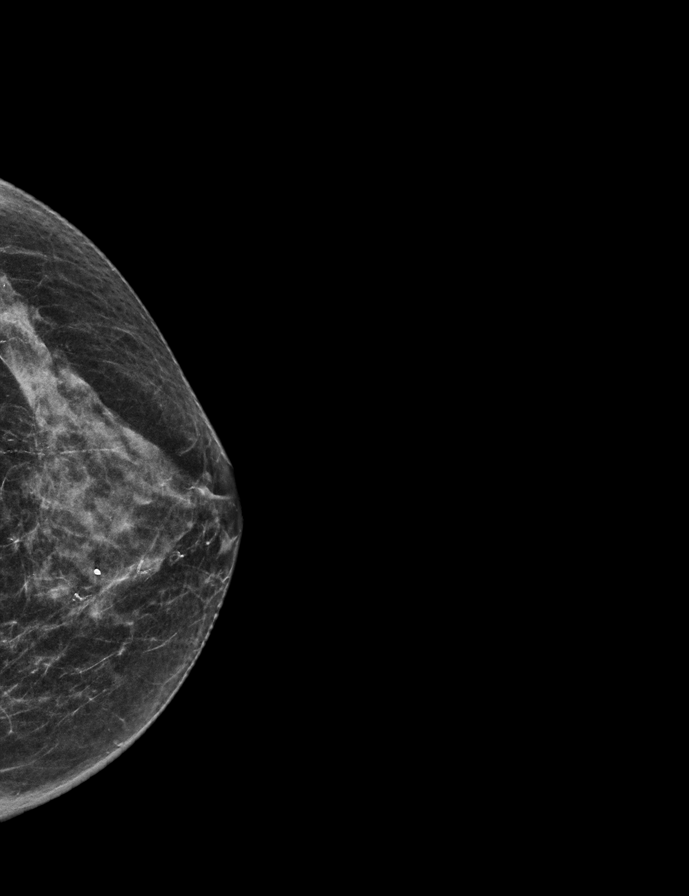

[L MLO synth-2D]
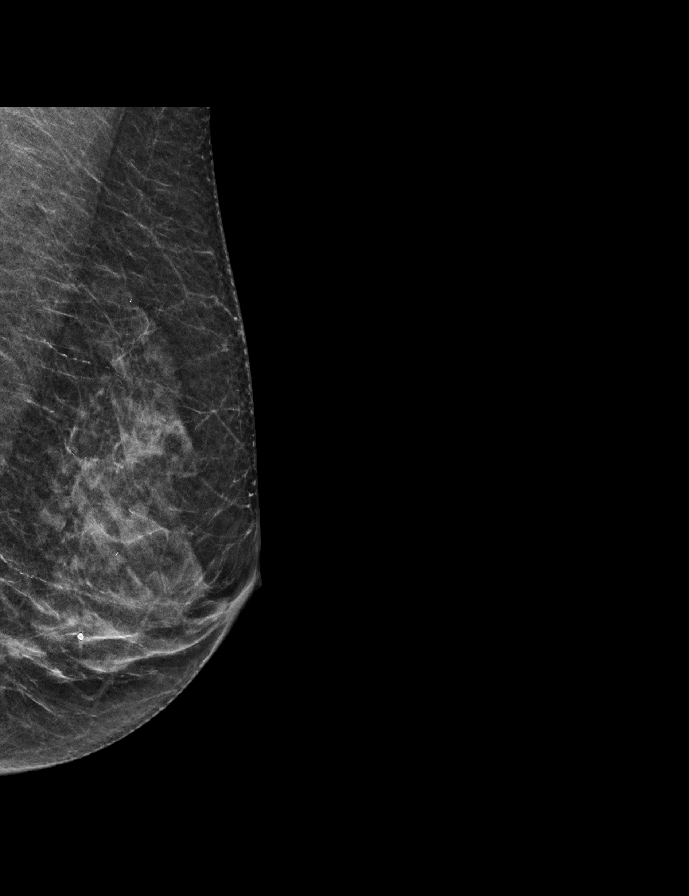

[R CC synth-2D]
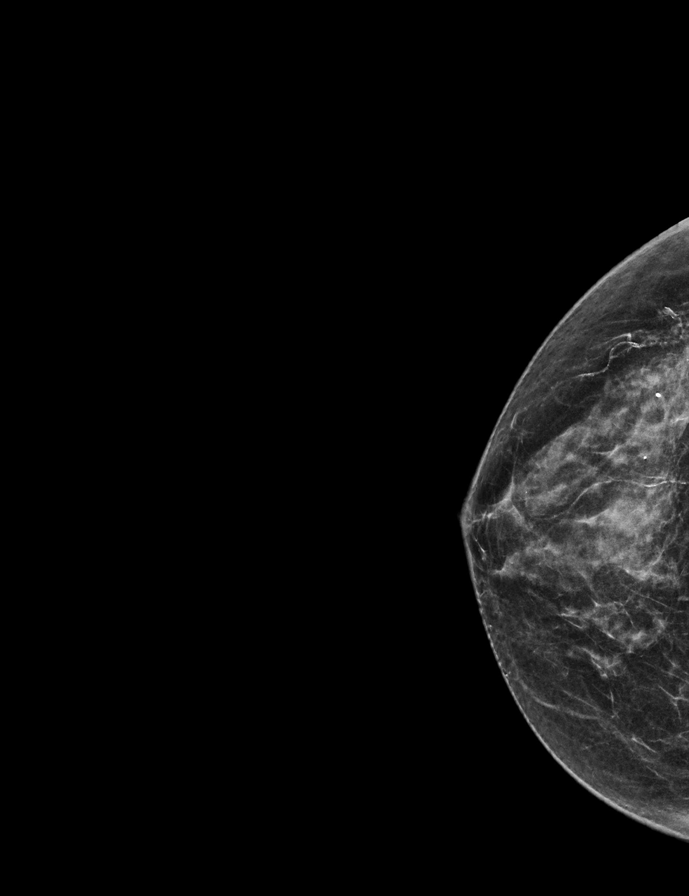

[R MLO synth-2D]
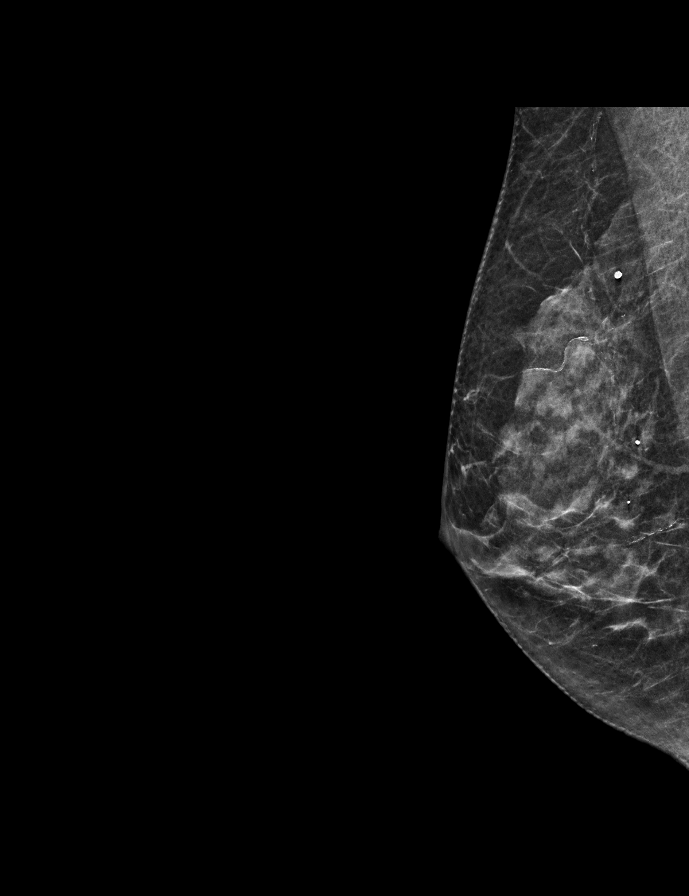

[R MLO tomo · 2 of 49 frames shown]
[frame 16/49]
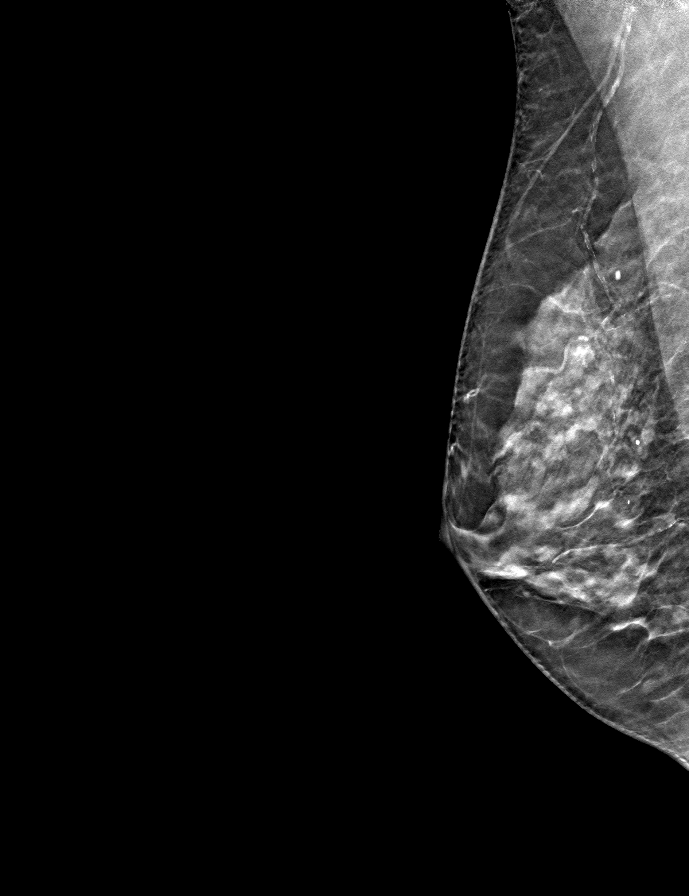
[frame 25/49]
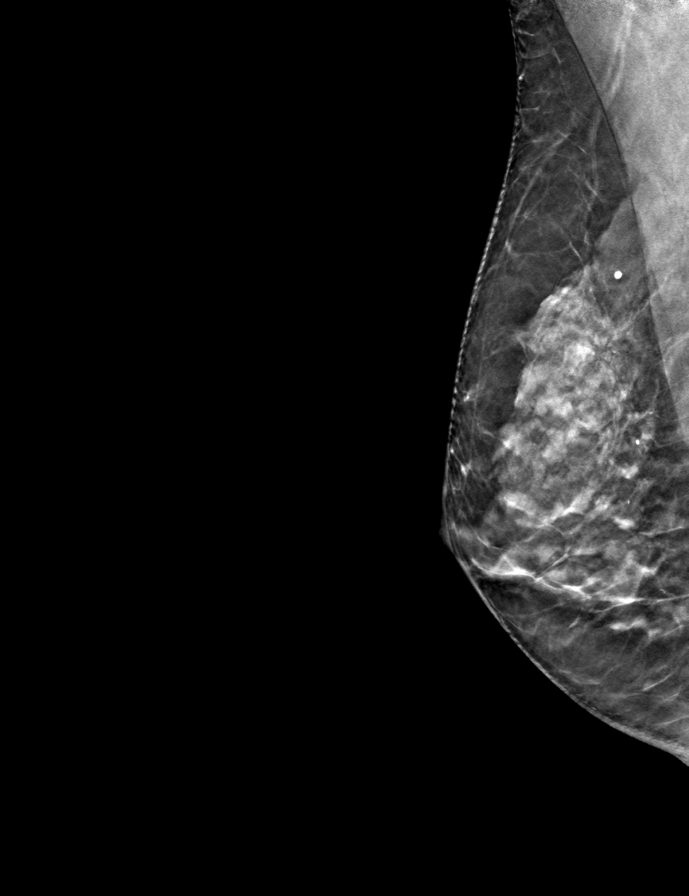

[L CC tomo · tomo slice 25/49.0]
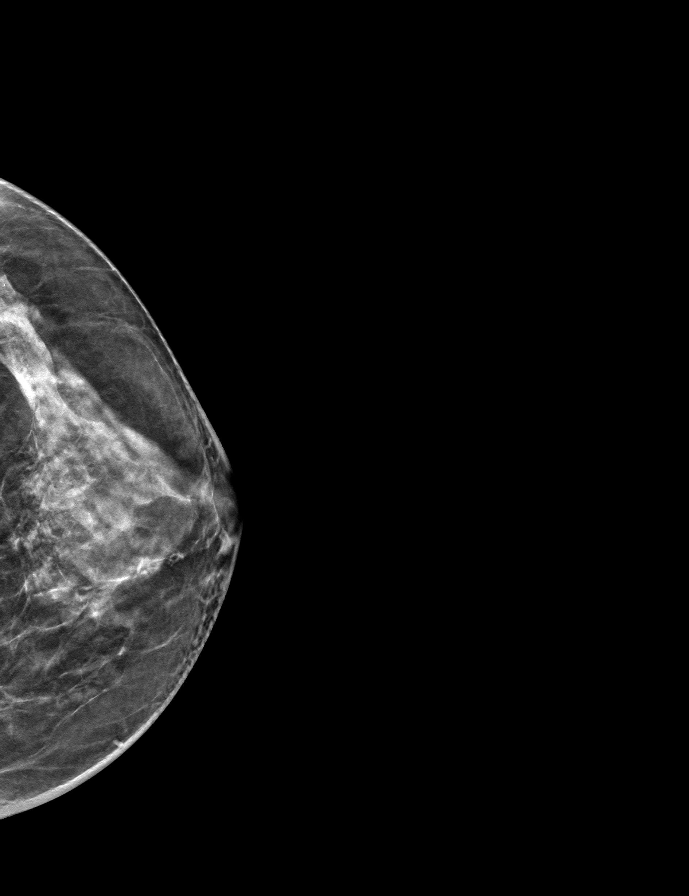

[R CC tomo · tomo slice 25/49.0]
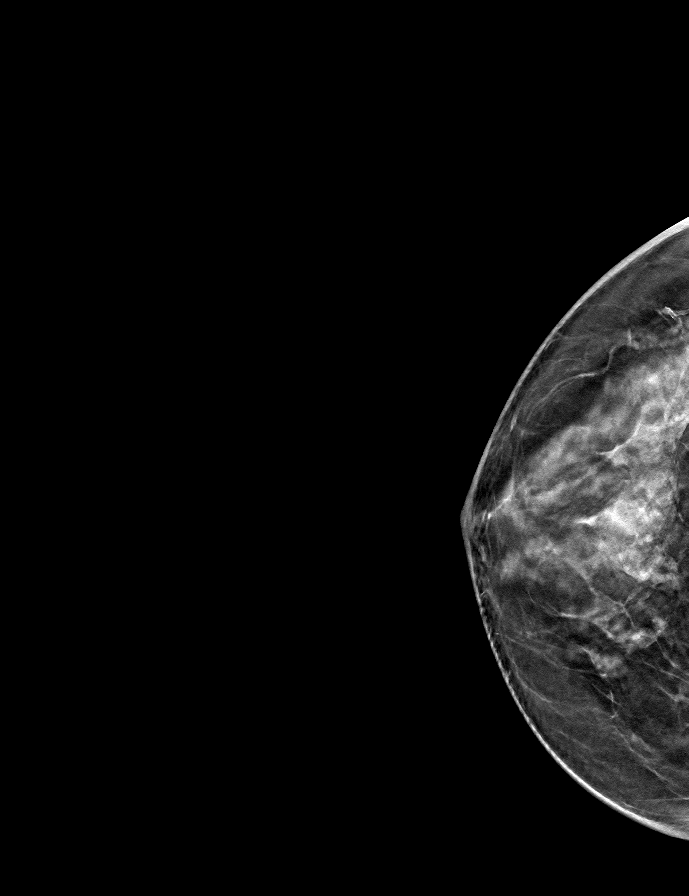

[L MLO tomo · tomo slice 23/45.0]
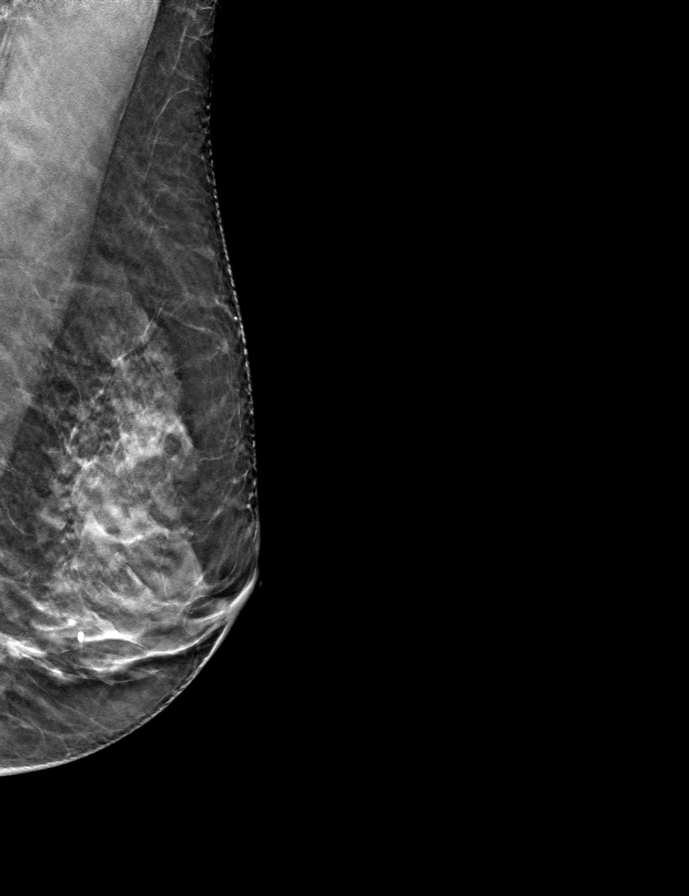

[9 of 24 positions shown; findings below may reference images not displayed]

ACR Breast Density Category c: The breast tissue is heterogeneously
dense, which may obscure small masses.
FINDINGS: There are no findings suspicious for malignancy. Images were
processed with CAD.
IMPRESSION: No mammographic evidence of malignancy. A result letter of this
screening mammogram will be mailed directly to the patient.

RECOMMENDATION:
Screening mammogram in one year. (Code:FT-U-LHB)

BI-RADS CATEGORY  1: Negative.

## 2022-03-14 ENCOUNTER — Telehealth: Payer: Self-pay | Admitting: Family Medicine

## 2022-03-14 NOTE — Telephone Encounter (Signed)
Copied from [place] [phone] [phone]. Topic: Medicare AWV >> Mar 14, 2022 10:16 AM Gillis Santa wrote: Reason for CRM: Called patient to schedule Medicare Annual Wellness Visit (AWV). Left message for patient to call back and schedule Medicare Annual Wellness Visit (AWV).  Last date of AWV: N/A  Please schedule an appointment at any time with Otila Kluver, [facility].  If any questions, please contact me at [phone].  Thank you ,  Shaune Pollack [facility] AWV TEAM Direct Dial [phone]

## 2022-03-16 ENCOUNTER — Ambulatory Visit (INDEPENDENT_AMBULATORY_CARE_PROVIDER_SITE_OTHER): Payer: PPO

## 2022-03-16 VITALS — BP 120/80 | HR 84 | Temp 97.7°F | Wt 148.8 lb

## 2022-03-16 DIAGNOSIS — Z0000 Encounter for general adult medical examination without abnormal findings: Secondary | ICD-10-CM | POA: Diagnosis not present

## 2022-03-16 NOTE — Patient Instructions (Signed)
Jennifer Barr , Thank you for taking time to come for your Medicare Wellness Visit. I appreciate your ongoing commitment to your health goals. Please review the following plan we discussed and let me know if I can assist you in the future.   These are the goals we discussed:  Goals   None     This is a list of the screening recommended for you and due dates:  Health Maintenance  Topic Date Due   COVID-19 Vaccine (4 - 2023-24 season) 09/09/2021   Medicare Annual Wellness Visit  03/16/2023   Mammogram  09/02/2023   Colon Cancer Screening  12/15/2027   DTaP/Tdap/Td vaccine (2 - Td or Tdap) 06/05/2028   Pneumonia Vaccine  Completed   Flu Shot  Completed   DEXA scan (bone density measurement)  Completed   Hepatitis C Screening: USPSTF Recommendation to screen - Ages 45-79 yo.  Completed   Zoster (Shingles) Vaccine  Completed   HPV Vaccine  Aged Out    Advanced directives: Please bring a copy of your health care power of attorney and living will to the office at your convenience.  Conditions/risks identified: continue exercise   Next appointment: Follow up in one year for your annual wellness visit    Preventive Care 65 Years and Older, Female Preventive care refers to lifestyle choices and visits with your health care provider that can promote health and wellness. What does preventive care include? A yearly physical exam. This is also called an annual well check. Dental exams once or twice a year. Routine eye exams. Ask your health care provider how often you should have your eyes checked. Personal lifestyle choices, including: Daily care of your teeth and gums. Regular physical activity. Eating a healthy diet. Avoiding tobacco and drug use. Limiting alcohol use. Practicing safe sex. Taking low-dose aspirin every day. Taking vitamin and mineral supplements as recommended by your health care provider. What happens during an annual well check? The services and screenings done by  your health care provider during your annual well check will depend on your age, overall health, lifestyle risk factors, and family history of disease. Counseling  Your health care provider may ask you questions about your: Alcohol use. Tobacco use. Drug use. Emotional well-being. Home and relationship well-being. Sexual activity. Eating habits. History of falls. Memory and ability to understand (cognition). Work and work Statistician. Reproductive health. Screening  You may have the following tests or measurements: Height, weight, and BMI. Blood pressure. Lipid and cholesterol levels. These may be checked every 5 years, or more frequently if you are over 11 years old. Skin check. Lung cancer screening. You may have this screening every year starting at age 48 if you have a 30-pack-year history of smoking and currently smoke or have quit within the past 15 years. Fecal occult blood test (FOBT) of the stool. You may have this test every year starting at age 2. Flexible sigmoidoscopy or colonoscopy. You may have a sigmoidoscopy every 5 years or a colonoscopy every 10 years starting at age 71. Hepatitis C blood test. Hepatitis B blood test. Sexually transmitted disease (STD) testing. Diabetes screening. This is done by checking your blood sugar (glucose) after you have not eaten for a while (fasting). You may have this done every 1-3 years. Bone density scan. This is done to screen for osteoporosis. You may have this done starting at age 86. Mammogram. This may be done every 1-2 years. Talk to your health care provider about how often you  should have regular mammograms. Talk with your health care provider about your test results, treatment options, and if necessary, the need for more tests. Vaccines  Your health care provider may recommend certain vaccines, such as: Influenza vaccine. This is recommended every year. Tetanus, diphtheria, and acellular pertussis (Tdap, Td) vaccine. You  may need a Td booster every 10 years. Zoster vaccine. You may need this after age 54. Pneumococcal 13-valent conjugate (PCV13) vaccine. One dose is recommended after age 79. Pneumococcal polysaccharide (PPSV23) vaccine. One dose is recommended after age 58. Talk to your health care provider about which screenings and vaccines you need and how often you need them. This information is not intended to replace advice given to you by your health care provider. Make sure you discuss any questions you have with your health care provider. Document Released: 01/22/2015 Document Revised: 09/15/2015 Document Reviewed: 10/27/2014 Elsevier Interactive Patient Education  2017 [employer] Prevention in the Home Falls can cause injuries. They can happen to people of all ages. There are many things you can do to make your home safe and to help prevent falls. What can I do on the outside of my home? Regularly fix the edges of walkways and driveways and fix any cracks. Remove anything that might make you trip as you walk through a door, such as a raised step or threshold. Trim any bushes or trees on the path to your home. Use bright outdoor lighting. Clear any walking paths of anything that might make someone trip, such as rocks or tools. Regularly check to see if handrails are loose or broken. Make sure that both sides of any steps have handrails. Any raised decks and porches should have guardrails on the edges. Have any leaves, snow, or ice cleared regularly. Use sand or salt on walking paths during winter. Clean up any spills in your garage right away. This includes oil or grease spills. What can I do in the bathroom? Use night lights. Install grab bars by the toilet and in the tub and shower. Do not use towel bars as grab bars. Use non-skid mats or decals in the tub or shower. If you need to sit down in the shower, use a plastic, non-slip stool. Keep the floor dry. Clean up any water that spills  on the floor as soon as it happens. Remove soap buildup in the tub or shower regularly. Attach bath mats securely with double-sided non-slip rug tape. Do not have throw rugs and other things on the floor that can make you trip. What can I do in the bedroom? Use night lights. Make sure that you have a light by your bed that is easy to reach. Do not use any sheets or blankets that are too big for your bed. They should not hang down onto the floor. Have a firm chair that has side arms. You can use this for support while you get dressed. Do not have throw rugs and other things on the floor that can make you trip. What can I do in the kitchen? Clean up any spills right away. Avoid walking on wet floors. Keep items that you use a lot in easy-to-reach places. If you need to reach something above you, use a strong step stool that has a grab bar. Keep electrical cords out of the way. Do not use floor polish or wax that makes floors slippery. If you must use wax, use non-skid floor wax. Do not have throw rugs and other things on the  floor that can make you trip. What can I do with my stairs? Do not leave any items on the stairs. Make sure that there are handrails on both sides of the stairs and use them. Fix handrails that are broken or loose. Make sure that handrails are as long as the stairways. Check any carpeting to make sure that it is firmly attached to the stairs. Fix any carpet that is loose or worn. Avoid having throw rugs at the top or bottom of the stairs. If you do have throw rugs, attach them to the floor with carpet tape. Make sure that you have a light switch at the top of the stairs and the bottom of the stairs. If you do not have them, ask someone to add them for you. What else can I do to help prevent falls? Wear shoes that: Do not have high heels. Have rubber bottoms. Are comfortable and fit you well. Are closed at the toe. Do not wear sandals. If you use a stepladder: Make  sure that it is fully opened. Do not climb a closed stepladder. Make sure that both sides of the stepladder are locked into place. Ask someone to hold it for you, if possible. Clearly mark and make sure that you can see: Any grab bars or handrails. First and last steps. Where the edge of each step is. Use tools that help you move around (mobility aids) if they are needed. These include: Canes. Walkers. Scooters. Crutches. Turn on the lights when you go into a dark area. Replace any light bulbs as soon as they burn out. Set up your furniture so you have a clear path. Avoid moving your furniture around. If any of your floors are uneven, fix them. If there are any pets around you, be aware of where they are. Review your medicines with your doctor. Some medicines can make you feel dizzy. This can increase your chance of falling. Ask your doctor what other things that you can do to help prevent falls. This information is not intended to replace advice given to you by your health care provider. Make sure you discuss any questions you have with your health care provider. Document Released: 10/22/2008 Document Revised: 06/03/2015 Document Reviewed: 01/30/2014 Elsevier Interactive Patient Education  2017 [employer].

## 2022-03-16 NOTE — Progress Notes (Addendum)
Subjective:   Jennifer Barr is a 67 y.o. female who presents for Medicare Annual (Subsequent) preventive examination.    Patient Medicare AWV questionnaire was completed by the patient on 03/15/22; I have confirmed that all information answered by patient is correct and no changes since this date.       Review of Systems     Cardiac Risk Factors include: advanced age (>63mn, >>65women)     Objective:    Today's Vitals   03/16/22 0750  BP: 120/80  Pulse: 84  Temp: 97.7 F (36.5 C)  SpO2: 97%  Weight: 148 lb 12.8 oz (67.5 kg)   Body mass index is 22.79 kg/m.     03/16/2022    7:59 AM  Advanced Directives  Does Patient Have a Medical Advance Directive? Yes  Type of [place]  Copy of [place] Chart? No - copy requested    Current Medications (verified) Outpatient Encounter Medications as of 03/16/2022  Medication Sig   Ascorbic Acid (VITAMIN C PO) Take by mouth daily.   aspirin 81 MG tablet Take 81 mg by mouth daily.   azaTHIOprine (IMURAN) 50 MG tablet Take 1 tablet (50 mg total) by mouth daily.   B Complex Vitamins (VITAMIN B COMPLEX PO) Take by mouth daily.   Calcium Carbonate-Vit D-Min (CALCIUM 1200 PO) Take by mouth.   MAGNESIUM PO Take by mouth daily.   SYNTHROID 75 MCG tablet Take 1 tablet (75 mcg total) by mouth daily before breakfast.   TYRVAYA 0.03 MG/ACT SOLN Place 1 spray into both nostrils 2 (two) times daily.   VITAMIN D PO Take by mouth daily.   Homeopathic Products ([employer] NERVE PAIN RELIEF EX) Apply topically. OTC NERVIVE (Patient not taking: Reported on 03/16/2022)   No facility-administered encounter medications on file as of 03/16/2022.    Allergies (verified) Sulfa antibiotics   History: Past Medical History:  Diagnosis Date   HSV infection    Osteopenia 07/2017   T score -1.3 FRAX 7.2% / 0.6%   Premature ovarian failure    Sjoegren syndrome    Dr. DEstanislado Pandy  Thyroid  disease    Hashiomto   [place] Pandy  Past Surgical History:  Procedure Laterality Date   TUBAL LIGATION     Family History  Problem Relation Age of Onset   Diabetes Mother    Diabetes Father    Scleroderma Daughter    Social History   Socioeconomic History   Marital status: Married    Spouse name: Not on file   Number of children: Not on file   Years of education: Not on file   Highest education level: Not on file  Occupational History   Not on file  Tobacco Use   Smoking status: Never    Passive exposure: Never   Smokeless tobacco: Never  Vaping Use   Vaping Use: Never used  Substance and Sexual Activity   Alcohol use: Not Currently   Drug use: Never   Sexual activity: Yes    Partners: Male    Birth control/protection: Post-menopausal    Comment: 1st itnercourse-18, partners- 1  Other Topics Concern   Not on file  Social History Narrative   Not on file   Social Determinants of Health   Financial Resource Strain: Low Risk  (03/15/2022)   Overall Financial Resource Strain (CARDIA)    Difficulty of Paying Living Expenses: Not hard at  all  Food Insecurity: No Food Insecurity (03/15/2022)   Hunger Vital Sign    Worried About Running Out of Food in the Last Year: Never true    Ran Out of Food in the Last Year: Never true  Transportation Needs: No Transportation Needs (03/15/2022)   PRAPARE - Hydrologist (Medical): No    Lack of Transportation (Non-Medical): No  Physical Activity: Sufficiently Active (03/15/2022)   Exercise Vital Sign    Days of Exercise per Week: 6 days    Minutes of Exercise per Session: 50 min  Stress: No Stress Concern Present (03/15/2022)   [place]    Feeling of Stress : Only a little  Social Connections: Socially Integrated (03/15/2022)   Social Connection and Isolation Panel [NHANES]    Frequency of Communication with Friends  and Family: More than three times a week    Frequency of Social Gatherings with Friends and Family: Twice a week    Attends Religious Services: More than 4 times per year    Active Member of [employer] or Organizations: Yes    Attends Music therapist: More than 4 times per year    Marital Status: Married    Tobacco Counseling Counseling given: Not Answered   Clinical Intake:  Pre-visit preparation completed: Yes  Pain : No/denies pain     BMI - recorded: 22.79 Nutritional Status: BMI of 19-24  Normal Nutritional Risks: None Diabetes: No  How often do you need to have someone help you when you read instructions, pamphlets, or other written materials from your doctor or pharmacy?: 1 - Never  Diabetic?no  Interpreter Needed?: No  Information entered by :: Charlott Rakes, LPN   Activities of Daily Living    03/15/2022   11:02 AM  In your present state of health, do you have any difficulty performing the following activities:  Hearing? 0  Vision? 0  Difficulty concentrating or making decisions? 0  Walking or climbing stairs? 0  Dressing or bathing? 0  Doing errands, shopping? 0  Preparing Food and eating ? N  Using the Toilet? N  In the past six months, have you accidently leaked urine? N  Do you have problems with loss of bowel control? N  Managing your Medications? N  Managing your Finances? N  Housekeeping or managing your Housekeeping? N    Patient Care Team: [name], MD as PCP - General (Family Medicine)  Indicate any recent Medical Services you may have received from other than [facility] providers in the past year (date may be approximate).     Assessment:   This is a routine wellness examination for [name].  Hearing/Vision screen Hearing Screening - Comments:: Pt denies any hearing issues  Vision Screening - Comments:: Pt follows up with [facility] for annual eye exam   Dietary issues and exercise activities discussed: Current Exercise  Habits: Home exercise routine, Type of exercise: Other - see comments, Time (Minutes): 50, Frequency (Times/Week): 6, Weekly Exercise (Minutes/Week): 300   Goals Addressed             This Visit's Progress    Patient Stated       Continue to exercise        Depression Screen    03/16/2022    7:58 AM 12/14/2020    1:28 PM  PHQ 2/9 Scores  PHQ - 2 Score 0 0    Fall Risk  03/15/2022   11:02 AM 12/14/2020    1:28 PM  Fall Risk   Falls in the past year? 0 0  Number falls in past yr: 0 0  Injury with Fall? 0 0  Risk for fall due to : Impaired vision   Follow up Falls prevention discussed     FALL RISK PREVENTION PERTAINING TO THE HOME:  Any stairs in or around the home? Yes  If so, are there any without handrails? No  Home free of loose throw rugs in walkways, pet beds, electrical cords, etc? Yes  Adequate lighting in your home to reduce risk of falls? Yes   ASSISTIVE DEVICES UTILIZED TO PREVENT FALLS:  Life alert? No  Use of a cane, walker or w/c? No  Grab bars in the bathroom? No  Shower chair or bench in shower? No  Elevated toilet seat or a handicapped toilet? No   TIMED UP AND GO:  Was the test performed? Yes .  Length of time to ambulate 10 feet: 10 sec.   Gait steady and fast without use of assistive device  Cognitive Function:        03/16/2022    8:02 AM  6CIT Screen  What Year? 0 points  What month? 0 points  What time? 0 points  Count back from 20 0 points  Months in reverse 4 points  Repeat phrase 0 points  Total Score 4 points    Immunizations Immunization History  Administered Date(s) Administered   Fluad Quad(high Dose 65+) 10/19/2021   Influenza Inj Mdck Quad Pf 10/25/2020   Influenza, High Dose Seasonal PF 10/14/2018   Influenza,inj,Quad PF,6+ Mos 10/14/2018   Influenza-Unspecified 10/25/2020   Moderna Sars-Covid-2 Vaccination 03/25/2019, 04/22/2019, 09/01/2019   PNEUMOCOCCAL CONJUGATE-20 10/19/2021   Tdap 06/06/2018   Zoster  Recombinat (Shingrix) 10/25/2020, 03/01/2021    TDAP status: Up to date  Flu Vaccine status: Up to date  Pneumococcal vaccine status: Up to date  Covid-19 vaccine status: Completed vaccines   Qualifies for Shingles Vaccine? Yes   Zostavax completed Yes   Shingrix Completed?: Yes  Screening Tests Health Maintenance  Topic Date Due   COVID-19 Vaccine (4 - 2023-24 season) 09/09/2021   Medicare Annual Wellness (AWV)  03/16/2023   MAMMOGRAM  09/02/2023   COLONOSCOPY (Pts 45-15yr Insurance coverage will need to be confirmed)  12/15/2027   DTaP/Tdap/Td (2 - Td or Tdap) 06/05/2028   Pneumonia Vaccine 66 Years old  Completed   INFLUENZA VACCINE  Completed   DEXA SCAN  Completed   Hepatitis C Screening  Completed   Zoster Vaccines- Shingrix  Completed   HPV VACCINES  Aged Out    Health Maintenance  Health Maintenance Due  Topic Date Due   COVID-19 Vaccine (4 - 2023-24 season) 09/09/2021    Colorectal cancer screening: Type of screening: Colonoscopy. Completed 12/14/17. Repeat every 10 years  Mammogram status: Completed 09/01/21. Repeat every year  Bone Density status: Completed 09/28/21. Results reflect: Bone density results: OSTEOPENIA. Repeat every 2 years.   Additional Screening:  Hepatitis C Screening:  Completed 09/01/19  Vision Screening: Recommended annual ophthalmology exams for early detection of glaucoma and other disorders of the eye. Is the patient up to date with their annual eye exam?  Yes  Who is the provider or what is the name of the office in which the patient attends annual eye exams[place] If pt is not established with a provider, would they like to be referred to a provider to  establish care? No .   Dental Screening: Recommended annual dental exams for proper oral hygiene  Community Resource Referral / Chronic Care Management: CRR required this visit?  No   CCM required this visit?  No      Plan:     I have personally reviewed and  noted the following in the patient's chart:   Medical and social history Use of alcohol, tobacco or illicit drugs  Current medications and supplements including opioid prescriptions. Patient is not currently taking opioid prescriptions. Functional ability and status Nutritional status Physical activity Advanced directives List of other physicians Hospitalizations, surgeries, and ER visits in previous 12 months Vitals Screenings to include cognitive, depression, and falls Referrals and appointments  In addition, I have reviewed and discussed with patient certain preventive protocols, quality metrics, and best practice recommendations. A written personalized care plan for preventive services as well as general preventive health recommendations were provided to patient.     Willette Brace, LPN   [phone]   Nurse Notes: none

## 2022-03-23 ENCOUNTER — Ambulatory Visit (INDEPENDENT_AMBULATORY_CARE_PROVIDER_SITE_OTHER): Payer: PPO | Admitting: Family Medicine

## 2022-03-23 ENCOUNTER — Other Ambulatory Visit: Payer: Self-pay | Admitting: *Deleted

## 2022-03-23 ENCOUNTER — Encounter: Payer: Self-pay | Admitting: Family Medicine

## 2022-03-23 ENCOUNTER — Other Ambulatory Visit (HOSPITAL_COMMUNITY): Payer: Self-pay

## 2022-03-23 VITALS — BP 122/75 | HR 63 | Temp 97.8°F | Ht 67.0 in | Wt 148.4 lb

## 2022-03-23 DIAGNOSIS — Z0001 Encounter for general adult medical examination with abnormal findings: Secondary | ICD-10-CM

## 2022-03-23 DIAGNOSIS — E063 Autoimmune thyroiditis: Secondary | ICD-10-CM

## 2022-03-23 DIAGNOSIS — I776 Arteritis, unspecified: Secondary | ICD-10-CM

## 2022-03-23 DIAGNOSIS — M3509 Sicca syndrome with other organ involvement: Secondary | ICD-10-CM

## 2022-03-23 DIAGNOSIS — Z79899 Other long term (current) drug therapy: Secondary | ICD-10-CM

## 2022-03-23 DIAGNOSIS — D72810 Lymphocytopenia: Secondary | ICD-10-CM

## 2022-03-23 DIAGNOSIS — M3500 Sicca syndrome, unspecified: Secondary | ICD-10-CM | POA: Diagnosis not present

## 2022-03-23 DIAGNOSIS — Z131 Encounter for screening for diabetes mellitus: Secondary | ICD-10-CM | POA: Diagnosis not present

## 2022-03-23 DIAGNOSIS — Z13220 Encounter for screening for lipoid disorders: Secondary | ICD-10-CM

## 2022-03-23 DIAGNOSIS — B001 Herpesviral vesicular dermatitis: Secondary | ICD-10-CM | POA: Insufficient documentation

## 2022-03-23 DIAGNOSIS — I7300 Raynaud's syndrome without gangrene: Secondary | ICD-10-CM

## 2022-03-23 MED ORDER — POLYMYXIN B-TRIMETHOPRIM 10000-0.1 UNIT/ML-% OP SOLN
2.0000 [drp] | OPHTHALMIC | 0 refills | Status: DC
  Filled 2022-03-23: qty 10, 9d supply, fill #0

## 2022-03-23 MED ORDER — ACYCLOVIR 5 % EX OINT
1.0000 | TOPICAL_OINTMENT | CUTANEOUS | 1 refills | Status: AC
  Filled 2022-03-23: qty 15, 5d supply, fill #0
  Filled 2022-03-23: qty 15, 10d supply, fill #0
  Filled 2022-03-23: qty 15, 5d supply, fill #0

## 2022-03-23 NOTE — Assessment & Plan Note (Signed)
Last TSH at goal.  Continue Synthroid 75 mcg daily. 

## 2022-03-23 NOTE — Progress Notes (Signed)
Chief Complaint:  Jennifer Barr is a 67 y.o. female who presents today for her annual comprehensive physical exam.    Assessment/Plan:  Chronic Problems Addressed Today: Sjogren's disease ([place]) Follows with dermatology.  Overall symptoms are stable.  She does occasionally get conjunctivitis flareups and would like to have Polytrim to use on hand.  Will refill today.  Vasculitis Symptoms are improving.  She follows with rheumatology  Hashimoto's thyroiditis Last TSH at goal.  Continue Synthroid 75 mcg daily.  Cold sore No recent flare ups though would like to have acyclovir appointment on hand to use as needed.  Will refill today.  Preventative Healthcare: Will check lipids and A1c.  She gets other labs routinely checked via gynecology and rheumatology.  Up-to-date on vaccines.  Up-to-date on colon cancer screening.  Up-to-date on mammogram and bone density scan.  Patient Counseling(The following topics were reviewed and/or handout was given):  -Nutrition: Stressed importance of moderation in sodium/caffeine intake, saturated fat and cholesterol, caloric balance, sufficient intake of fresh fruits, vegetables, and fiber.  -Stressed the importance of regular exercise.   -Substance Abuse: Discussed cessation/primary prevention of tobacco, alcohol, or other drug use; driving or other dangerous activities under the influence; availability of treatment for abuse.   -Injury prevention: Discussed safety belts, safety helmets, smoke detector, smoking near bedding or upholstery.   -Sexuality: Discussed sexually transmitted diseases, partner selection, use of condoms, avoidance of unintended pregnancy and contraceptive alternatives.   -Dental health: Discussed importance of regular tooth brushing, flossing, and dental visits.  -Health maintenance and immunizations reviewed. Please refer to Health maintenance section.  Return to care in 1 year for next preventative visit.     Subjective:   HPI:  She has no acute complaints today.   Lifestyle Diet: Balanced. Plenty of fruits and vegetables.  Exercise: Walking. [employer] boot camp.      03/23/2022    1:49 PM  Depression screen PHQ 2/9  Decreased Interest 0  Down, Depressed, Hopeless 0  PHQ - 2 Score 0    There are no preventive care reminders to display for this patient.    ROS: Per HPI, otherwise a complete review of systems was negative.   PMH:  The following were reviewed and entered/updated in epic: Past Medical History:  Diagnosis Date   HSV infection    Osteopenia 07/2017   T score -1.3 FRAX 7.2% / 0.6%   Premature ovarian failure    Sjoegren syndrome    Dr. Estanislado Pandy   Thyroid disease    Hashiomto   Vasculitis ([place])    Dr. Estanislado Pandy   Patient Active Problem List   Diagnosis Date Noted   Cold sore 03/23/2022   Peripheral neuropathy 12/14/2020   Acute parotitis 12/03/2017   ANA positive 02/10/2016   Rheumatoid factor positive 02/10/2016   Primary osteoarthritis, right hand 02/10/2016   Scoliosis 02/10/2016   Raynaud's syndrome without gangrene 02/10/2016   Sjogren's disease ([place]) 05/07/2014   Vasculitis ([place]) 05/07/2014   Hashimoto's thyroiditis 05/07/2014   Past Surgical History:  Procedure Laterality Date   TUBAL LIGATION      Family History  Problem Relation Age of Onset   Diabetes Mother    Diabetes Father    Scleroderma Daughter     Medications- reviewed and updated Current Outpatient Medications  Medication Sig Dispense Refill   acyclovir ointment (ZOVIRAX) 5 % Apply 1 Application topically every 3 (three) hours. 30 g 1   Ascorbic Acid (VITAMIN C PO) Take by mouth daily.  aspirin 81 MG tablet Take 81 mg by mouth daily.     azaTHIOprine (IMURAN) 50 MG tablet Take 1 tablet (50 mg total) by mouth daily. 90 tablet 0   B Complex Vitamins (VITAMIN B COMPLEX PO) Take by mouth daily.     Calcium 200 MG TABS Take by mouth.     Calcium Carbonate-Vit D-Min (CALCIUM 1200 PO) Take by  mouth.     Homeopathic Products ([employer] NERVE PAIN RELIEF EX) Apply topically. OTC NERVIVE     Magnesium 80 MG TABS Take by mouth.     magnesium citrate solution Take 296 mLs by mouth once.     MAGNESIUM PO Take by mouth daily.     SYNTHROID 75 MCG tablet Take 1 tablet (75 mcg total) by mouth daily before breakfast. 90 tablet 4   trimethoprim-polymyxin b (POLYTRIM) ophthalmic solution Place 2 drops into both eyes every 4 (four) hours. 10 mL 0   TYRVAYA 0.03 MG/ACT SOLN Place 1 spray into both nostrils 2 (two) times daily.     VITAMIN D PO Take by mouth daily.     No current facility-administered medications for this visit.    Allergies-reviewed and updated Allergies  Allergen Reactions   Sulfa Antibiotics Rash    Social History   Socioeconomic History   Marital status: Married    Spouse name: Not on file   Number of children: Not on file   Years of education: Not on file   Highest education level: Not on file  Occupational History   Not on file  Tobacco Use   Smoking status: Never    Passive exposure: Never   Smokeless tobacco: Never  Vaping Use   Vaping Use: Never used  Substance and Sexual Activity   Alcohol use: Not Currently   Drug use: Never   Sexual activity: Yes    Partners: Male    Birth control/protection: Post-menopausal    Comment: 1st itnercourse-18, partners- 1  Other Topics Concern   Not on file  Social History Narrative   Not on file   Social Determinants of Health   Financial Resource Strain: Low Risk  (03/15/2022)   Overall Financial Resource Strain (CARDIA)    Difficulty of Paying Living Expenses: Not hard at all  Food Insecurity: No Food Insecurity (03/15/2022)   Hunger Vital Sign    Worried About Running Out of Food in the Last Year: Never true    [place] in the Last Year: Never true  Transportation Needs: No Transportation Needs (03/15/2022)   PRAPARE - Hydrologist (Medical): No    Lack of Transportation  (Non-Medical): No  Physical Activity: Sufficiently Active (03/15/2022)   Exercise Vital Sign    Days of Exercise per Week: 6 days    Minutes of Exercise per Session: 50 min  Stress: No Stress Concern Present (03/15/2022)   [place]    Feeling of Stress : Only a little  Social Connections: Socially Integrated (03/15/2022)   Social Connection and Isolation Panel [NHANES]    Frequency of Communication with Friends and Family: More than three times a week    Frequency of Social Gatherings with Friends and Family: Twice a week    Attends Religious Services: More than 4 times per year    Active Member of [employer] or Organizations: Yes    Attends Archivist Meetings: More than 4 times per year    Marital Status:  Married        Objective:  Physical Exam: BP 122/75   Pulse 63   Temp 97.8 F (36.6 C) (Temporal)   Ht '5\' 7"'$  (1.702 m)   Wt 148 lb 6.4 oz (67.3 kg)   SpO2 100%   BMI 23.24 kg/m   Body mass index is 23.24 kg/m. Wt Readings from Last 3 Encounters:  03/23/22 148 lb 6.4 oz (67.3 kg)  03/16/22 148 lb 12.8 oz (67.5 kg)  02/16/22 154 lb (69.9 kg)   Gen: NAD, resting comfortably HEENT: TMs normal bilaterally. OP clear. No thyromegaly noted.  CV: RRR with no murmurs appreciated Pulm: NWOB, CTAB with no crackles, wheezes, or rhonchi GI: Normal bowel sounds present. Soft, Nontender, Nondistended. MSK: no edema, cyanosis, or clubbing noted Skin: warm, dry Neuro: CN2-12 grossly intact. Strength 5/5 in upper and lower extremities. Reflexes symmetric and intact bilaterally.  Psych: Normal affect and thought content     Alhassan Everingham M. [name], MD 03/23/2022 2:17 PM

## 2022-03-23 NOTE — Assessment & Plan Note (Signed)
Symptoms are improving.  She follows with rheumatology

## 2022-03-23 NOTE — Assessment & Plan Note (Signed)
Follows with dermatology.  Overall symptoms are stable.  She does occasionally get conjunctivitis flareups and would like to have Polytrim to use on hand.  Will refill today.

## 2022-03-23 NOTE — Patient Instructions (Signed)
It was very nice to see you today!  We will check your A1c and cholesterol panel.  I will refill your Polytrim drops and acyclovir ointment.  We will see back in a year or so for your next physical.  Come back sooner if needed.  Take care, Dr [name]  PLEASE NOTE:  If you had any lab tests, please let us know if you have not heard back within a few days. You may see your results on mychart before we have a chance to review them but we will give you a call once they are reviewed by [place].   If we ordered any referrals today, please let us know if you have not heard from their office within the next week.   If you had any urgent prescriptions sent in today, please check with the pharmacy within an hour of our visit to make sure the prescription was transmitted appropriately.   Please try these tips to maintain a healthy lifestyle:  Eat at least 3 REAL meals and 1-2 snacks per day.  Aim for no more than 5 hours between eating.  If you eat breakfast, please do so within one hour of getting up.   Each meal should contain half fruits/vegetables, one quarter protein, and one quarter carbs (no bigger than a computer mouse)  Cut down on sweet beverages. This includes juice, soda, and sweet tea.   Drink at least 1 glass of water with each meal and aim for at least 8 glasses per day  Exercise at least 150 minutes every week.    Preventive Care 55 Years and Older, Female Preventive care refers to lifestyle choices and visits with your health care provider that can promote health and wellness. Preventive care visits are also called wellness exams. What can I expect for my preventive care visit? Counseling Your health care provider may ask you questions about your: Medical history, including: Past medical problems. Family medical history. Pregnancy and menstrual history. History of falls. Current health, including: Memory and ability to understand (cognition). Emotional well-being. Home  life and relationship well-being. Sexual activity and sexual health. Lifestyle, including: Alcohol, nicotine or tobacco, and drug use. Access to firearms. Diet, exercise, and sleep habits. Work and work Statistician. Sunscreen use. Safety issues such as seatbelt and bike helmet use. Physical exam Your health care provider will check your: Height and weight. These may be used to calculate your BMI (body mass index). BMI is a measurement that tells if you are at a healthy weight. Waist circumference. This measures the distance around your waistline. This measurement also tells if you are at a healthy weight and may help predict your risk of certain diseases, such as type 2 diabetes and high blood pressure. Heart rate and blood pressure. Body temperature. Skin for abnormal spots. What immunizations do I need?  Vaccines are usually given at various ages, according to a schedule. Your health care provider will recommend vaccines for you based on your age, medical history, and lifestyle or other factors, such as travel or where you work. What tests do I need? Screening Your health care provider may recommend screening tests for certain conditions. This may include: Lipid and cholesterol levels. Hepatitis C test. Hepatitis B test. HIV (human immunodeficiency virus) test. STI (sexually transmitted infection) testing, if you are at risk. Lung cancer screening. Colorectal cancer screening. Diabetes screening. This is done by checking your blood sugar (glucose) after you have not eaten for a while (fasting). Mammogram. Talk with your health  care provider about how often you should have regular mammograms. BRCA-related cancer screening. This may be done if you have a family history of breast, ovarian, tubal, or peritoneal cancers. Bone density scan. This is done to screen for osteoporosis. Talk with your health care provider about your test results, treatment options, and if necessary, the need  for more tests. Follow these instructions at home: Eating and drinking  Eat a diet that includes fresh fruits and vegetables, whole grains, lean protein, and low-fat dairy products. Limit your intake of foods with high amounts of sugar, saturated fats, and salt. Take vitamin and mineral supplements as recommended by your health care provider. Do not drink alcohol if your health care provider tells you not to drink. If you drink alcohol: Limit how much you have to 0-1 drink a day. Know how much alcohol is in your drink. In the [place] one drink equals one 12 oz bottle of beer (355 mL), one 5 oz glass of wine (148 mL), or one 1 oz glass of hard liquor (44 mL). Lifestyle Brush your teeth every morning and night with fluoride toothpaste. Floss one time each day. Exercise for at least 30 minutes 5 or more days each week. Do not use any products that contain nicotine or tobacco. These products include cigarettes, chewing tobacco, and vaping devices, such as e-cigarettes. If you need help quitting, ask your health care provider. Do not use drugs. If you are sexually active, practice safe sex. Use a condom or other form of protection in order to prevent STIs. Take aspirin only as told by your health care provider. Make sure that you understand how much to take and what form to take. Work with your health care provider to find out whether it is safe and beneficial for you to take aspirin daily. Ask your health care provider if you need to take a cholesterol-lowering medicine (statin). Find healthy ways to manage stress, such as: Meditation, yoga, or listening to music. Journaling. Talking to a trusted person. Spending time with friends and family. Minimize exposure to UV radiation to reduce your risk of skin cancer. Safety Always wear your seat belt while driving or riding in a vehicle. Do not drive: If you have been drinking alcohol. Do not ride with someone who has been drinking. When you are  tired or distracted. While texting. If you have been using any mind-altering substances or drugs. Wear a helmet and other protective equipment during sports activities. If you have firearms in your house, make sure you follow all gun safety procedures. What's next? Visit your health care provider once a year for an annual wellness visit. Ask your health care provider how often you should have your eyes and teeth checked. Stay up to date on all vaccines. This information is not intended to replace advice given to you by your health care provider. Make sure you discuss any questions you have with your health care provider. Document Revised: 06/23/2020 Document Reviewed: 06/23/2020 Elsevier Patient Education  [place].

## 2022-03-23 NOTE — Assessment & Plan Note (Signed)
No recent flare ups though would like to have acyclovir appointment on hand to use as needed.  Will refill today.

## 2022-03-24 ENCOUNTER — Telehealth: Payer: Self-pay | Admitting: *Deleted

## 2022-03-24 NOTE — Progress Notes (Signed)
Lipid panel WNL.  Hgb A1c 5.5%.

## 2022-03-24 NOTE — Progress Notes (Signed)
I had a detailed conversation with Edd Fabian.  Patient states she has been under a lot of stress and has been helping her daughter.  She denies having a flare of her autoimmune disease.  Her sedimentation was rate stays elevated over the years.  Liver function is also mildly elevated.  I advised a GI referral in the future if her LFTs are still elevated.  She will avoid taking Tylenol and NSAIDs.  White cell count is low at 2.9.  I advised her to repeat CBC in 1 month.  Urine protein creatinine ratio was normal.  Complements were normal.  Rest of the labs are pending.  We will discuss them once all results are available.

## 2022-03-24 NOTE — Telephone Encounter (Signed)
Patient contacted the office stating that she was returning a call from Dr. Estanislado Pandy.

## 2022-03-27 NOTE — Progress Notes (Signed)
ANA remains positive.

## 2022-03-28 LAB — CBC WITH DIFFERENTIAL/PLATELET
Absolute Monocytes: 232 cells/uL (ref 200–950)
Basophils Absolute: 9 cells/uL (ref 0–200)
Basophils Relative: 0.3 %
Eosinophils Absolute: 0 cells/uL — ABNORMAL LOW (ref 15–500)
Eosinophils Relative: 0 %
HCT: 36.9 % (ref 35.0–45.0)
Hemoglobin: 12.4 g/dL (ref 11.7–15.5)
Lymphs Abs: 676 cells/uL — ABNORMAL LOW (ref 850–3900)
MCH: 28.8 pg (ref 27.0–33.0)
MCHC: 33.6 g/dL (ref 32.0–36.0)
MCV: 85.8 fL (ref 80.0–100.0)
MPV: 9.6 fL (ref 7.5–12.5)
Monocytes Relative: 8 %
Neutro Abs: 1984 cells/uL (ref 1500–7800)
Neutrophils Relative %: 68.4 %
Platelets: 246 10*3/uL (ref 140–400)
RBC: 4.3 10*6/uL (ref 3.80–5.10)
RDW: 12.7 % (ref 11.0–15.0)
Total Lymphocyte: 23.3 %
WBC: 2.9 10*3/uL — ABNORMAL LOW (ref 3.8–10.8)

## 2022-03-28 LAB — COMPLETE METABOLIC PANEL WITH GFR
AG Ratio: 1.2 (calc) (ref 1.0–2.5)
ALT: 9 U/L (ref 6–29)
AST: 45 U/L — ABNORMAL HIGH (ref 10–35)
Albumin: 4.3 g/dL (ref 3.6–5.1)
Alkaline phosphatase (APISO): 63 U/L (ref 37–153)
BUN: 13 mg/dL (ref 7–25)
CO2: 23 mmol/L (ref 20–32)
Calcium: 9.2 mg/dL (ref 8.6–10.4)
Chloride: 102 mmol/L (ref 98–110)
Creat: 0.9 mg/dL (ref 0.50–1.05)
Globulin: 3.5 g/dL (calc) (ref 1.9–3.7)
Glucose, Bld: 71 mg/dL (ref 65–99)
Potassium: 4 mmol/L (ref 3.5–5.3)
Sodium: 135 mmol/L (ref 135–146)
Total Bilirubin: 0.8 mg/dL (ref 0.2–1.2)
Total Protein: 7.8 g/dL (ref 6.1–8.1)
eGFR: 71 mL/min/{1.73_m2} (ref >=60)

## 2022-03-28 LAB — LIPID PANEL
Cholesterol: 164 mg/dL (ref <200)
HDL: 56 mg/dL (ref >=50)
LDL Cholesterol (Calc): 95 mg/dL (calc)
Non-HDL Cholesterol (Calc): 108 mg/dL (calc) (ref <130)
Total CHOL/HDL Ratio: 2.9 (calc) (ref <5.0)
Triglycerides: 52 mg/dL (ref <150)

## 2022-03-28 LAB — PAN-ANCA
ANCA SCREEN: NEGATIVE
Myeloperoxidase Abs: <1 AI (ref <1.0)
Serine Protease 3: <1 AI (ref <1.0)

## 2022-03-28 LAB — C3 AND C4
C3 Complement: 125 mg/dL (ref 83–193)
C4 Complement: 22 mg/dL (ref 15–57)

## 2022-03-28 LAB — SJOGRENS SYNDROME-A EXTRACTABLE NUCLEAR ANTIBODY: SSA (Ro) (ENA) Antibody, IgG: >8 AI — AB

## 2022-03-28 LAB — SEDIMENTATION RATE: Sed Rate: 43 mm/h — ABNORMAL HIGH (ref 0–30)

## 2022-03-28 LAB — ANTI-NUCLEAR AB-TITER (ANA TITER): ANA Titer 1: 1:1280 {titer} — ABNORMAL HIGH

## 2022-03-28 LAB — PROTEIN / CREATININE RATIO, URINE
Creatinine, Urine: 82 mg/dL (ref 20–275)
Protein/Creat Ratio: 49 mg/g creat (ref 24–184)
Protein/Creatinine Ratio: 0.049 mg/mg creat (ref 0.024–0.184)
Total Protein, Urine: 4 mg/dL — ABNORMAL LOW (ref 5–24)

## 2022-03-28 LAB — ANA: Anti Nuclear Antibody (ANA): POSITIVE — AB

## 2022-03-28 LAB — HEMOGLOBIN A1C
Hgb A1c MFr Bld: 5.5 % of total Hgb (ref <5.7)
Mean Plasma Glucose: 111 mg/dL
eAG (mmol/L): 6.2 mmol/L

## 2022-03-28 LAB — ANTI-DNA ANTIBODY, DOUBLE-STRANDED: ds DNA Ab: 1 IU/mL

## 2022-03-29 NOTE — Progress Notes (Signed)
Pan-ANCA screening negative.

## 2022-04-11 ENCOUNTER — Other Ambulatory Visit: Payer: Self-pay | Admitting: Family Medicine

## 2022-04-12 ENCOUNTER — Other Ambulatory Visit (HOSPITAL_COMMUNITY): Payer: Self-pay

## 2022-04-12 ENCOUNTER — Other Ambulatory Visit: Payer: Self-pay

## 2022-04-12 MED ORDER — TYRVAYA 0.03 MG/ACT NA SOLN
1.0000 | Freq: Two times a day (BID) | NASAL | 2 refills | Status: DC
  Filled 2022-04-12: qty 8.4, 30d supply, fill #0

## 2022-04-20 ENCOUNTER — Encounter: Payer: Self-pay | Admitting: Podiatry

## 2022-04-20 ENCOUNTER — Other Ambulatory Visit: Payer: Self-pay | Admitting: *Deleted

## 2022-04-20 ENCOUNTER — Ambulatory Visit (INDEPENDENT_AMBULATORY_CARE_PROVIDER_SITE_OTHER): Payer: PPO | Admitting: Podiatry

## 2022-04-20 DIAGNOSIS — I7300 Raynaud's syndrome without gangrene: Secondary | ICD-10-CM

## 2022-04-20 DIAGNOSIS — Z79899 Other long term (current) drug therapy: Secondary | ICD-10-CM

## 2022-04-20 LAB — CBC WITH DIFFERENTIAL/PLATELET
Absolute Monocytes: 428 cells/uL (ref 200–950)
Basophils Absolute: 10 cells/uL (ref 0–200)
Basophils Relative: 0.2 %
Eosinophils Absolute: 10 cells/uL — ABNORMAL LOW (ref 15–500)
Eosinophils Relative: 0.2 %
HCT: 39.5 % (ref 35.0–45.0)
Hemoglobin: 13.1 g/dL (ref 11.7–15.5)
Lymphs Abs: 867 cells/uL (ref 850–3900)
MCH: 28.5 pg (ref 27.0–33.0)
MCHC: 33.2 g/dL (ref 32.0–36.0)
MCV: 85.9 fL (ref 80.0–100.0)
MPV: 9.4 fL (ref 7.5–12.5)
Monocytes Relative: 8.4 %
Neutro Abs: 3784 cells/uL (ref 1500–7800)
Neutrophils Relative %: 74.2 %
Platelets: 322 10*3/uL (ref 140–400)
RBC: 4.6 10*6/uL (ref 3.80–5.10)
RDW: 12.6 % (ref 11.0–15.0)
Total Lymphocyte: 17 %
WBC: 5.1 10*3/uL (ref 3.8–10.8)

## 2022-04-20 NOTE — Progress Notes (Signed)
She presents today for follow-up of her Raynaud's disease and vasculitis.  She states that she seems to be doing better at this point.  She states that Dr.Devonshire has taken her off of all of her medications including medications prescribed by other physicians.  She states that she is no longer allowed to take even simple supplements due to increased hepatic enzymes.  Objective: Pulses remain palpable today no open lesions or wounds her vasculitis/Raynaud's appears to be doing much better at this point.  Possibly associated to the medicine change.  Assessment: Vasculitis bilateral lower extremity.  Plan: Follow-up as needed.

## 2022-04-21 NOTE — Progress Notes (Signed)
CBC is normal with improvement in lymphocyte count.

## 2022-05-20 ENCOUNTER — Other Ambulatory Visit: Payer: Self-pay | Admitting: Rheumatology

## 2022-05-22 ENCOUNTER — Other Ambulatory Visit: Payer: Self-pay

## 2022-05-22 MED ORDER — AZATHIOPRINE 50 MG PO TABS
50.0000 mg | ORAL_TABLET | Freq: Every day | ORAL | 0 refills | Status: DC
  Filled 2022-05-22: qty 90, 90d supply, fill #0

## 2022-05-22 NOTE — Telephone Encounter (Signed)
Last Fill: 02/09/2022  Labs: 03/23/2022 I had a detailed conversation with Jennifer Barr.  Patient states she has been under a lot of stress and has been helping her daughter.  She denies having a flare of her autoimmune disease.  Her sedimentation was rate stays elevated over the years.  Liver function is also mildly elevated.  I advised a GI referral in the future if her LFTs are still elevated.  She will avoid taking Tylenol and NSAIDs.  White cell count is low at 2.9.  I advised her to repeat CBC in 1 month.  Urine protein creatinine ratio was normal.  Complements were normal.  Rest of the labs are pending.  We will discuss them once all results are available.  04/20/2022 CBC: CBC is normal with improvement in lymphocyte count.   Next Visit: 07/20/2022  Last Visit: 02/16/2022  DX: Sjogren's syndrome with other organ involvement   Current Dose per office note on 02/16/2022: Imuran 50 mg by mouth daily.   Okay to refill Imuran?

## 2022-06-08 ENCOUNTER — Other Ambulatory Visit (HOSPITAL_COMMUNITY): Payer: Self-pay

## 2022-06-08 DIAGNOSIS — M7021 Olecranon bursitis, right elbow: Secondary | ICD-10-CM | POA: Diagnosis not present

## 2022-06-08 MED ORDER — AMOXICILLIN-POT CLAVULANATE 500-125 MG PO TABS
1.0000 | ORAL_TABLET | Freq: Two times a day (BID) | ORAL | 0 refills | Status: DC
  Filled 2022-06-08: qty 14, 7d supply, fill #0

## 2022-06-22 ENCOUNTER — Other Ambulatory Visit: Payer: Self-pay

## 2022-06-22 DIAGNOSIS — Z79899 Other long term (current) drug therapy: Secondary | ICD-10-CM

## 2022-06-23 LAB — CBC WITH DIFFERENTIAL/PLATELET
Absolute Monocytes: 389 cells/uL (ref 200–950)
Basophils Absolute: 19 cells/uL (ref 0–200)
Basophils Relative: 0.5 %
Eosinophils Absolute: 11 cells/uL — ABNORMAL LOW (ref 15–500)
Eosinophils Relative: 0.3 %
HCT: 36.1 % (ref 35.0–45.0)
Hemoglobin: 11.9 g/dL (ref 11.7–15.5)
Lymphs Abs: 710 cells/uL — ABNORMAL LOW (ref 850–3900)
MCH: 28.7 pg (ref 27.0–33.0)
MCHC: 33 g/dL (ref 32.0–36.0)
MCV: 87.2 fL (ref 80.0–100.0)
MPV: 9.4 fL (ref 7.5–12.5)
Monocytes Relative: 10.5 %
Neutro Abs: 2572 cells/uL (ref 1500–7800)
Neutrophils Relative %: 69.5 %
Platelets: 235 10*3/uL (ref 140–400)
RBC: 4.14 10*6/uL (ref 3.80–5.10)
RDW: 12.8 % (ref 11.0–15.0)
Total Lymphocyte: 19.2 %
WBC: 3.7 10*3/uL — ABNORMAL LOW (ref 3.8–10.8)

## 2022-06-23 LAB — COMPLETE METABOLIC PANEL WITH GFR
AG Ratio: 1.1 (calc) (ref 1.0–2.5)
ALT: 8 U/L (ref 6–29)
AST: 35 U/L (ref 10–35)
Albumin: 3.9 g/dL (ref 3.6–5.1)
Alkaline phosphatase (APISO): 79 U/L (ref 37–153)
BUN: 19 mg/dL (ref 7–25)
CO2: 24 mmol/L (ref 20–32)
Calcium: 8.8 mg/dL (ref 8.6–10.4)
Chloride: 103 mmol/L (ref 98–110)
Creat: 0.85 mg/dL (ref 0.50–1.05)
Globulin: 3.5 g/dL (calc) (ref 1.9–3.7)
Glucose, Bld: 91 mg/dL (ref 65–99)
Potassium: 4.1 mmol/L (ref 3.5–5.3)
Sodium: 135 mmol/L (ref 135–146)
Total Bilirubin: 0.5 mg/dL (ref 0.2–1.2)
Total Protein: 7.4 g/dL (ref 6.1–8.1)
eGFR: 76 mL/min/{1.73_m2} (ref >=60)

## 2022-06-23 NOTE — Progress Notes (Signed)
White cell count is 3.7 patient is stable.  Lymphocyte count is low at 710 due to immunosuppression.  CMP is normal.

## 2022-07-06 ENCOUNTER — Other Ambulatory Visit: Payer: Self-pay

## 2022-07-06 NOTE — Progress Notes (Unsigned)
Office Visit Note  Patient: Jennifer Barr             Date of Birth: [birth year]           MRN: [id number]             PCP: Ardith Dark, MD Referring: Ardith Dark, MD Visit Date: 07/20/2022 Occupation: @GUAROCC @  Subjective:  Medication monitoring   History of Present Illness: Jennifer Barr is a 67 y.o. female with history of sjogren's syndrome, osteoarthritis, and vasculitis.  Patient remains on Imuran 50 mg by mouth daily.  Patient continues to tolerate Imuran without any side effects.  She denies any new concerns since her last office visit.  She continues to have chronic sicca symptoms which have been unchanged.  She uses over-the-counter products for symptomatic relief.  She denies any swollen lymph nodes.  Patient states that she has had some intermittent rashes and redness on her skin which she attributes to sun sensitivity.  She denies any increase joint pain or joint swelling.  She denies any recent or recurrent infections.  Activities of Daily Living:  Patient reports morning stiffness for 0 minutes.   Patient Denies nocturnal pain.  Difficulty dressing/grooming: Denies Difficulty climbing stairs: Denies Difficulty getting out of chair: Denies Difficulty using hands for taps, buttons, cutlery, and/or writing: Denies  Review of Systems  Constitutional:  Positive for fatigue.  HENT:  Positive for mouth dryness. Negative for mouth sores.   Eyes:  Positive for dryness.  Respiratory:  Negative for shortness of breath.   Cardiovascular:  Negative for chest pain and palpitations.  Gastrointestinal:  Negative for blood in stool, constipation and diarrhea.  Endocrine: Negative for increased urination.  Genitourinary:  Negative for involuntary urination.  Musculoskeletal:  Negative for joint pain, gait problem, joint pain, joint swelling, myalgias, muscle weakness, morning stiffness, muscle tenderness and myalgias.  Skin:  Positive for color change and hair loss. Negative for  rash and sensitivity to sunlight.  Allergic/Immunologic: Negative for susceptible to infections.  Neurological:  Negative for dizziness and headaches.  Hematological:  Negative for swollen glands.  Psychiatric/Behavioral:  Negative for depressed mood and sleep disturbance. The patient is not nervous/anxious.     PMFS History:  Patient Active Problem List   Diagnosis Date Noted   Cold sore 03/23/2022   Peripheral neuropathy 12/14/2020   Acute parotitis 12/03/2017   ANA positive 02/10/2016   Rheumatoid factor positive 02/10/2016   Primary osteoarthritis, right hand 02/10/2016   Scoliosis 02/10/2016   Raynaud's syndrome without gangrene 02/10/2016   Sjogren's disease (HCC) 05/07/2014   Vasculitis (HCC) 05/07/2014   Hashimoto's thyroiditis 05/07/2014    Past Medical History:  Diagnosis Date   HSV infection    Osteopenia 07/2017   T score -1.3 FRAX 7.2% / 0.6%   Premature ovarian failure    Sjoegren syndrome    Dr. Corliss Skains   Thyroid disease    Hashiomto   Vasculitis (HCC)    Dr. Corliss Skains    Family History  Problem Relation Age of Onset   Diabetes Mother    Diabetes Father    Scleroderma Daughter    Past Surgical History:  Procedure Laterality Date   TUBAL LIGATION     Social History   Social History Narrative   Not on file   Immunization History  Administered Date(s) Administered   Fluad Quad(high Dose 65+) 10/19/2021   Influenza Inj Mdck Quad Pf 10/25/2020   Influenza, High Dose Seasonal PF 10/14/2018  Influenza,inj,Quad PF,6+ Mos 10/14/2018   Influenza-Unspecified 10/25/2020   Moderna Sars-Covid-2 Vaccination 03/25/2019, 04/22/2019, 09/01/2019   PNEUMOCOCCAL CONJUGATE-20 10/19/2021   Tdap 06/06/2018   Zoster Recombinant(Shingrix) 10/25/2020, 03/01/2021     Objective: Vital Signs: BP 122/74 (BP Location: Left Arm, Patient Position: Sitting, Cuff Size: Normal)   Pulse 78   Resp 13   Ht 5\' 8"  (1.727 m)   Wt 148 lb 3.2 oz (67.2 kg)   BMI 22.53 kg/m     Physical Exam Vitals and nursing note reviewed.  Constitutional:      Appearance: She is well-developed.  HENT:     Head: Normocephalic and atraumatic.  Eyes:     Conjunctiva/sclera: Conjunctivae normal.  Cardiovascular:     Rate and Rhythm: Normal rate and regular rhythm.     Heart sounds: Normal heart sounds.  Pulmonary:     Effort: Pulmonary effort is normal.     Breath sounds: Normal breath sounds.  Abdominal:     General: Bowel sounds are normal.     Palpations: Abdomen is soft.  Musculoskeletal:     Cervical back: Normal range of motion.  Lymphadenopathy:     Cervical: No cervical adenopathy.  Skin:    General: Skin is warm and dry.     Capillary Refill: Capillary refill takes 2 to 3 seconds.  Neurological:     Mental Status: She is alert and oriented to person, place, and time.  Psychiatric:        Behavior: Behavior normal.      Musculoskeletal Exam: C-spine has good range of motion with no discomfort.  Shoulder joints, elbow joints, wrist joints, MCPs, PIPs, DIPs have good range of motion with no synovitis.  Complete fist formation bilaterally.  Hip joints have good range of motion with no groin pain.  Knee joints have good range of motion and warmth effusion.  Ankle joints have good range of motion with no tenderness or joint swelling.  CDAI Exam: CDAI Score: -- Patient Global: --; Provider Global: -- Swollen: --; Tender: -- Joint Exam 07/20/2022   No joint exam has been documented for this visit   There is currently no information documented on the homunculus. Go to the Rheumatology activity and complete the homunculus joint exam.  Investigation: No additional findings.  Imaging: No results found.  Recent Labs: Lab Results  Component Value Date   WBC 3.7 (L) 06/22/2022   HGB 11.9 06/22/2022   PLT 235 06/22/2022   NA 135 06/22/2022   K 4.1 06/22/2022   CL 103 06/22/2022   CO2 24 06/22/2022   GLUCOSE 91 06/22/2022   BUN 19 06/22/2022    CREATININE 0.85 06/22/2022   BILITOT 0.5 06/22/2022   ALKPHOS 81 09/01/2019   AST 35 06/22/2022   ALT 8 06/22/2022   PROT 7.4 06/22/2022   ALBUMIN 4.3 09/01/2019   CALCIUM 8.8 06/22/2022   GFRAA 79 06/17/2020    Speciality Comments: PLQ Eye Exam: 04/22/2019 WNL @ [facility] Ledger, OD PA  Procedures:  No procedures performed Allergies: Sulfa antibiotics     Assessment / Plan:     Visit Diagnoses: Sjogren's syndrome with other organ involvement (HCC) - positive ANA, positive RF, positive Ro, positive La.  Rheumatoid factor was negative and SPEP was normal in August 2020.  Patient continues to have chronic sicca symptoms.  Overall her sicca symptoms have been unchanged and she continues to use over-the-counter products for symptomatic relief.  She remains on Imuran 50 mg 1 tablet by mouth daily.  Discussed the 4-14 fold increased risk for developing lymphoma in patients with Sjogren's syndrome.  I also discussed the increased risk for developing interstitial lung disease in patients with Sjogren's syndrome.  She has not noticed any new or worsening pulmonary symptoms.  Lungs were clear to auscultation today. No signs of inflammatory arthritis were noted.  She had no synovitis on examination today.  Lab work from 03/23/2022 was reviewed today in the office: ANA 1:1280 nuclear, fine speckled, double-stranded A1, complements within normal limits, ESR 43, Ro antibody >8, pan-ANCA negative.  Future orders for the following lab work were placed today to be drawn in September.   She will remain on Imuran 50 mg 1 tablet by mouth daily.  She was advised to notify us if she develops any new or worsening symptoms. She will follow-up in the office in 5 months or sooner if needed.  - Plan: Serum protein electrophoresis with reflex, Rheumatoid factor, Sjogrens syndrome-A extractable nuclear antibody, ANA, C3 and C4, Sedimentation rate, Urinalysis, Routine w reflex microscopic, COMPLETE METABOLIC PANEL WITH GFR, CBC  with Differential/Platelet  Raynaud's syndrome without gangrene: Patient continues to experience intermittent symptoms of Raynaud's phenomenon.  Capillary refill 2 to 3 seconds in her toes.  No digital ulcerations noted.  High risk medication use - Imuran 50 mg 1 tablet by mouth daily. (PLQ was discontinued and June due to lymphopenia). CBC and CMP were updated on 06/22/2022. Her next lab work will be due in September and every 3 months.  Future orders for CBC and CMP replaced today. Recent or recurrent infections.- Plan: COMPLETE METABOLIC PANEL WITH GFR, CBC with Differential/Platelet  Lymphopenia - Dr. Gwyneth Sprout blood cell count was 3.7, absolute lymphocytes 710, absolute eosinophils 11 on 06/22/2022-Future order for CBC with differential placed today.- Plan: CBC with Differential/Platelet  Primary osteoarthritis of both hands: No tenderness or synovitis noted.   Vasculitis (HCC) -  No recurrence since July 2020. Pan-ANCA negative on 03/23/22.  No recurrence.  She has intermittent symptoms of raynaud's phenomenon--delayed capillary refill 2-3 seconds noted both feet.  Photosensitivity rash noted on anterior surface of thighs. Future orders for the following lab work placed today.  - Plan: Sedimentation rate, Urinalysis, Routine w reflex microscopic, COMPLETE METABOLIC PANEL WITH GFR, CBC with Differential/Platelet  Other medical conditions are listed as follows:   Scoliosis  History of hypothyroidism  Osteopenia of multiple sites - July 11, 2017 DEXA scan showed osteopenia with T score -1.3 and right femoral neck. Due for updated DEXA.  COVID-19 virus infection  Orders: Orders Placed This Encounter  Procedures   Serum protein electrophoresis with reflex   Rheumatoid factor   Sjogrens syndrome-A extractable nuclear antibody   ANA   C3 and C4   Sedimentation rate   Urinalysis, Routine w reflex microscopic   COMPLETE METABOLIC PANEL WITH GFR   CBC with Differential/Platelet   No  orders of the defined types were placed in this encounter.  Follow-Up Instructions: Return in about 5 months (around 12/20/2022) for Sjogren's syndrome, Osteoarthritis.   Gearldine Bienenstock, PA-C  Note - This record has been created using Dragon software.  Chart creation errors have been sought, but may not always  have been located. Such creation errors do not reflect on  the standard of medical care.

## 2022-07-20 ENCOUNTER — Ambulatory Visit: Payer: PPO | Attending: Physician Assistant | Admitting: Physician Assistant

## 2022-07-20 ENCOUNTER — Encounter: Payer: Self-pay | Admitting: Physician Assistant

## 2022-07-20 VITALS — BP 122/74 | HR 78 | Resp 13 | Ht 68.0 in | Wt 148.2 lb

## 2022-07-20 DIAGNOSIS — U071 COVID-19: Secondary | ICD-10-CM | POA: Diagnosis not present

## 2022-07-20 DIAGNOSIS — M8589 Other specified disorders of bone density and structure, multiple sites: Secondary | ICD-10-CM | POA: Diagnosis not present

## 2022-07-20 DIAGNOSIS — D72810 Lymphocytopenia: Secondary | ICD-10-CM | POA: Diagnosis not present

## 2022-07-20 DIAGNOSIS — Z79899 Other long term (current) drug therapy: Secondary | ICD-10-CM

## 2022-07-20 DIAGNOSIS — M419 Scoliosis, unspecified: Secondary | ICD-10-CM

## 2022-07-20 DIAGNOSIS — I7300 Raynaud's syndrome without gangrene: Secondary | ICD-10-CM

## 2022-07-20 DIAGNOSIS — Z8639 Personal history of other endocrine, nutritional and metabolic disease: Secondary | ICD-10-CM

## 2022-07-20 DIAGNOSIS — M19042 Primary osteoarthritis, left hand: Secondary | ICD-10-CM

## 2022-07-20 DIAGNOSIS — M3509 Sicca syndrome with other organ involvement: Secondary | ICD-10-CM

## 2022-07-20 DIAGNOSIS — M19041 Primary osteoarthritis, right hand: Secondary | ICD-10-CM | POA: Diagnosis not present

## 2022-07-20 DIAGNOSIS — I776 Arteritis, unspecified: Secondary | ICD-10-CM

## 2022-07-20 NOTE — Patient Instructions (Signed)
Standing Labs We placed an order today for your standing lab work.   Please have your standing labs drawn in September and every 3 months   Please have your labs drawn 2 weeks prior to your appointment so that the provider can discuss your lab results at your appointment, if possible.  Please note that you may see your imaging and lab results in MyChart before we have reviewed them. We will contact you once all results are reviewed. Please allow our office up to 72 hours to thoroughly review all of the results before contacting the office for clarification of your results.  WALK-IN LAB HOURS  Monday through Thursday from 8:00 am -12:30 pm and 1:00 pm-5:00 pm and Friday from 8:00 am-12:00 pm.  Patients with office visits requiring labs will be seen before walk-in labs.  You may encounter longer than normal wait times. Please allow additional time. Wait times may be shorter on  Monday and Thursday afternoons.  We do not book appointments for walk-in labs. We appreciate your patience and understanding with our staff.   Labs are drawn by [facility]. Please bring your co-pay at the time of your lab draw.  You may receive a bill from [facility] for your lab work.  Please note if you are on Hydroxychloroquine and and an order has been placed for a Hydroxychloroquine level,  you will need to have it drawn 4 hours or more after your last dose.  If you wish to have your labs drawn at another location, please call the office 24 hours in advance so we can fax the orders.  The office is located at [address], [address], [place], [place] [address]   If you have any questions regarding directions or hours of operation,  please call [phone].   As a reminder, please drink plenty of water prior to coming for your lab work. Thanks!  

## 2022-08-04 ENCOUNTER — Other Ambulatory Visit: Payer: Self-pay | Admitting: Obstetrics & Gynecology

## 2022-08-04 DIAGNOSIS — Z1231 Encounter for screening mammogram for malignant neoplasm of breast: Secondary | ICD-10-CM

## 2022-08-20 ENCOUNTER — Other Ambulatory Visit (HOSPITAL_COMMUNITY): Payer: Self-pay

## 2022-08-20 ENCOUNTER — Other Ambulatory Visit: Payer: Self-pay | Admitting: Rheumatology

## 2022-08-21 ENCOUNTER — Other Ambulatory Visit (HOSPITAL_COMMUNITY): Payer: Self-pay

## 2022-08-21 MED ORDER — AZATHIOPRINE 50 MG PO TABS
50.0000 mg | ORAL_TABLET | Freq: Every day | ORAL | 0 refills | Status: DC
  Filled 2022-08-21: qty 90, 90d supply, fill #0

## 2022-08-21 NOTE — Telephone Encounter (Signed)
Last Fill: 05/22/2022  Labs: 06/22/2022 White cell count is 3.7 patient is stable.  Lymphocyte count is low at 710 due to immunosuppression.  CMP is normal.   Next Visit: 01/25/2023  Last Visit: 07/20/2022  DX: Sjogren's syndrome with other organ involvement   Current Dose per office note 07/20/2022: Imuran 50 mg 1 tablet by mouth daily.   Okay to refill Imuran?

## 2022-08-22 ENCOUNTER — Other Ambulatory Visit: Payer: Self-pay

## 2022-08-22 ENCOUNTER — Other Ambulatory Visit (HOSPITAL_COMMUNITY): Payer: Self-pay

## 2022-08-30 ENCOUNTER — Other Ambulatory Visit: Payer: Self-pay | Admitting: Obstetrics and Gynecology

## 2022-08-30 DIAGNOSIS — Z1231 Encounter for screening mammogram for malignant neoplasm of breast: Secondary | ICD-10-CM

## 2022-09-07 ENCOUNTER — Ambulatory Visit
Admission: RE | Admit: 2022-09-07 | Discharge: 2022-09-07 | Disposition: A | Discharge status: Home / Self Care | Payer: PPO | Source: Ambulatory Visit | Attending: Obstetrics & Gynecology | Admitting: Obstetrics & Gynecology

## 2022-09-07 DIAGNOSIS — Z1231 Encounter for screening mammogram for malignant neoplasm of breast: Secondary | ICD-10-CM

## 2022-09-08 ENCOUNTER — Other Ambulatory Visit (HOSPITAL_COMMUNITY): Payer: Self-pay

## 2022-09-08 ENCOUNTER — Telehealth: Payer: PPO | Admitting: Internal Medicine

## 2022-09-08 ENCOUNTER — Encounter: Payer: Self-pay | Admitting: Internal Medicine

## 2022-09-08 DIAGNOSIS — U071 COVID-19: Secondary | ICD-10-CM | POA: Diagnosis not present

## 2022-09-08 MED ORDER — NIRMATRELVIR/RITONAVIR (PAXLOVID)TABLET
3.0000 | ORAL_TABLET | Freq: Two times a day (BID) | ORAL | 0 refills | Status: AC
Start: 2022-09-08 — End: 2022-09-13
  Filled 2022-09-08: qty 30, 5d supply, fill #0

## 2022-09-08 NOTE — Progress Notes (Signed)
[facility]: [phone]   -- Medical Virtual Video Office Visit --  Patient:  Jennifer Barr (February 02, 1955) located at home MRN:   [id number]      Date:   09/08/2022  PCP:    Ardith Dark, MD   Today's Healthcare Provider: Lula Olszewski, MD located at office: [facility] [address] [place] [address] Today's Telemedicine visit was conducted via Video for 72m 00s after consent for telemedicine was obtained:  Video connection was never lost All video encounter participant identities and locations confirmed visually and verbally.    Assessment and Plan    COVID-19 On day 3 of symptoms, she exhibit mild disease characterized by chest tightness and cough. 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 We will prescribe Paxlovid and advise her to avoid taking it with vitamin C and garlic within the same hour to prevent absorption issues. We advised her to monitor symptoms and contact the on-call service over the weekend if necessary. A check-in post-COVID-19 recovery will be scheduled to assess disease control and medication management.   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 She expressed concerns about continuing this medication during her COVID-19 infection. After reviewing her disease control and recent flares, we decided to continue azathioprine concurrently with Paxlovid unless the rheumatologist advises otherwise. She is advised to contact her rheumatologist for further guidance if needed.  Diagnoses and all orders for this visit: COVID -     nirmatrelvir/ritonavir (PAXLOVID) 20 x 150 MG & 10 x 100MG  TABS; Take 3 tablets by mouth 2 (two) times daily for 5 days. (Take nirmatrelvir 150 mg two tablets twice daily for 5 days and ritonavir 100  mg one tablet twice daily for 5 days) Patient GFR is over 60  Recommended follow-up: 1 week with Primary Care Provider (PCP)  Future Appointments  Date Time Provider Department Center  11/09/2022  3:30 PM Arlie Solomons B, NP [facility] None  01/25/2023  3:00 PM Deveshwar, Janalyn Rouse, MD [facility] None         Subjective   67 y.o. female who has Sjogren's disease (HCC); Vasculitis (HCC); Hashimoto's thyroiditis; ANA positive; Rheumatoid factor positive; Primary osteoarthritis, right hand; Scoliosis; Raynaud's syndrome without gangrene; Peripheral neuropathy; Acute parotitis; and Cold sore on their problem list. Her reasons/main concerns/chief complaints for today's office visit are Covid Positive (Tested positive yesterday. Symptoms: cold symptoms- stuffy and runny nose, dry cough, no taste or smell, slight pink eye, headache (took OTC pain medication) with some relief. Also has taken Nyquil. Had a fever but not currently.)   ------------------------------------------------------------------------------------------------------------------------ AI-Extracted: Discussed the use of AI scribe software for clinical note transcription with the patient, who gave verbal consent to proceed.  History of Present Illness   The patient, with a history of vasculitis and Sjogren's managed with immunosuppressive drugs, presented with symptoms suggestive of COVID-19. The initial symptom was nasal stuffiness, which began on a Tuesday, followed by fatigue. The symptoms worsened by Wednesday, and she experienced a fever overnight. She also reported a sensation of tightness in the chest and a mild dry cough. Despite these symptoms, she was able to move around without significant difficulty.  The patient's concern was heightened due to her immunosuppressed status and the potential impact of COVID-19 on her existing conditions. She also expressed concern about the potential impact of the  proposed Paxlovid treatment on her liver  function, given that she has had high liver function tests in the past, attributed to her azathioprine medication.  The patient's vasculitis is well-controlled with medication, and she has not experienced significant flare-ups since retiring and improving her sleep patterns. However, she continues to experience dry eyes due to Sjogren's, which she manages with a contact lens worn like a band-aid for comfort.  The patient has previously stopped her immunosuppressive medication during a severe cold without experiencing a flare-up of her connective tissue disease. She expressed uncertainty about whether a temporary cessation of the medication would have a detrimental effect in the current situation.  The patient was also concerned about the potential for transmitting the virus to others, as she had plans to travel with family in the near future. She was interested in understanding the duration of her contagious period and the potential impact of Paxlovid on reducing this.      She has a past medical history of HSV infection, Osteopenia (07/2017), Premature ovarian failure, Sjoegren syndrome, Thyroid disease, and Vasculitis (HCC).  Allergies  Allergen Reactions   Sulfa Antibiotics Rash   Patient Active Problem List   Diagnosis Date Noted Date Diagnosed   Cold sore 03/23/2022    Peripheral neuropathy 12/14/2020    Acute parotitis 12/03/2017    ANA positive 02/10/2016     with positive ANA, positive Ro and La antibodies, and positive rheumatoid factor.      Rheumatoid factor positive 02/10/2016    Primary osteoarthritis, right hand 02/10/2016     Right CMC OA    Scoliosis 02/10/2016    Raynaud's syndrome without gangrene 02/10/2016    Sjogren's disease (HCC) 05/07/2014    Vasculitis (HCC) 05/07/2014    Hashimoto's thyroiditis 05/07/2014    Past Medical History  - Mixed connective tissue disease - Vasculitis - Sjogren's syndrome Medications  Medication(s)  - Azathioprine -  Synthroid - Vitamins - Ibuprofen  LABS Liver function tests: almost normal (03/2022) BUN: normal Creatinine: normal   Objective   Physical Exam  There were no vitals taken for this visit. Wt Readings from Last 10 Encounters:  07/20/22 148 lb 3.2 oz (67.2 kg)  03/23/22 148 lb 6.4 oz (67.3 kg)  03/16/22 148 lb 12.8 oz (67.5 kg)  02/16/22 154 lb (69.9 kg)  09/08/21 147 lb (66.7 kg)  08/16/21 148 lb 3.2 oz (67.2 kg)  02/15/21 149 lb (67.6 kg)  12/14/20 149 lb 3.2 oz (67.7 kg)  08/23/20 149 lb (67.6 kg)  08/17/20 145 lb 12.8 oz (66.1 kg)  Frequent coughing/throat clearing. General Appearance:  No acute distress appreciable.   Well-groomed, healthy-appearing female.  Well proportioned with no abnormal fat distribution.  Good muscle tone. Pulmonary:  Normal work of breathing at rest, no respiratory distress apparent.    Musculoskeletal: All extremities are intact.  Neurological:  Awake, alert, oriented, and engaged.  No obvious focal neurological deficits or cognitive impairments.  Sensorium seems unclouded.   Speech is clear and coherent with logical content. Psychiatric:  Appropriate mood, pleasant and cooperative demeanor, thoughtful and engaged during the exam  Results   LABS Liver function tests: almost normal (03/2022) BUN: normal Creatinine: normal        No results found for any visits on 09/08/22.  Orders Only on 06/22/2022  Component Date Value   Glucose, Bld 06/22/2022 91    BUN 06/22/2022 19    Creat 06/22/2022 0.85    eGFR 06/22/2022 76    BUN/Creatinine Ratio  06/22/2022 SEE NOTE:    Sodium 06/22/2022 135    Potassium 06/22/2022 4.1    Chloride 06/22/2022 103    CO2 06/22/2022 24    Calcium 06/22/2022 8.8    Total Protein 06/22/2022 7.4    Albumin 06/22/2022 3.9    Globulin 06/22/2022 3.5    AG Ratio 06/22/2022 1.1    Total Bilirubin 06/22/2022 0.5    Alkaline phosphatase (AP* 06/22/2022 79    AST 06/22/2022 35    ALT 06/22/2022 8    WBC 06/22/2022  3.7 (L)    RBC 06/22/2022 4.14    Hemoglobin 06/22/2022 11.9    HCT 06/22/2022 36.1    MCV 06/22/2022 87.2    MCH 06/22/2022 28.7    MCHC 06/22/2022 33.0    RDW 06/22/2022 12.8    Platelets 06/22/2022 235    MPV 06/22/2022 9.4    Neutro Abs 06/22/2022 2,572    Lymphs Abs 06/22/2022 710 (L)    Absolute Monocytes 06/22/2022 389    Eosinophils Absolute 06/22/2022 11 (L)    Basophils Absolute 06/22/2022 19    Neutrophils Relative % 06/22/2022 69.5    Total Lymphocyte 06/22/2022 19.2    Monocytes Relative 06/22/2022 10.5    Eosinophils Relative 06/22/2022 0.3    Basophils Relative 06/22/2022 0.5   Orders Only on 04/20/2022  Component Date Value   WBC 04/20/2022 5.1    RBC 04/20/2022 4.60    Hemoglobin 04/20/2022 13.1    HCT 04/20/2022 39.5    MCV 04/20/2022 85.9    MCH 04/20/2022 28.5    MCHC 04/20/2022 33.2    RDW 04/20/2022 12.6    Platelets 04/20/2022 322    MPV 04/20/2022 9.4    Neutro Abs 04/20/2022 3,784    Lymphs Abs 04/20/2022 867    Absolute Monocytes 04/20/2022 428    Eosinophils Absolute 04/20/2022 10 (L)    Basophils Absolute 04/20/2022 10    Neutrophils Relative % 04/20/2022 74.2    Total Lymphocyte 04/20/2022 17.0    Monocytes Relative 04/20/2022 8.4    Eosinophils Relative 04/20/2022 0.2    Basophils Relative 04/20/2022 0.2   Orders Only on 03/23/2022  Component Date Value   Hgb A1c MFr Bld 03/23/2022 5.5    Mean Plasma Glucose 03/23/2022 111    eAG (mmol/L) 03/23/2022 6.2    Cholesterol 03/23/2022 164    HDL 03/23/2022 56    Triglycerides 03/23/2022 52    LDL Cholesterol (Calc) 03/23/2022 95    Total CHOL/HDL Ratio 03/23/2022 2.9    Non-HDL Cholesterol (Cal* 03/23/2022 108    ANCA SCREEN 03/23/2022 Negative    Myeloperoxidase Abs 03/23/2022 <1.0    Serine Protease 3 03/23/2022 <1.0    SSA (Ro) (ENA) Antibody,* 03/23/2022 >8.0 POS (A)    Sed Rate 03/23/2022 43 (H)    C3 Complement 03/23/2022 125    C4 Complement 03/23/2022 22    ds DNA Ab  03/23/2022 1    Glucose, Bld 03/23/2022 71    BUN 03/23/2022 13    Creat 03/23/2022 0.90    eGFR 03/23/2022 71    BUN/Creatinine Ratio 03/23/2022 SEE NOTE:    Sodium 03/23/2022 135    Potassium 03/23/2022 4.0    Chloride 03/23/2022 102    CO2 03/23/2022 23    Calcium 03/23/2022 9.2    Total Protein 03/23/2022 7.8    Albumin 03/23/2022 4.3    Globulin 03/23/2022 3.5    AG Ratio 03/23/2022 1.2    Total Bilirubin 03/23/2022 0.8  Alkaline phosphatase (AP* 03/23/2022 63    AST 03/23/2022 45 (H)    ALT 03/23/2022 9    WBC 03/23/2022 2.9 (L)    RBC 03/23/2022 4.30    Hemoglobin 03/23/2022 12.4    HCT 03/23/2022 36.9    MCV 03/23/2022 85.8    MCH 03/23/2022 28.8    MCHC 03/23/2022 33.6    RDW 03/23/2022 12.7    Platelets 03/23/2022 246    MPV 03/23/2022 9.6    Neutro Abs 03/23/2022 1,984    Lymphs Abs 03/23/2022 676 (L)    Absolute Monocytes 03/23/2022 232    Eosinophils Absolute 03/23/2022 0 (L)    Basophils Absolute 03/23/2022 9    Neutrophils Relative % 03/23/2022 68.4    Total Lymphocyte 03/23/2022 23.3    Monocytes Relative 03/23/2022 8.0    Eosinophils Relative 03/23/2022 0.0    Basophils Relative 03/23/2022 0.3    Creatinine, Urine 03/23/2022 82    Protein/Creat Ratio 03/23/2022 49    Protein/Creatinine Ratio 03/23/2022 0.049    Total Protein, Urine 03/23/2022 4 (L)    Anti Nuclear Antibody (A* 03/23/2022 POSITIVE (A)    ANA Titer 1 03/23/2022 1:1,280 (H)    ANA Pattern 1 03/23/2022 Nuclear, Fine Speckled (A)   Orders Only on 12/26/2021  Component Date Value   Glucose, Bld 12/26/2021 92    BUN 12/26/2021 15    Creat 12/26/2021 0.82    eGFR 12/26/2021 79    BUN/Creatinine Ratio 12/26/2021 SEE NOTE:    Sodium 12/26/2021 135    Potassium 12/26/2021 4.3    Chloride 12/26/2021 103    CO2 12/26/2021 25    Calcium 12/26/2021 9.1    Total Protein 12/26/2021 7.7    Albumin 12/26/2021 4.2    Globulin 12/26/2021 3.5    AG Ratio 12/26/2021 1.2    Total Bilirubin  12/26/2021 0.6    Alkaline phosphatase (AP* 12/26/2021 74    AST 12/26/2021 39 (H)    ALT 12/26/2021 9    WBC 12/26/2021 4.8    RBC 12/26/2021 4.20    Hemoglobin 12/26/2021 12.3    HCT 12/26/2021 36.6    MCV 12/26/2021 87.1    MCH 12/26/2021 29.3    MCHC 12/26/2021 33.6    RDW 12/26/2021 12.6    Platelets 12/26/2021 244    MPV 12/26/2021 9.8    Neutro Abs 12/26/2021 3,926    Lymphs Abs 12/26/2021 547 (L)    Absolute Monocytes 12/26/2021 307    Eosinophils Absolute 12/26/2021 0 (L)    Basophils Absolute 12/26/2021 19    Neutrophils Relative % 12/26/2021 81.8    Total Lymphocyte 12/26/2021 11.4    Monocytes Relative 12/26/2021 6.4    Eosinophils Relative 12/26/2021 0.0    Basophils Relative 12/26/2021 0.4   Orders Only on 09/15/2021  Component Date Value   WBC 09/15/2021 4.5    RBC 09/15/2021 4.32    Hemoglobin 09/15/2021 12.6    HCT 09/15/2021 37.7    MCV 09/15/2021 87.3    MCH 09/15/2021 29.2    MCHC 09/15/2021 33.4    RDW 09/15/2021 12.5    Platelets 09/15/2021 225    MPV 09/15/2021 9.9    Neutro Abs 09/15/2021 3,276    Lymphs Abs 09/15/2021 756 (L)    Absolute Monocytes 09/15/2021 450    Eosinophils Absolute 09/15/2021 0 (L)    Basophils Absolute 09/15/2021 18    Neutrophils Relative % 09/15/2021 72.8    Total Lymphocyte 09/15/2021 16.8    Monocytes Relative 09/15/2021 10.0  Eosinophils Relative 09/15/2021 0.0    Basophils Relative 09/15/2021 0.4    Glucose, Bld 09/15/2021 82    BUN 09/15/2021 15    Creat 09/15/2021 0.95    eGFR 09/15/2021 66    BUN/Creatinine Ratio 09/15/2021 SEE NOTE:    Sodium 09/15/2021 133 (L)    Potassium 09/15/2021 3.9    Chloride 09/15/2021 100    CO2 09/15/2021 27    Calcium 09/15/2021 9.0    Total Protein 09/15/2021 7.7    Albumin 09/15/2021 4.2    Globulin 09/15/2021 3.5    AG Ratio 09/15/2021 1.2    Total Bilirubin 09/15/2021 1.0    Alkaline phosphatase (AP* 09/15/2021 67    AST 09/15/2021 38 (H)    ALT 09/15/2021 10    Office Visit on 09/08/2021  Component Date Value   TSH 09/08/2021 3.22    No image results found.   No results found.  No results found.     Additional Info: This encounter employed real-time, collaborative documentation. The patient actively reviewed and updated their medical record on a shared screen, ensuring transparency and facilitating joint problem-solving for the problem list, overview, and plan. This approach promotes accurate, informed care. The treatment plan was discussed and reviewed in detail, including medication safety, potential side effects, and all patient questions. We confirmed understanding and comfort with the plan. Follow-up instructions were established, including contacting the office for any concerns, returning if symptoms worsen, persist, or new symptoms develop, and precautions for potential emergency department visits.

## 2022-09-08 NOTE — Patient Instructions (Addendum)
It was a pleasure seeing you today! Your health and satisfaction are our top priorities.   Glenetta Hew, MD  VISIT SUMMARY:  During your visit, we discussed your symptoms suggestive of COVID-19 and your concerns about the impact of the virus on your existing conditions, particularly given your immunosuppressed status. We also discussed your concerns about the potential impact of the proposed Paxlovid treatment on your liver function. Your vasculitis is well-controlled with medication, and you continue to manage your dry eyes due to Sjogren's with a contact lens. We also discussed your concerns about potentially transmitting the virus to others.  YOUR PLAN:  -COVID-19: You are showing mild symptoms of COVID-19. We have prescribed Paxlovid, a medication that can help reduce the severity and duration of your symptoms, as well as your contagiousness. Please avoid taking Paxlovid with vitamin C and garlic within the same hour to prevent absorption issues.  -CONNECTIVE TISSUE DISEASE: Your connective tissue disease is currently managed with azathioprine. We have decided to continue this medication during your COVID-19 infection unless your rheumatologist advises otherwise. Please contact your rheumatologist for further guidance if needed.  INSTRUCTIONS:  Monitor your symptoms and contact the on-call service over the weekend if necessary. We will schedule a check-in after your recovery from COVID-19 to assess your disease control and medication management.  Next Steps:  [x]  Flexible Follow-Up: We recommend a follow up with your primary care, a specialist, or me within 1-2 weeks for ensuring your problem resolves. This allows for progress monitoring and treatment adjustments. [x]  Early Intervention: Schedule sooner appointment, call our on-call services, or go to emergency room if there is Increase in pain or discomfort New or worsening symptoms Sudden or severe changes in your health [x]  Lab &  X-ray Appointments: complete or schedule to complete today, or call to schedule.  X-rays: [facility] (M-F, 8:30am-noon or 1pm-5pm).  Making the Most of Our Focused (20 minute) Follow Up Appointments:  [x]   Clearly state your top concerns at the beginning of the visit to focus our discussion [x]   If you anticipate you will need more time, please inform the front desk during scheduling - we can book multiple appointments in the same week. [x]   If you have transportation problems- use our convenient video appointments or ask about transportation support. [x]   We can get down to business faster if you use MyChart to update information before the visit and submit non-urgent questions before your visit. Thank you for taking the time to provide details through MyChart.  Let our nurse know and she can import this information into your encounter documents.  Arrival and Wait Times: [x]   Arriving on time ensures that everyone receives prompt attention. [x]   Early morning (8a) and afternoon (1p) appointments tend to have shortest wait times. [x]   Unfortunately, we cannot delay appointments for late arrivals or hold slots during phone calls.  Getting Answers and Following Up  [x]   Simple Questions & Concerns: For quick questions or basic follow-up after your visit, reach [place] at [phone] or MyChart messaging. [x]   Complex Concerns: If your concern is more complex, scheduling an appointment might be best. Discuss this with the staff to find the most suitable option. [x]   Lab & Imaging Results: We'll contact you directly if results are abnormal or you don't use MyChart. Most normal results will be on MyChart within 2-3 business days, with a review message from Dr. Jon Billings. Haven't heard back in 2 weeks? Need results sooner? Contact  us at [phone]. [x]   Referrals: Our referral coordinator will manage specialist referrals. The specialist's office should contact you within 2 weeks to  schedule an appointment. Call us if you haven't heard from them after 2 weeks.  Staying Connected  [x]   MyChart: Activate your MyChart for the fastest way to access results and message [place]. See the last page of this paperwork for instructions on how to activate.  Bring to Your Next Appointment  [x]   Medications: Please bring all your medication bottles to your next appointment to ensure we have an accurate record of your prescriptions. [x]   Health Diaries: If you're monitoring any health conditions at home, keeping a diary of your readings can be very helpful for discussions at your next appointment.  Billing  [x]   X-ray & Lab Orders: These are billed by separate companies. Contact the invoicing company directly for questions or concerns. [x]   Visit Charges: Discuss any billing inquiries with our administrative services team.  Your Satisfaction Matters  [x]   Share Your Experience: We strive for your satisfaction! If you have any complaints, or preferably compliments, please let Dr. Jon Billings know directly or contact our Practice Administrators, Edwena Felty or [employer], by asking at the front desk.   Reviewing Your Records  [x]   Review this early draft of your clinical encounter notes below and the final encounter summary tomorrow on MyChart after its been completed.    COVID -     nirmatrelvir/ritonavir; Take 3 tablets by mouth 2 (two) times daily for 5 days. (Take nirmatrelvir 150 mg two tablets twice daily for 5 days and ritonavir 100 mg one tablet twice daily for 5 days) Patient GFR is over 60  Dispense: 30 tablet; Refill: 0

## 2022-09-14 ENCOUNTER — Ambulatory Visit: Payer: PPO | Admitting: Radiology

## 2022-09-21 ENCOUNTER — Other Ambulatory Visit: Payer: Self-pay | Admitting: *Deleted

## 2022-09-21 DIAGNOSIS — Z79899 Other long term (current) drug therapy: Secondary | ICD-10-CM | POA: Diagnosis not present

## 2022-09-21 DIAGNOSIS — D72810 Lymphocytopenia: Secondary | ICD-10-CM

## 2022-09-21 DIAGNOSIS — M3509 Sicca syndrome with other organ involvement: Secondary | ICD-10-CM

## 2022-09-21 DIAGNOSIS — I776 Arteritis, unspecified: Secondary | ICD-10-CM

## 2022-09-25 ENCOUNTER — Telehealth: Payer: Self-pay | Admitting: *Deleted

## 2022-09-25 DIAGNOSIS — Z79899 Other long term (current) drug therapy: Secondary | ICD-10-CM

## 2022-09-25 LAB — COMPLETE METABOLIC PANEL WITH GFR
AG Ratio: 1.1 (calc) (ref 1.0–2.5)
ALT: 9 U/L (ref 6–29)
AST: 34 U/L (ref 10–35)
Albumin: 4 g/dL (ref 3.6–5.1)
Alkaline phosphatase (APISO): 76 U/L (ref 37–153)
BUN: 11 mg/dL (ref 7–25)
CO2: 24 mmol/L (ref 20–32)
Calcium: 8.9 mg/dL (ref 8.6–10.4)
Chloride: 103 mmol/L (ref 98–110)
Creat: 0.94 mg/dL (ref 0.50–1.05)
Globulin: 3.7 g/dL (ref 1.9–3.7)
Glucose, Bld: 93 mg/dL (ref 65–99)
Potassium: 4.2 mmol/L (ref 3.5–5.3)
Sodium: 136 mmol/L (ref 135–146)
Total Bilirubin: 0.5 mg/dL (ref 0.2–1.2)
Total Protein: 7.7 g/dL (ref 6.1–8.1)
eGFR: 67 mL/min/{1.73_m2} (ref >=60)

## 2022-09-25 LAB — CBC WITH DIFFERENTIAL/PLATELET
Absolute Monocytes: 251 {cells}/uL (ref 200–950)
Basophils Absolute: 10 {cells}/uL (ref 0–200)
Basophils Relative: 0.3 %
Eosinophils Absolute: 0 {cells}/uL — ABNORMAL LOW (ref 15–500)
Eosinophils Relative: 0 %
HCT: 37.9 % (ref 35.0–45.0)
Hemoglobin: 12.4 g/dL (ref 11.7–15.5)
Lymphs Abs: 455 {cells}/uL — ABNORMAL LOW (ref 850–3900)
MCH: 28.6 pg (ref 27.0–33.0)
MCHC: 32.7 g/dL (ref 32.0–36.0)
MCV: 87.5 fL (ref 80.0–100.0)
MPV: 9.5 fL (ref 7.5–12.5)
Monocytes Relative: 7.6 %
Neutro Abs: 2584 {cells}/uL (ref 1500–7800)
Neutrophils Relative %: 78.3 %
Platelets: 252 10*3/uL (ref 140–400)
RBC: 4.33 10*6/uL (ref 3.80–5.10)
RDW: 12.3 % (ref 11.0–15.0)
Total Lymphocyte: 13.8 %
WBC: 3.3 10*3/uL — ABNORMAL LOW (ref 3.8–10.8)

## 2022-09-25 LAB — PROTEIN ELECTROPHORESIS, SERUM, WITH REFLEX
Albumin ELP: 4 g/dL (ref 3.8–4.8)
Alpha 1: 0.3 g/dL (ref 0.2–0.3)
Alpha 2: 0.8 g/dL (ref 0.5–0.9)
Beta 2: 0.4 g/dL (ref 0.2–0.5)
Beta Globulin: 0.4 g/dL (ref 0.4–0.6)
Gamma Globulin: 1.8 g/dL — ABNORMAL HIGH (ref 0.8–1.7)
Total Protein: 7.6 g/dL (ref 6.1–8.1)

## 2022-09-25 LAB — URINALYSIS, ROUTINE W REFLEX MICROSCOPIC
Bilirubin Urine: NEGATIVE
Glucose, UA: NEGATIVE
Hgb urine dipstick: NEGATIVE
Ketones, ur: NEGATIVE
Leukocytes,Ua: NEGATIVE
Nitrite: NEGATIVE
Protein, ur: NEGATIVE
Specific Gravity, Urine: 1.013 (ref 1.001–1.035)
pH: 6.5 (ref 5.0–8.0)

## 2022-09-25 LAB — C3 AND C4
C3 Complement: 129 mg/dL (ref 83–193)
C4 Complement: 18 mg/dL (ref 15–57)

## 2022-09-25 LAB — SJOGRENS SYNDROME-A EXTRACTABLE NUCLEAR ANTIBODY: SSA (Ro) (ENA) Antibody, IgG: >8 AI — AB

## 2022-09-25 LAB — ANA: Anti Nuclear Antibody (ANA): POSITIVE — AB

## 2022-09-25 LAB — ANTI-NUCLEAR AB-TITER (ANA TITER)
ANA TITER: 1:40 {titer} — ABNORMAL HIGH
ANA Titer 1: 1:1280 {titer} — ABNORMAL HIGH

## 2022-09-25 LAB — SEDIMENTATION RATE: Sed Rate: 45 mm/h — ABNORMAL HIGH (ref 0–30)

## 2022-09-25 LAB — RHEUMATOID FACTOR: Rheumatoid fact SerPl-aCnc: 11 [IU]/mL (ref <14)

## 2022-09-25 NOTE — Telephone Encounter (Signed)
-----   Message from Gearldine Bienenstock sent at 09/25/2022  3:42 PM EDT ----- ANA and Ro antibody remain positive.   WBC count remains low-3.3. absolute lymphocytes are low. Absolute eosinophils are 0.  She remains on low dose imuran.  Recheck CBC with diff in 1 month.  ESR remains elevated but stable-45.   SPEP did not reveal any abnormal protein bands.   RF negative.  Complements WNL.  UA normal.

## 2022-09-25 NOTE — Progress Notes (Signed)
ANA and Ro antibody remain positive.   WBC count remains low-3.3. absolute lymphocytes are low. Absolute eosinophils are 0.  She remains on low dose imuran.  Recheck CBC with diff in 1 month.  ESR remains elevated but stable-45.   SPEP did not reveal any abnormal protein bands.   RF negative.  Complements WNL.  UA normal.

## 2022-10-02 ENCOUNTER — Other Ambulatory Visit: Payer: Self-pay

## 2022-10-10 ENCOUNTER — Other Ambulatory Visit: Payer: Self-pay

## 2022-10-10 ENCOUNTER — Other Ambulatory Visit (HOSPITAL_COMMUNITY): Payer: Self-pay

## 2022-10-10 DIAGNOSIS — E063 Autoimmune thyroiditis: Secondary | ICD-10-CM

## 2022-10-10 MED ORDER — SYNTHROID 75 MCG PO TABS
75.0000 ug | ORAL_TABLET | Freq: Every day | ORAL | 0 refills | Status: DC
Start: 2022-10-10 — End: 2023-01-19
  Filled 2022-10-10: qty 90, 90d supply, fill #0

## 2022-10-10 NOTE — Telephone Encounter (Signed)
Med refill request: synthroid Last AEX: 09/08/2021-ML Next AEX: 11/09/2022-JC Last MMG (if hormonal med): n/a Refill authorized: rx pend.  **last TSH checked on 09/08/2021 @ 3.22

## 2022-10-11 ENCOUNTER — Other Ambulatory Visit (HOSPITAL_COMMUNITY): Payer: Self-pay

## 2022-10-11 ENCOUNTER — Ambulatory Visit (INDEPENDENT_AMBULATORY_CARE_PROVIDER_SITE_OTHER): Payer: PPO

## 2022-10-11 DIAGNOSIS — Z23 Encounter for immunization: Secondary | ICD-10-CM | POA: Diagnosis not present

## 2022-10-11 MED ORDER — COVID-19 MRNA VAC-TRIS(PFIZER) 30 MCG/0.3ML IM SUSY
0.3000 mL | PREFILLED_SYRINGE | Freq: Once | INTRAMUSCULAR | 0 refills | Status: AC
  Filled 2022-10-11: qty 0.3, 1d supply, fill #0

## 2022-10-27 ENCOUNTER — Encounter: Payer: Self-pay | Admitting: Radiology

## 2022-11-09 ENCOUNTER — Other Ambulatory Visit (HOSPITAL_COMMUNITY): Payer: Self-pay

## 2022-11-09 ENCOUNTER — Ambulatory Visit: Payer: PPO | Admitting: Radiology

## 2022-11-09 ENCOUNTER — Other Ambulatory Visit: Payer: Self-pay

## 2022-11-09 ENCOUNTER — Encounter: Payer: PPO | Admitting: Radiology

## 2022-11-09 ENCOUNTER — Encounter: Payer: Self-pay | Admitting: Radiology

## 2022-11-09 ENCOUNTER — Other Ambulatory Visit: Payer: Self-pay | Admitting: Physician Assistant

## 2022-11-09 MED ORDER — AZATHIOPRINE 50 MG PO TABS
50.0000 mg | ORAL_TABLET | Freq: Every day | ORAL | 0 refills | Status: DC
  Filled 2022-11-09: qty 90, 90d supply, fill #0

## 2022-11-09 NOTE — Telephone Encounter (Signed)
Last Fill: 08/21/2022  Labs: 09/21/2022 WBC count remains low-3.3. absolute lymphocytes are low. Absolute eosinophils are 0   Next Visit: 01/25/2023  Last Visit: 07/20/2022  DX: Sjogren's syndrome with other organ involvement   Current Dose per office note 07/20/2022: Imuran 50 mg 1 tablet by mouth daily   Okay to refill Imuran?

## 2022-12-21 ENCOUNTER — Other Ambulatory Visit: Payer: Self-pay | Admitting: *Deleted

## 2022-12-21 DIAGNOSIS — Z79899 Other long term (current) drug therapy: Secondary | ICD-10-CM | POA: Diagnosis not present

## 2022-12-22 LAB — COMPLETE METABOLIC PANEL WITH GFR
AG Ratio: 1.2 (calc) (ref 1.0–2.5)
ALT: 13 U/L (ref 6–29)
AST: 43 U/L — ABNORMAL HIGH (ref 10–35)
Albumin: 4.3 g/dL (ref 3.6–5.1)
Alkaline phosphatase (APISO): 79 U/L (ref 37–153)
BUN: 18 mg/dL (ref 7–25)
CO2: 27 mmol/L (ref 20–32)
Calcium: 9.9 mg/dL (ref 8.6–10.4)
Chloride: 101 mmol/L (ref 98–110)
Creat: 0.88 mg/dL (ref 0.50–1.05)
Globulin: 3.7 g/dL (ref 1.9–3.7)
Glucose, Bld: 87 mg/dL (ref 65–99)
Potassium: 4.6 mmol/L (ref 3.5–5.3)
Sodium: 137 mmol/L (ref 135–146)
Total Bilirubin: 0.7 mg/dL (ref 0.2–1.2)
Total Protein: 8 g/dL (ref 6.1–8.1)
eGFR: 72 mL/min/{1.73_m2} (ref >=60)

## 2022-12-22 LAB — CBC WITH DIFFERENTIAL/PLATELET
Absolute Lymphocytes: 700 {cells}/uL — ABNORMAL LOW (ref 850–3900)
Absolute Monocytes: 409 {cells}/uL (ref 200–950)
Basophils Absolute: 19 {cells}/uL (ref 0–200)
Basophils Relative: 0.4 %
Eosinophils Absolute: 0 {cells}/uL — ABNORMAL LOW (ref 15–500)
Eosinophils Relative: 0 %
HCT: 40.4 % (ref 35.0–45.0)
Hemoglobin: 13.5 g/dL (ref 11.7–15.5)
MCH: 29.2 pg (ref 27.0–33.0)
MCHC: 33.4 g/dL (ref 32.0–36.0)
MCV: 87.3 fL (ref 80.0–100.0)
MPV: 9.4 fL (ref 7.5–12.5)
Monocytes Relative: 8.7 %
Neutro Abs: 3572 {cells}/uL (ref 1500–7800)
Neutrophils Relative %: 76 %
Platelets: 296 10*3/uL (ref 140–400)
RBC: 4.63 10*6/uL (ref 3.80–5.10)
RDW: 13 % (ref 11.0–15.0)
Total Lymphocyte: 14.9 %
WBC: 4.7 10*3/uL (ref 3.8–10.8)

## 2022-12-22 NOTE — Progress Notes (Signed)
Tylenol would be safer than NSAIDs.  She may discuss with her PCP or see a neurologist for other treatment option.

## 2022-12-22 NOTE — Progress Notes (Signed)
CBC and CMP are stable.  AST is elevated at 43.  Patient should avoid all NSAIDs, Tylenol and alcohol use.

## 2023-01-11 NOTE — Progress Notes (Signed)
Office Visit Note  Patient: Jennifer Barr             Date of Birth: 1955-03-28           MRN: [id number]             PCP: Ardith Dark, MD Referring: Ardith Dark, MD Visit Date: 01/25/2023 Occupation: @GUAROCC @  Subjective:  Discoloration of toes  History of Present Illness: KEARSTIN HINDES is a 68 y.o. female with Sjogren's, vasculitis and osteoarthritis.  She returns today after last visit on July 20, 2022.  She states she has been having increased Raynauds symptoms especially in her toes with the colder weather.  She has been trying to wear socks and shoes on a regular basis.  She has not noticed much discoloration in her hands.  She continues to have dry mouth and dry eyes.  She states she had a lingering infection during November.  She had laryngitis for a long time which is gradually resolving.  She has not had a flare of vasculitis.  She denies any history of lymphadenopathy.  She denies any shortness of breath.    Activities of Daily Living:  Patient reports morning stiffness for 0 minute.   Patient Reports nocturnal pain.  Difficulty dressing/grooming: Denies Difficulty climbing stairs: Denies Difficulty getting out of chair: Denies Difficulty using hands for taps, buttons, cutlery, and/or writing: Denies  Review of Systems  Constitutional:  Positive for fatigue.  HENT:  Positive for mouth dryness. Negative for mouth sores.   Eyes:  Positive for dryness.  Respiratory:  Negative for shortness of breath.   Cardiovascular:  Negative for chest pain and palpitations.  Gastrointestinal:  Positive for constipation. Negative for blood in stool and diarrhea.  Endocrine: Negative for increased urination.  Genitourinary:  Negative for involuntary urination.  Musculoskeletal:  Negative for joint pain, gait problem, joint pain, joint swelling, myalgias, muscle weakness, morning stiffness, muscle tenderness and myalgias.  Skin:  Positive for hair loss and sensitivity to sunlight.  Negative for color change and rash.  Allergic/Immunologic: Negative for susceptible to infections.  Neurological:  Negative for dizziness and headaches.  Hematological:  Negative for swollen glands.  Psychiatric/Behavioral:  Positive for sleep disturbance. Negative for depressed mood. The patient is not nervous/anxious.     PMFS History:  Patient Active Problem List   Diagnosis Date Noted   Cold sore 03/23/2022   Peripheral neuropathy 12/14/2020   Acute parotitis 12/03/2017   ANA positive 02/10/2016   Rheumatoid factor positive 02/10/2016   Primary osteoarthritis, right hand 02/10/2016   Scoliosis 02/10/2016   Raynaud's syndrome without gangrene 02/10/2016   Sjogren's disease (HCC) 05/07/2014   Vasculitis (HCC) 05/07/2014   Hashimoto's thyroiditis 05/07/2014    Past Medical History:  Diagnosis Date   HSV infection    Osteopenia 07/2017   T score -1.3 FRAX 7.2% / 0.6%   Premature ovarian failure    Sjoegren syndrome    Dr. Corliss Skains   Thyroid disease    Hashiomto   Vasculitis (HCC)    Dr. Corliss Skains    Family History  Problem Relation Age of Onset   Diabetes Mother    Diabetes Father    Scleroderma Daughter    Past Surgical History:  Procedure Laterality Date   TUBAL LIGATION     Social History   Social History Narrative   Not on file   Immunization History  Administered Date(s) Administered   Fluad Quad(high Dose 65+) 10/19/2021   Fluad Trivalent(High  Dose 65+) 10/11/2022   Influenza Inj Mdck Quad Pf 10/25/2020   Influenza, High Dose Seasonal PF 10/14/2018   Influenza,inj,Quad PF,6+ Mos 10/14/2018   Influenza-Unspecified 10/25/2020   Moderna Sars-Covid-2 Vaccination 03/25/2019, 04/22/2019, 09/01/2019   PNEUMOCOCCAL CONJUGATE-20 10/19/2021   Pfizer(Comirnaty)Fall Seasonal Vaccine 12 years and older 10/11/2022   Tdap 06/06/2018   Zoster Recombinant(Shingrix) 10/25/2020, 03/01/2021     Objective: Vital Signs: BP 117/70 (BP Location: Left Arm, Patient  Position: Sitting, Cuff Size: Normal)   Pulse 76   Resp 12   Ht 5\' 8"  (1.727 m)   Wt 154 lb (69.9 kg)   BMI 23.42 kg/m    Physical Exam Vitals and nursing note reviewed.  Constitutional:      Appearance: She is well-developed.  HENT:     Head: Normocephalic and atraumatic.  Eyes:     Conjunctiva/sclera: Conjunctivae normal.  Cardiovascular:     Rate and Rhythm: Normal rate and regular rhythm.     Heart sounds: Normal heart sounds.  Pulmonary:     Effort: Pulmonary effort is normal.     Breath sounds: Normal breath sounds.  Abdominal:     General: Bowel sounds are normal.     Palpations: Abdomen is soft.  Musculoskeletal:     Cervical back: Normal range of motion.  Lymphadenopathy:     Cervical: No cervical adenopathy.  Skin:    General: Skin is warm and dry.     Capillary Refill: Capillary refill takes less than 2 seconds.  Neurological:     Mental Status: She is alert and oriented to person, place, and time.  Psychiatric:        Behavior: Behavior normal.      Musculoskeletal Exam: Cervical, thoracic and lumbar spine 1 good range of motion.  Shoulders, elbows, wrists, MCPs PIPs and DIPs Juengel range of motion with no synovitis.  Hip joints and knee joints in good range of motion.  There was no tenderness over ankles or MTPs.  CDAI Exam: CDAI Score: -- Patient Global: --; Provider Global: -- Swollen: --; Tender: -- Joint Exam 01/25/2023   No joint exam has been documented for this visit   There is currently no information documented on the homunculus. Go to the Rheumatology activity and complete the homunculus joint exam.  Investigation: No additional findings.  Imaging: No results found.  Recent Labs: Lab Results  Component Value Date   WBC 4.7 12/21/2022   HGB 13.5 12/21/2022   PLT 296 12/21/2022   NA 137 12/21/2022   K 4.6 12/21/2022   CL 101 12/21/2022   CO2 27 12/21/2022   GLUCOSE 87 12/21/2022   BUN 18 12/21/2022   CREATININE 0.88 12/21/2022    BILITOT 0.7 12/21/2022   ALKPHOS 81 09/01/2019   AST 43 (H) 12/21/2022   ALT 13 12/21/2022   PROT 8.0 12/21/2022   ALBUMIN 4.3 09/01/2019   CALCIUM 9.9 12/21/2022   GFRAA 79 06/17/2020    Speciality Comments: PLQ Eye Exam: 04/22/2019 WNL @ Blima Ledger, OD PA  Procedures:  No procedures performed Allergies: Sulfa antibiotics   Assessment / Plan:     Visit Diagnoses: Sjogren's syndrome with other organ involvement (HCC) - positive ANA, positive RF, positive Ro, positive La.  Rheumatoid factor was negative and SPEP was normal in August 2020.  She continues to have dry mouth and dry eye symptoms.  Over-the-counter products were discussed.  She has tried pilocarpine in the past and did not notice any benefit.  She continues to be  on Imuran 50 mg p.o. daily.  Increased risk of ILD and lymphoma with Sjogren's was discussed.  She denies any increased shortness of breath or lymphadenopathy.  High risk medication use - - Imuran 50 mg 1 tablet by mouth daily. (PLQ was discontinued and June due to lymphopenia).  Labs from December 21, 2022 CBC and CMP were stable except for mild elevation of AST.  We will continue to monitor labs every 3 months.  Information for immunization was placed in the AVS.  Lymphopenia - - Dr. Gwyneth Sprout blood cell count was 3.7, absolute lymphocytes 710, absolute eosinophils 11 on 06/22/2022  Vasculitis (HCC) -patient denies any recent symptoms.  She had no recurrence since July 2020. Pan-ANCA negative on 03/23/22.    Raynaud's syndrome without gangrene-she is having increased Raynaud's symptoms.  Warm clothing was discussed.  If her symptoms get worse we can consider amlodipine in the future.  Blood pressure runs low.  Primary osteoarthritis of both hands-joint protection muscle strengthening was discussed.  Scoliosis  History of hypothyroidism  Osteopenia of multiple sites -September 28, 2021 T-score -1.5 AP spine.  Use of calcium rich diet and vitamin D was  discussed.  DEXA is followed by her GYN.    Orders: No orders of the defined types were placed in this encounter.  No orders of the defined types were placed in this encounter.    Follow-Up Instructions: Return in about 5 months (around 06/25/2023) for Sjogren's.   Pollyann Savoy, MD  Note - This record has been created using Animal nutritionist.  Chart creation errors have been sought, but may not always  have been located. Such creation errors do not reflect on  the standard of medical care.

## 2023-01-14 ENCOUNTER — Other Ambulatory Visit: Payer: Self-pay | Admitting: Radiology

## 2023-01-14 DIAGNOSIS — E063 Autoimmune thyroiditis: Secondary | ICD-10-CM

## 2023-01-15 NOTE — Telephone Encounter (Signed)
 Medication refill request: synthroid 75 mcg  Last AEX:  09/08/21 Next AEX: not scheduled  Last MMG (if hormonal medication request): n/a Refill authorized: patient's last tsh in chart is 09/08/21 please advise.

## 2023-01-16 ENCOUNTER — Other Ambulatory Visit: Payer: Self-pay

## 2023-01-16 NOTE — Telephone Encounter (Signed)
 Needs AEX scheduled then will fill

## 2023-01-19 ENCOUNTER — Other Ambulatory Visit: Payer: Self-pay | Admitting: Radiology

## 2023-01-19 ENCOUNTER — Other Ambulatory Visit: Payer: Self-pay

## 2023-01-19 ENCOUNTER — Other Ambulatory Visit (HOSPITAL_COMMUNITY): Payer: Self-pay

## 2023-01-19 DIAGNOSIS — E063 Autoimmune thyroiditis: Secondary | ICD-10-CM

## 2023-01-19 MED ORDER — SYNTHROID 75 MCG PO TABS
75.0000 ug | ORAL_TABLET | Freq: Every day | ORAL | 0 refills | Status: DC
  Filled 2023-01-19: qty 90, 90d supply, fill #0

## 2023-01-19 NOTE — Telephone Encounter (Signed)
 PCP should manage for medicare patients. Will refill this last time.

## 2023-01-19 NOTE — Telephone Encounter (Signed)
 Med refill request: synthroid Last AEX: 08/31/21 low risk medicare Next AEX: none scheduled Last MMG (if hormonal med) n/a Last refill 10/10/22 synthroid #90 Refill sent to provider for approval or denial as appropriate.

## 2023-01-22 NOTE — Telephone Encounter (Unsigned)
 Copied from CRM [id number]. Topic: Clinical - Prescription Issue >> Jan 19, 2023  9:26 AM Cherylynn B wrote: Reason for CRM: Patient states that her Gynecologist has been refilling her rx SYNTHROID  75 MCG tablet for some time, but they now have decided to stop refilling it and advised her to begin getting it filled through her PCP Dr Worth Kitty at [facility]. Patient would like to know how she can start the process of changing rx from previous doctor to Dr Kitty as prescribing doctor. Also stated she cannot do the generic version as she is allergic and breaks out into rash, must be Synthroid . Callback [phone]

## 2023-01-25 ENCOUNTER — Encounter: Payer: Self-pay | Admitting: Rheumatology

## 2023-01-25 ENCOUNTER — Ambulatory Visit: Payer: PPO | Attending: Rheumatology | Admitting: Rheumatology

## 2023-01-25 VITALS — BP 117/70 | HR 76 | Resp 12 | Ht 68.0 in | Wt 154.0 lb

## 2023-01-25 DIAGNOSIS — M419 Scoliosis, unspecified: Secondary | ICD-10-CM

## 2023-01-25 DIAGNOSIS — D72810 Lymphocytopenia: Secondary | ICD-10-CM

## 2023-01-25 DIAGNOSIS — M19042 Primary osteoarthritis, left hand: Secondary | ICD-10-CM

## 2023-01-25 DIAGNOSIS — M19041 Primary osteoarthritis, right hand: Secondary | ICD-10-CM | POA: Diagnosis not present

## 2023-01-25 DIAGNOSIS — M3509 Sicca syndrome with other organ involvement: Secondary | ICD-10-CM

## 2023-01-25 DIAGNOSIS — Z79899 Other long term (current) drug therapy: Secondary | ICD-10-CM | POA: Diagnosis not present

## 2023-01-25 DIAGNOSIS — I776 Arteritis, unspecified: Secondary | ICD-10-CM

## 2023-01-25 DIAGNOSIS — Z8639 Personal history of other endocrine, nutritional and metabolic disease: Secondary | ICD-10-CM | POA: Diagnosis not present

## 2023-01-25 DIAGNOSIS — M8589 Other specified disorders of bone density and structure, multiple sites: Secondary | ICD-10-CM | POA: Diagnosis not present

## 2023-01-25 DIAGNOSIS — I7300 Raynaud's syndrome without gangrene: Secondary | ICD-10-CM

## 2023-01-25 DIAGNOSIS — U071 COVID-19: Secondary | ICD-10-CM

## 2023-01-25 NOTE — Patient Instructions (Signed)
 Standing Labs We placed an order today for your standing lab work.   Please have your standing labs drawn in March and every 3 months  Please have your labs drawn 2 weeks prior to your appointment so that the provider can discuss your lab results at your appointment, if possible.  Please note that you may see your imaging and lab results in MyChart before we have reviewed them. We will contact you once all results are reviewed. Please allow our office up to 72 hours to thoroughly review all of the results before contacting the office for clarification of your results.  WALK-IN LAB HOURS  Monday through Thursday from 8:00 am -12:30 pm and 1:00 pm-5:00 pm and Friday from 8:00 am-12:00 pm.  Patients with office visits requiring labs will be seen before walk-in labs.  You may encounter longer than normal wait times. Please allow additional time. Wait times may be shorter on  Monday and Thursday afternoons.  We do not book appointments for walk-in labs. We appreciate your patience and understanding with our staff.   Labs are drawn by [facility]. Please bring your co-pay at the time of your lab draw.  You may receive a bill from [facility] for your lab work.  Please note if you are on Hydroxychloroquine and and an order has been placed for a Hydroxychloroquine level,  you will need to have it drawn 4 hours or more after your last dose.  If you wish to have your labs drawn at another location, please call the office 24 hours in advance so we can fax the orders.  The office is located at [address], [place], [place] [address]   If you have any questions regarding directions or hours of operation,  please call [phone].   As a reminder, please drink plenty of water prior to coming for your lab work. Thanks!   Vaccines You are taking a medication(s) that can suppress your immune system.  The following immunizations are recommended: Flu annually Covid-19  RSV Td/Tdap (tetanus,  diphtheria, pertussis) every 10 years Pneumonia (Prevnar 15 then Pneumovax 23 at least 1 year apart.  Alternatively, can take Prevnar 20 without needing additional dose) Shingrix: 2 doses from 4 weeks to 6 months apart  Please check with your PCP to make sure you are up to date.

## 2023-02-02 ENCOUNTER — Other Ambulatory Visit (HOSPITAL_COMMUNITY): Payer: Self-pay

## 2023-02-02 ENCOUNTER — Telehealth: Payer: PPO | Admitting: Family Medicine

## 2023-02-02 DIAGNOSIS — N390 Urinary tract infection, site not specified: Secondary | ICD-10-CM | POA: Diagnosis not present

## 2023-02-02 MED ORDER — CEPHALEXIN 500 MG PO CAPS
500.0000 mg | ORAL_CAPSULE | Freq: Two times a day (BID) | ORAL | 0 refills | Status: AC
Start: 2023-02-02 — End: 2023-02-09
  Filled 2023-02-02: qty 14, 7d supply, fill #0

## 2023-02-02 NOTE — Patient Instructions (Signed)

## 2023-02-02 NOTE — Progress Notes (Signed)
Virtual Visit Consent   Jennifer Barr, you are scheduled for a virtual visit with a [facility] provider today. Just as with appointments in the office, your consent must be obtained to participate. Your consent will be active for this visit and any virtual visit you may have with one of our providers in the next 365 days. If you have a MyChart account, a copy of this consent can be sent to you electronically.  As this is a virtual visit, video technology does not allow for your provider to perform a traditional examination. This may limit your provider's ability to fully assess your condition. If your provider identifies any concerns that need to be evaluated in person or the need to arrange testing (such as labs, EKG, etc.), we will make arrangements to do so. Although advances in technology are sophisticated, we cannot ensure that it will always work on either your end or our end. If the connection with a video visit is poor, the visit may have to be switched to a telephone visit. With either a video or telephone visit, we are not always able to ensure that we have a secure connection.  By engaging in this virtual visit, you consent to the provision of healthcare and authorize for your insurance to be billed (if applicable) for the services provided during this visit. Depending on your insurance coverage, you may receive a charge related to this service.  I need to obtain your verbal consent now. Are you willing to proceed with your visit today? Jennifer Barr has provided verbal consent on 02/02/2023 for a virtual visit (video or telephone). Georgana Curio, FNP  Date: 02/02/2023 12:48 PM  Virtual Visit via Video Note   I, Georgana Curio, connected with  Jennifer Barr  ([id number], 1955-08-15) on 02/02/23 at 12:45 PM EST by a video-enabled telemedicine application and verified that I am speaking with the correct person using two identifiers.  Location: Patient: Virtual Visit Location Patient:  Home Provider: Virtual Visit Location Provider: Home Office   I discussed the limitations of evaluation and management by telemedicine and the availability of in person appointments. The patient expressed understanding and agreed to proceed.    History of Present Illness: Jennifer Barr is a 68 y.o. who identifies as a female who was assigned female at birth, and is being seen today for burning and frequency with urination for 2 days. No fever or abd pain. She is allergic to sulfa. Marland Kitchen  HPI: HPI  Problems:  Patient Active Problem List   Diagnosis Date Noted   Cold sore 03/23/2022   Peripheral neuropathy 12/14/2020   Acute parotitis 12/03/2017   ANA positive 02/10/2016   Rheumatoid factor positive 02/10/2016   Primary osteoarthritis, right hand 02/10/2016   Scoliosis 02/10/2016   Raynaud's syndrome without gangrene 02/10/2016   Sjogren's disease (HCC) 05/07/2014   Vasculitis (HCC) 05/07/2014   Hashimoto's thyroiditis 05/07/2014    Allergies:  Allergies  Allergen Reactions   Sulfa Antibiotics Rash   Medications:  Current Outpatient Medications:    cephALEXin (KEFLEX) 500 MG capsule, Take 1 capsule (500 mg total) by mouth 2 (two) times daily for 7 days., Disp: 14 capsule, Rfl: 0   acyclovir ointment (ZOVIRAX) 5 %, Apply 1 Application topically every 3 (three) hours. (Patient not taking: Reported on 01/25/2023), Disp: 30 g, Rfl: 1   Ascorbic Acid (VITAMIN C PO), Take by mouth daily., Disp: , Rfl:    aspirin 81 MG tablet, Take 81 mg by mouth  daily., Disp: , Rfl:    azaTHIOprine (IMURAN) 50 MG tablet, Take 1 tablet (50 mg) by mouth daily., Disp: 90 tablet, Rfl: 0   B Complex Vitamins (VITAMIN B COMPLEX PO), Take by mouth daily., Disp: , Rfl:    Calcium 200 MG TABS, Take by mouth., Disp: , Rfl:    Magnesium 80 MG TABS, Take by mouth., Disp: , Rfl:    SYNTHROID 75 MCG tablet, Take 1 tablet (75 mcg total) by mouth daily before breakfast., Disp: 90 tablet, Rfl: 0   TYRVAYA 0.03 MG/ACT SOLN,  Place 1 spray into both nostrils 2 (two) times daily. (Patient not taking: Reported on 01/25/2023), Disp: 4.2 mL, Rfl: 2   VITAMIN D PO, Take by mouth daily., Disp: , Rfl:    VITAMIN E PO, Take by mouth., Disp: , Rfl:   Observations/Objective: Patient is well-developed, well-nourished in no acute distress.  Resting comfortably  at home.  Head is normocephalic, atraumatic.  No labored breathing.  Speech is clear and coherent with logical content.  Patient is alert and oriented at baseline.    Assessment and Plan: 1. Urinary tract infection without hematuria, site unspecified (Primary)  Increase fluids, preventative measures discussed, UC if sx worsen.   Follow Up Instructions: I discussed the assessment and treatment plan with the patient. The patient was provided an opportunity to ask questions and all were answered. The patient agreed with the plan and demonstrated an understanding of the instructions.  A copy of instructions were sent to the patient via MyChart unless otherwise noted below.     The patient was advised to call back or seek an in-person evaluation if the symptoms worsen or if the condition fails to improve as anticipated.    Georgana Curio, FNP

## 2023-02-09 ENCOUNTER — Other Ambulatory Visit: Payer: Self-pay | Admitting: Physician Assistant

## 2023-02-09 ENCOUNTER — Other Ambulatory Visit: Payer: Self-pay

## 2023-02-09 ENCOUNTER — Other Ambulatory Visit (HOSPITAL_BASED_OUTPATIENT_CLINIC_OR_DEPARTMENT_OTHER): Payer: Self-pay

## 2023-02-09 MED ORDER — AZATHIOPRINE 50 MG PO TABS
50.0000 mg | ORAL_TABLET | Freq: Every day | ORAL | 0 refills | Status: DC
  Filled 2023-02-09: qty 90, 90d supply, fill #0

## 2023-02-09 NOTE — Telephone Encounter (Signed)
Last Fill: 11/09/2022  Labs: 12/21/2022 CBC and CMP are stable.  AST is elevated at 43.  Patient should avoid all NSAIDs, Tylenol and alcohol use.   Next Visit: 07/26/2023  Last Visit: 01/25/2023  DX: Sjogren's syndrome with other organ involvement (   Current Dose per office note 01/25/2023: Imuran 50 mg p.o. daily.   Okay to refill Imuran?

## 2023-03-22 ENCOUNTER — Other Ambulatory Visit: Payer: Self-pay | Admitting: *Deleted

## 2023-03-22 DIAGNOSIS — Z79899 Other long term (current) drug therapy: Secondary | ICD-10-CM | POA: Diagnosis not present

## 2023-03-23 LAB — CBC WITH DIFFERENTIAL/PLATELET
Absolute Lymphocytes: 648 {cells}/uL — ABNORMAL LOW (ref 850–3900)
Absolute Monocytes: 346 {cells}/uL (ref 200–950)
Basophils Absolute: 22 {cells}/uL (ref 0–200)
Basophils Relative: 0.6 %
Eosinophils Absolute: 0 {cells}/uL — ABNORMAL LOW (ref 15–500)
Eosinophils Relative: 0 %
HCT: 39.7 % (ref 35.0–45.0)
Hemoglobin: 13.1 g/dL (ref 11.7–15.5)
MCH: 28.7 pg (ref 27.0–33.0)
MCHC: 33 g/dL (ref 32.0–36.0)
MCV: 87.1 fL (ref 80.0–100.0)
MPV: 9.6 fL (ref 7.5–12.5)
Monocytes Relative: 9.6 %
Neutro Abs: 2585 {cells}/uL (ref 1500–7800)
Neutrophils Relative %: 71.8 %
Platelets: 251 10*3/uL (ref 140–400)
RBC: 4.56 10*6/uL (ref 3.80–5.10)
RDW: 12.4 % (ref 11.0–15.0)
Total Lymphocyte: 18 %
WBC: 3.6 10*3/uL — ABNORMAL LOW (ref 3.8–10.8)

## 2023-03-23 LAB — COMPLETE METABOLIC PANEL WITH GFR
AG Ratio: 1.1 (calc) (ref 1.0–2.5)
ALT: 9 U/L (ref 6–29)
AST: 35 U/L (ref 10–35)
Albumin: 4.2 g/dL (ref 3.6–5.1)
Alkaline phosphatase (APISO): 74 U/L (ref 37–153)
BUN: 15 mg/dL (ref 7–25)
CO2: 27 mmol/L (ref 20–32)
Calcium: 9.1 mg/dL (ref 8.6–10.4)
Chloride: 100 mmol/L (ref 98–110)
Creat: 0.98 mg/dL (ref 0.50–1.05)
Globulin: 3.7 g/dL (ref 1.9–3.7)
Glucose, Bld: 86 mg/dL (ref 65–99)
Potassium: 4.2 mmol/L (ref 3.5–5.3)
Sodium: 134 mmol/L — ABNORMAL LOW (ref 135–146)
Total Bilirubin: 0.8 mg/dL (ref 0.2–1.2)
Total Protein: 7.9 g/dL (ref 6.1–8.1)
eGFR: 63 mL/min/{1.73_m2} (ref >=60)

## 2023-03-23 NOTE — Progress Notes (Signed)
CBC and CMP are stable.  Lymphocyte count is low due to immunosuppression.

## 2023-04-26 ENCOUNTER — Other Ambulatory Visit: Payer: Self-pay | Admitting: Family Medicine

## 2023-04-26 ENCOUNTER — Other Ambulatory Visit: Payer: Self-pay | Admitting: Radiology

## 2023-04-26 DIAGNOSIS — E063 Autoimmune thyroiditis: Secondary | ICD-10-CM

## 2023-04-26 NOTE — Telephone Encounter (Signed)
 Copied from CRM [id number]. Topic: Clinical - Medication Refill >> Apr 26, 2023  1:24 PM Allyne Areola wrote: Most Recent Primary Care Visit:  Provider: Shelton Dibbles A  Department: [facility]  Visit Type: FLU SHOT  Date: 10/11/2022  Medication: SYNTHROID 75 MCG tablet   Has the patient contacted their pharmacy? Yes (Agent: If no, request that the patient contact the pharmacy for the refill. If patient does not wish to contact the pharmacy document the reason why and proceed with request.) (Agent: If yes, when and what did the pharmacy advise?)  Is this the correct pharmacy for this prescription? Yes If no, delete pharmacy and type the correct one.  This is the patient's preferred pharmacy:   [facility] - [facility] [phone] [address] [place] [address]      Has the prescription been filled recently? No  Is the patient out of the medication? No  Has the patient been seen for an appointment in the last year OR does the patient have an upcoming appointment? Yes  Can we respond through MyChart? Yes  Agent: Please be advised that Rx refills may take up to 3 business days. We ask that you follow-up with your pharmacy.

## 2023-04-26 NOTE — Telephone Encounter (Signed)
 Med refill request: Synthroid 75 mcg Last AEX: 09/08/21 Next AEX:  none scheduled Last MMG (if hormonal med) n/a Contacted patient regarding refill, patient states her PCP Dr. Valdene Garret Rx'd last refill.  RF denied Pharmacy notified Rx'd by PCP.

## 2023-04-27 NOTE — Telephone Encounter (Signed)
 Copied from CRM [id number]. Topic: Clinical - Medication Refill >> Apr 27, 2023 12:55 PM Shereese L wrote: Most Recent Primary Care Visit:  Provider: NYLE MACINTOSH A  Department: [facility]  Visit Type: FLU SHOT  Date: 10/11/2022  Medication: SYNTHROID  75 MCG tablet  Has the patient contacted their pharmacy? Yes (Agent: If no, request that the patient contact the pharmacy for the refill. If patient does not wish to contact the pharmacy document the reason why and proceed with request.) (Agent: If yes, when and what did the pharmacy advise?)  Is this the correct pharmacy for this prescription? Yes If no, delete pharmacy and type the correct one.  This is the patient's preferred pharmacy:  [facility] - [facility] [address] [place] [address] Phone: [phone] Fax: [phone]  [place] - [facility] [address] [place] [address] Phone: [phone] Fax: [phone]  [id number] [place], [place] - [address] AT [facility] [address] [facility] [address] [place] [address] Phone: [phone] Fax: [phone]   Has the prescription been filled recently? Yes  Is the patient out of the medication? Yes  Has the patient been seen for an appointment in the last year OR does the patient have an upcoming appointment? Yes  Can we respond through MyChart? Yes  Agent: Please be advised that Rx refills may take up to 3 business days. We ask that you follow-up with your pharmacy.

## 2023-04-30 ENCOUNTER — Other Ambulatory Visit: Payer: Self-pay

## 2023-04-30 ENCOUNTER — Telehealth: Payer: Self-pay | Admitting: *Deleted

## 2023-04-30 NOTE — Telephone Encounter (Signed)
 Patient need OV last visit on 03/23/2022  Last Rx synthroid  refill by Chrzanowski, Jami B, NP

## 2023-04-30 NOTE — Telephone Encounter (Signed)
 Copied from CRM [id number]. Topic: Clinical - Medication Refill >> Apr 26, 2023  1:24 PM Allyne Areola wrote: Most Recent Primary Care Visit:  Provider: Shelton Dibbles A  Department: [facility]  Visit Type: FLU SHOT  Date: 10/11/2022  Medication: SYNTHROID 75 MCG tablet   Has the patient contacted their pharmacy? Yes (Agent: If no, request that the patient contact the pharmacy for the refill. If patient does not wish to contact the pharmacy document the reason why and proceed with request.) (Agent: If yes, when and what did the pharmacy advise?)  Is this the correct pharmacy for this prescription? Yes If no, delete pharmacy and type the correct one.  This is the patient's preferred pharmacy:   [facility] - [facility] [phone] [address] [place] [address]      Has the prescription been filled recently? No  Is the patient out of the medication? No  Has the patient been seen for an appointment in the last year OR does the patient have an upcoming appointment? Yes  Can we respond through MyChart? Yes  Agent: Please be advised that Rx refills may take up to 3 business days. We ask that you follow-up with your pharmacy.

## 2023-05-01 NOTE — Telephone Encounter (Signed)
 Appt schedule With Family Medicine Valdene Garret, MD) 05/04/2023 at 8:20 AM

## 2023-05-04 ENCOUNTER — Ambulatory Visit (INDEPENDENT_AMBULATORY_CARE_PROVIDER_SITE_OTHER): Admitting: Family Medicine

## 2023-05-04 ENCOUNTER — Other Ambulatory Visit (HOSPITAL_BASED_OUTPATIENT_CLINIC_OR_DEPARTMENT_OTHER): Payer: Self-pay

## 2023-05-04 ENCOUNTER — Encounter: Payer: Self-pay | Admitting: Family Medicine

## 2023-05-04 ENCOUNTER — Other Ambulatory Visit: Payer: Self-pay

## 2023-05-04 VITALS — BP 106/68 | HR 61 | Temp 97.7°F | Ht 68.0 in | Wt 154.0 lb

## 2023-05-04 DIAGNOSIS — E063 Autoimmune thyroiditis: Secondary | ICD-10-CM

## 2023-05-04 DIAGNOSIS — I776 Arteritis, unspecified: Secondary | ICD-10-CM | POA: Diagnosis not present

## 2023-05-04 DIAGNOSIS — M3500 Sicca syndrome, unspecified: Secondary | ICD-10-CM

## 2023-05-04 LAB — TSH: TSH: 5.11 u[IU]/mL (ref 0.35–5.50)

## 2023-05-04 LAB — T4, FREE: Free T4: 0.75 ng/dL (ref 0.60–1.60)

## 2023-05-04 LAB — T3, FREE: T3, Free: 3 pg/mL (ref 2.3–4.2)

## 2023-05-04 MED ORDER — SYNTHROID 75 MCG PO TABS
75.0000 ug | ORAL_TABLET | Freq: Every day | ORAL | 3 refills | Status: AC
Start: 2023-05-04 — End: ?
  Filled 2023-05-04 – 2023-05-28 (×3): qty 90, 90d supply, fill #0
  Filled 2023-08-16: qty 90, 90d supply, fill #1
  Filled 2023-11-17: qty 90, 90d supply, fill #2

## 2023-05-04 MED ORDER — LEVOTHYROXINE SODIUM 75 MCG PO TABS
75.0000 ug | ORAL_TABLET | Freq: Every day | ORAL | 0 refills | Status: DC
  Filled 2023-05-04: qty 30, 30d supply, fill #0

## 2023-05-04 NOTE — Assessment & Plan Note (Signed)
 She has been on Synthroid  75 mcg daily for several years.  Previously prescribed by gynecology however they would like for us  to take over this prescription.  She is having more constipation and weight gain recently.  Will refill today however will check TSH today and adjust dose as needed.

## 2023-05-04 NOTE — Assessment & Plan Note (Signed)
 Following with rheumatology.  Symptoms stable on azathioprine .

## 2023-05-04 NOTE — Patient Instructions (Signed)
 It was very nice to see you today!  I will refill your Synthroid  today at 75 mcg daily.  Will check labs today.  We may need to make a dose adjustment depending on results of these labs.  Please come back soon for your annual physical.  Return for Annual Physical.   Take care, Dr Daneil Dunker  PLEASE NOTE:  If you had any lab tests, please let us  know if you have not heard back within a few days. You may see your results on mychart before we have a chance to review them but we will give you a call once they are reviewed by us .   If we ordered any referrals today, please let us  know if you have not heard from their office within the next week.   If you had any urgent prescriptions sent in today, please check with the pharmacy within an hour of our visit to make sure the prescription was transmitted appropriately.   Please try these tips to maintain a healthy lifestyle:  Eat at least 3 REAL meals and 1-2 snacks per day.  Aim for no more than 5 hours between eating.  If you eat breakfast, please do so within one hour of getting up.   Each meal should contain half fruits/vegetables, one quarter protein, and one quarter carbs (no bigger than a computer mouse)  Cut down on sweet beverages. This includes juice, soda, and sweet tea.   Drink at least 1 glass of water with each meal and aim for at least 8 glasses per day  Exercise at least 150 minutes every week.

## 2023-05-04 NOTE — Assessment & Plan Note (Signed)
 Follows with rheumatology.  Overall symptoms are stable on azathioprine .

## 2023-05-04 NOTE — Progress Notes (Signed)
   DESERAI CANSLER is a 68 y.o. female who presents today for an office visit.  Assessment/Plan:  Chronic Problems Addressed Today: Hashimoto's thyroiditis She has been on Synthroid  75 mcg daily for several years.  Previously prescribed by gynecology however they would like for us  to take over this prescription.  She is having more constipation and weight gain recently.  Will refill today however will check TSH today and adjust dose as needed.  Sjogren's disease (HCC) Follows with rheumatology.  Overall symptoms are stable on azathioprine .  Vasculitis Following with rheumatology.  Symptoms stable on azathioprine .    Subjective:  HPI:  See Assessment / plan for status of chronic conditions. Patient is here today for follow-up.  She needs refill on Synthroid .  She has been on this for years.  Typically prescribed by gynecology however they would like for us  to take over prescription.  Overall is doing well with this though recently has noticed more weight gain and more constipation issues.       Objective:  Physical Exam: BP 106/68   Pulse 61   Temp 97.7 F (36.5 C) (Temporal)   Ht 5\' 8"  (1.727 m)   Wt 154 lb (69.9 kg)   SpO2 99%   BMI 23.42 kg/m   Gen: No acute distress, resting comfortably CV: Regular rate and rhythm with no murmurs appreciated Pulm: Normal work of breathing, clear to auscultation bilaterally with no crackles, wheezes, or rhonchi Neuro: Grossly normal, moves all extremities Psych: Normal affect and thought content      Phyllicia Dudek M. Daneil Dunker, MD 05/04/2023 8:39 AM

## 2023-05-07 ENCOUNTER — Other Ambulatory Visit: Payer: Self-pay

## 2023-05-07 ENCOUNTER — Encounter: Payer: Self-pay | Admitting: Family Medicine

## 2023-05-07 ENCOUNTER — Other Ambulatory Visit (HOSPITAL_COMMUNITY): Payer: Self-pay

## 2023-05-07 ENCOUNTER — Other Ambulatory Visit: Payer: Self-pay | Admitting: Rheumatology

## 2023-05-07 MED ORDER — AZATHIOPRINE 50 MG PO TABS
50.0000 mg | ORAL_TABLET | Freq: Every day | ORAL | 0 refills | Status: DC
  Filled 2023-05-07: qty 90, 90d supply, fill #0

## 2023-05-07 NOTE — Telephone Encounter (Signed)
 Last Fill: 02/09/2023  Labs: 03/22/2023 CBC and CMP are stable.  Lymphocyte count is low due to immunosuppression.   Next Visit: 07/26/2023  Last Visit: 01/25/2023  DX: Sjogren's syndrome with other organ involvement   Current Dose per office note 01/25/2023:  Imuran  50 mg p.o. daily.   Okay to refill Imuran ?

## 2023-05-07 NOTE — Progress Notes (Signed)
 Labs are all at goal.  Do not need to make any adjustments to her treatment plan at this time.  She can continue with Synthroid  75 mcg daily.

## 2023-05-22 ENCOUNTER — Other Ambulatory Visit (HOSPITAL_COMMUNITY): Payer: Self-pay

## 2023-05-24 ENCOUNTER — Other Ambulatory Visit (HOSPITAL_BASED_OUTPATIENT_CLINIC_OR_DEPARTMENT_OTHER): Payer: Self-pay

## 2023-05-24 MED ORDER — LOTEMAX SM 0.38 % OP GEL
1.0000 [drp] | Freq: Every evening | OPHTHALMIC | 12 refills | Status: AC
  Filled 2023-05-24: qty 5, 30d supply, fill #0

## 2023-05-25 ENCOUNTER — Other Ambulatory Visit (HOSPITAL_BASED_OUTPATIENT_CLINIC_OR_DEPARTMENT_OTHER): Payer: Self-pay

## 2023-05-28 ENCOUNTER — Other Ambulatory Visit (HOSPITAL_COMMUNITY): Payer: Self-pay

## 2023-05-29 ENCOUNTER — Other Ambulatory Visit (HOSPITAL_COMMUNITY): Payer: Self-pay

## 2023-05-29 ENCOUNTER — Other Ambulatory Visit: Payer: Self-pay

## 2023-06-20 ENCOUNTER — Other Ambulatory Visit: Payer: Self-pay | Admitting: *Deleted

## 2023-06-20 DIAGNOSIS — Z79899 Other long term (current) drug therapy: Secondary | ICD-10-CM

## 2023-06-21 ENCOUNTER — Ambulatory Visit: Payer: Self-pay | Admitting: Rheumatology

## 2023-06-21 LAB — CBC WITH DIFFERENTIAL/PLATELET
Absolute Lymphocytes: 666 {cells}/uL — ABNORMAL LOW (ref 850–3900)
Absolute Monocytes: 354 {cells}/uL (ref 200–950)
Basophils Absolute: 20 {cells}/uL (ref 0–200)
Basophils Relative: 0.6 %
Eosinophils Absolute: 0 {cells}/uL — ABNORMAL LOW (ref 15–500)
Eosinophils Relative: 0 %
HCT: 40.2 % (ref 35.0–45.0)
Hemoglobin: 12.8 g/dL (ref 11.7–15.5)
MCH: 28.8 pg (ref 27.0–33.0)
MCHC: 31.8 g/dL — ABNORMAL LOW (ref 32.0–36.0)
MCV: 90.3 fL (ref 80.0–100.0)
MPV: 9.7 fL (ref 7.5–12.5)
Monocytes Relative: 10.4 %
Neutro Abs: 2360 {cells}/uL (ref 1500–7800)
Neutrophils Relative %: 69.4 %
Platelets: 237 10*3/uL (ref 140–400)
RBC: 4.45 10*6/uL (ref 3.80–5.10)
RDW: 13 % (ref 11.0–15.0)
Total Lymphocyte: 19.6 %
WBC: 3.4 10*3/uL — ABNORMAL LOW (ref 3.8–10.8)

## 2023-06-21 LAB — COMPREHENSIVE METABOLIC PANEL WITH GFR
AG Ratio: 1.2 (calc) (ref 1.0–2.5)
ALT: 7 U/L (ref 6–29)
AST: 34 U/L (ref 10–35)
Albumin: 4.2 g/dL (ref 3.6–5.1)
Alkaline phosphatase (APISO): 70 U/L (ref 37–153)
BUN: 17 mg/dL (ref 7–25)
CO2: 25 mmol/L (ref 20–32)
Calcium: 8.9 mg/dL (ref 8.6–10.4)
Chloride: 102 mmol/L (ref 98–110)
Creat: 0.96 mg/dL (ref 0.50–1.05)
Globulin: 3.4 g/dL (ref 1.9–3.7)
Glucose, Bld: 87 mg/dL (ref 65–99)
Potassium: 4.4 mmol/L (ref 3.5–5.3)
Sodium: 135 mmol/L (ref 135–146)
Total Bilirubin: 0.5 mg/dL (ref 0.2–1.2)
Total Protein: 7.6 g/dL (ref 6.1–8.1)
eGFR: 65 mL/min/{1.73_m2} (ref >=60)

## 2023-06-21 NOTE — Progress Notes (Signed)
 White cell count is low and stable.  Lymphocyte count is low due to immunosuppression.  CMP is normal.

## 2023-06-26 ENCOUNTER — Encounter: Payer: Self-pay | Admitting: Family Medicine

## 2023-06-26 ENCOUNTER — Ambulatory Visit (INDEPENDENT_AMBULATORY_CARE_PROVIDER_SITE_OTHER): Admitting: Family Medicine

## 2023-06-26 VITALS — BP 105/65 | HR 60 | Temp 97.7°F | Ht 68.0 in | Wt 148.8 lb

## 2023-06-26 DIAGNOSIS — Z0001 Encounter for general adult medical examination with abnormal findings: Secondary | ICD-10-CM | POA: Diagnosis not present

## 2023-06-26 DIAGNOSIS — I776 Arteritis, unspecified: Secondary | ICD-10-CM

## 2023-06-26 DIAGNOSIS — E063 Autoimmune thyroiditis: Secondary | ICD-10-CM | POA: Diagnosis not present

## 2023-06-26 DIAGNOSIS — M3500 Sicca syndrome, unspecified: Secondary | ICD-10-CM

## 2023-06-26 NOTE — Assessment & Plan Note (Signed)
 Follows with rheumatology.  Symptoms are stable on azathioprine .

## 2023-06-26 NOTE — Progress Notes (Signed)
 Chief Complaint:  Jennifer Barr is a 68 y.o. female who presents today for her annual comprehensive physical exam.    Assessment/Plan:  Chronic Problems Addressed Today: Sjogren's disease (HCC) Follows with rheumatology.  Symptoms are stable on azathioprine .  Vasculitis Stable on azathioprine  per rheumatology.  Hashimoto's thyroiditis We took over management of her Synthroid  at her last visit here.  Her TSH was at goal on Synthroid  75 mcg daily.  Recheck in 6 to 12 months.  Preventative Healthcare: Due for mammogram in August. UpToDate on vaccines and other screenings.   Patient Counseling(The following topics were reviewed and/or handout was given):  -Nutrition: Stressed importance of moderation in sodium/caffeine intake, saturated fat and cholesterol, caloric balance, sufficient intake of fresh fruits, vegetables, and fiber.  -Stressed the importance of regular exercise.   -Substance Abuse: Discussed cessation/primary prevention of tobacco, alcohol, or other drug use; driving or other dangerous activities under the influence; availability of treatment for abuse.   -Injury prevention: Discussed safety belts, safety helmets, smoke detector, smoking near bedding or upholstery.   -Sexuality: Discussed sexually transmitted diseases, partner selection, use of condoms, avoidance of unintended pregnancy and contraceptive alternatives.   -Dental health: Discussed importance of regular tooth brushing, flossing, and dental visits.  -Health maintenance and immunizations reviewed. Please refer to Health maintenance section.  Return to care in 1 year for next preventative visit.     Subjective:  HPI:  She has no acute complaints today. See Assessment / plan for status of chronic conditions.   Lifestyle Diet: Cutting down on sugars.  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 Walking routinely.      06/26/2023    8:32 AM  Depression screen PHQ 2/9  Decreased Interest 0  Down, Depressed,  Hopeless 0  PHQ - 2 Score 0    Health Maintenance Due  Topic Date Due   Medicare Annual Wellness (AWV)  03/16/2023     ROS: Per HPI, otherwise a complete review of systems was negative.   PMH:  The following were reviewed and entered/updated in epic: Past Medical History:  Diagnosis Date   HSV infection    Osteopenia 07/2017   T score -1.3 FRAX 7.2% / 0.6%   Premature ovarian failure    Sjoegren syndrome    Dr. Alvira Josephs   Thyroid  disease    Hashiomto   Vasculitis (HCC)    Dr. Alvira Josephs   Patient Active Problem List   Diagnosis Date Noted   Cold sore 03/23/2022   Peripheral neuropathy 12/14/2020   Acute parotitis 12/03/2017   ANA positive 02/10/2016   Rheumatoid factor positive 02/10/2016   Primary osteoarthritis, right hand 02/10/2016   Scoliosis 02/10/2016   Raynaud's syndrome without gangrene 02/10/2016   Sjogren's disease (HCC) 05/07/2014   Vasculitis (HCC) 05/07/2014   Hashimoto's thyroiditis 05/07/2014   Past Surgical History:  Procedure Laterality Date   TUBAL LIGATION      Family History  Problem Relation Age of Onset   Diabetes Mother    Diabetes Father    Scleroderma Daughter     Medications- reviewed and updated Current Outpatient Medications  Medication Sig Dispense Refill   acyclovir  ointment (ZOVIRAX ) 5 % Apply 1 Application topically every 3 (three) hours. 30 g 1   Ascorbic Acid (VITAMIN C PO) Take by mouth daily.     aspirin 81 MG tablet Take 81 mg by mouth daily.     azaTHIOprine  (IMURAN ) 50 MG tablet Take 1 tablet (50 mg) by mouth daily. 90 tablet 0  B Complex Vitamins (VITAMIN B COMPLEX PO) Take by mouth daily.     Calcium 200 MG TABS Take by mouth.     LOTEMAX  SM 0.38 % GEL Place 1 drop into both eyes every evening. 5 g 12   Magnesium 80 MG TABS Take by mouth.     SYNTHROID  75 MCG tablet Take 1 tablet (75 mcg total) by mouth daily before breakfast. 90 tablet 3   VITAMIN D  PO Take by mouth daily.     VITAMIN E PO Take by mouth.      No current facility-administered medications for this visit.    Allergies-reviewed and updated Allergies  Allergen Reactions   Sulfa Antibiotics Rash    Social History   Socioeconomic History   Marital status: Married    Spouse name: Not on file   Number of children: Not on file   Years of education: Not on file   Highest education level: Some college, no degree  Occupational History   Not on file  Tobacco Use   Smoking status: Never    Passive exposure: Never   Smokeless tobacco: Never  Vaping Use   Vaping status: Never Used  Substance and Sexual Activity   Alcohol use: Not Currently   Drug use: Never   Sexual activity: Yes    Partners: Male    Birth control/protection: Post-menopausal    Comment: 1st intercourse-18, partners- 1, no hx STI, 20 at first intercourse, no DES Exposure, no hx female cancer  Other Topics Concern   Not on file  Social History Narrative   Not on file   Social Drivers of Health   Financial Resource Strain: Low Risk  (06/25/2023)   Overall Financial Resource Strain (CARDIA)    Difficulty of Paying Living Expenses: Not hard at all  Food Insecurity: No Food Insecurity (06/25/2023)   Hunger Vital Sign    Worried About Running Out of Food in the Last Year: Never true    Ran Out of Food in the Last Year: Never true  Transportation Needs: No Transportation Needs (06/25/2023)   PRAPARE - Administrator, Civil Service (Medical): No    Lack of Transportation (Non-Medical): No  Physical Activity: Sufficiently Active (06/25/2023)   Exercise Vital Sign    Days of Exercise per Week: 4 days    Minutes of Exercise per Session: 50 min  Stress: No Stress Concern Present (06/25/2023)   [employer] of Occupational Health - Occupational Stress Questionnaire    Feeling of Stress: Not at all  Social Connections: Moderately Integrated (06/25/2023)   Social Connection and Isolation Panel    Frequency of Communication with Friends and Family:  More than three times a week    Frequency of Social Gatherings with Friends and Family: Three times a week    Attends Religious Services: More than 4 times per year    Active Member of Clubs or Organizations: No    Attends Engineer, structural: Not on file    Marital Status: Married        Objective:  Physical Exam: BP 105/65   Pulse 60   Temp 97.7 F (36.5 C) (Temporal)   Ht 5' 8 (1.727 m)   Wt 148 lb 12.8 oz (67.5 kg)   SpO2 97%   BMI 22.62 kg/m   Body mass index is 22.62 kg/m. Wt Readings from Last 3 Encounters:  06/26/23 148 lb 12.8 oz (67.5 kg)  05/04/23 154 lb (69.9 kg)  01/25/23 154 lb (  69.9 kg)   Gen: NAD, resting comfortably HEENT: TMs normal bilaterally. OP clear. No thyromegaly noted.  CV: RRR with no murmurs appreciated Pulm: NWOB, CTAB with no crackles, wheezes, or rhonchi GI: Normal bowel sounds present. Soft, Nontender, Nondistended. MSK: no edema, cyanosis, or clubbing noted Skin: warm, dry Neuro: CN2-12 grossly intact. Strength 5/5 in upper and lower extremities. Reflexes symmetric and intact bilaterally.  Psych: Normal affect and thought content     Josette Shimabukuro M. Daneil Dunker, MD 06/26/2023 9:02 AM

## 2023-06-26 NOTE — Assessment & Plan Note (Signed)
 Stable on azathioprine  per rheumatology.

## 2023-06-26 NOTE — Assessment & Plan Note (Signed)
 We took over management of her Synthroid  at her last visit here.  Her TSH was at goal on Synthroid  75 mcg daily.  Recheck in 6 to 12 months.

## 2023-06-26 NOTE — Patient Instructions (Signed)
 It was very nice to see you today!  You are doing a great job! Keep up the great work!  No changes today.  Please schedule your mammogram for August.   I will see you back next year for your annual physical.  Please come back to system if needed.  Return in about 1 year (around 06/25/2024) for Annual Physical.   Take care, Dr Daneil Dunker  PLEASE NOTE:  If you had any lab tests, please let us  know if you have not heard back within a few days. You may see your results on mychart before we have a chance to review them but we will give you a call once they are reviewed by us .   If we ordered any referrals today, please let us  know if you have not heard from their office within the next week.   If you had any urgent prescriptions sent in today, please check with the pharmacy within an hour of our visit to make sure the prescription was transmitted appropriately.   Please try these tips to maintain a healthy lifestyle:  Eat at least 3 REAL meals and 1-2 snacks per day.  Aim for no more than 5 hours between eating.  If you eat breakfast, please do so within one hour of getting up.   Each meal should contain half fruits/vegetables, one quarter protein, and one quarter carbs (no bigger than a computer mouse)  Cut down on sweet beverages. This includes juice, soda, and sweet tea.   Drink at least 1 glass of water with each meal and aim for at least 8 glasses per day  Exercise at least 150 minutes every week.    Preventive Care 70 Years and Older, Female Preventive care refers to lifestyle choices and visits with your health care provider that can promote health and wellness. Preventive care visits are also called wellness exams. What can I expect for my preventive care visit? Counseling Your health care provider may ask you questions about your: Medical history, including: Past medical problems. Family medical history. Pregnancy and menstrual history. History of falls. Current health,  including: Memory and ability to understand (cognition). Emotional well-being. Home life and relationship well-being. Sexual activity and sexual health. Lifestyle, including: Alcohol, nicotine or tobacco, and drug use. Access to firearms. Diet, exercise, and sleep habits. Work and work Astronomer. Sunscreen use. Safety issues such as seatbelt and bike helmet use. Physical exam Your health care provider will check your: Height and weight. These may be used to calculate your BMI (body mass index). BMI is a measurement that tells if you are at a healthy weight. Waist circumference. This measures the distance around your waistline. This measurement also tells if you are at a healthy weight and may help predict your risk of certain diseases, such as type 2 diabetes and high blood pressure. Heart rate and blood pressure. Body temperature. Skin for abnormal spots. What immunizations do I need?  Vaccines are usually given at various ages, according to a schedule. Your health care provider will recommend vaccines for you based on your age, medical history, and lifestyle or other factors, such as travel or where you work. What tests do I need? Screening Your health care provider may recommend screening tests for certain conditions. This may include: Lipid and cholesterol levels. Hepatitis C test. Hepatitis B test. HIV (human immunodeficiency virus) test. STI (sexually transmitted infection) testing, if you are at risk. Lung cancer screening. Colorectal cancer screening. Diabetes screening. This is done by checking your  blood sugar (glucose) after you have not eaten for a while (fasting). Mammogram. Talk with your health care provider about how often you should have regular mammograms. BRCA-related cancer screening. This may be done if you have a family history of breast, ovarian, tubal, or peritoneal cancers. Bone density scan. This is done to screen for osteoporosis. Talk with your health  care provider about your test results, treatment options, and if necessary, the need for more tests. Follow these instructions at home: Eating and drinking  Eat a diet that includes fresh fruits and vegetables, whole grains, lean protein, and low-fat dairy products. Limit your intake of foods with high amounts of sugar, saturated fats, and salt. Take vitamin and mineral supplements as recommended by your health care provider. Do not drink alcohol if your health care provider tells you not to drink. If you drink alcohol: Limit how much you have to 0-1 drink a day. Know how much alcohol is in your drink. In the [place] one drink equals one 12 oz bottle of beer (355 mL), one 5 oz glass of wine (148 mL), or one 1 oz glass of hard liquor (44 mL). Lifestyle Brush your teeth every morning and night with fluoride toothpaste. Floss one time each day. Exercise for at least 30 minutes 5 or more days each week. Do not use any products that contain nicotine or tobacco. These products include cigarettes, chewing tobacco, and vaping devices, such as e-cigarettes. If you need help quitting, ask your health care provider. Do not use drugs. If you are sexually active, practice safe sex. Use a condom or other form of protection in order to prevent STIs. Take aspirin only as told by your health care provider. Make sure that you understand how much to take and what form to take. Work with your health care provider to find out whether it is safe and beneficial for you to take aspirin daily. Ask your health care provider if you need to take a cholesterol-lowering medicine (statin). Find healthy ways to manage stress, such as: Meditation, yoga, or listening to music. Journaling. Talking to a trusted person. Spending time with friends and family. Minimize exposure to UV radiation to reduce your risk of skin cancer. Safety Always wear your seat belt while driving or riding in a vehicle. Do not drive: If you have  been drinking alcohol. Do not ride with someone who has been drinking. When you are tired or distracted. While texting. If you have been using any mind-altering substances or drugs. Wear a helmet and other protective equipment during sports activities. If you have firearms in your house, make sure you follow all gun safety procedures. What's next? Visit your health care provider once a year for an annual wellness visit. Ask your health care provider how often you should have your eyes and teeth checked. Stay up to date on all vaccines. This information is not intended to replace advice given to you by your health care provider. Make sure you discuss any questions you have with your health care provider. Document Revised: 06/23/2020 Document Reviewed: 06/23/2020 Elsevier Patient Education  2024 [employer].

## 2023-07-05 ENCOUNTER — Other Ambulatory Visit (HOSPITAL_BASED_OUTPATIENT_CLINIC_OR_DEPARTMENT_OTHER): Payer: Self-pay

## 2023-07-05 MED ORDER — SODIUM FLUORIDE 1.1 % DT PSTE
PASTE | DENTAL | 4 refills | Status: AC
Start: 2023-07-05 — End: ?
  Filled 2023-07-05: qty 100, 30d supply, fill #0

## 2023-07-12 NOTE — Progress Notes (Signed)
 Office Visit Note  Patient: Jennifer Barr             Date of Birth: 07-May-1955           MRN: [id number]             PCP: Kennyth Worth HERO, MD Referring: Kennyth Worth HERO, MD Visit Date: 07/26/2023 Occupation: @GUAROCC @  Subjective:  Medication management  History of Present Illness: Jennifer Barr is a 68 y.o. female with Sjogren's, vasculitis and osteoarthritis.  She returns today after last visit in January 2025.  Patient states she continues to have dry mouth and dry eyes.  She has not had any flares of vasculitis.  She states Raynaud's is not active currently.  She continues to have some joint pain and joint stiffness.    Activities of Daily Living:  Patient reports morning stiffness for  none.   Patient Denies nocturnal pain.  Difficulty dressing/grooming: Denies Difficulty climbing stairs: Denies Difficulty getting out of chair: Denies Difficulty using hands for taps, buttons, cutlery, and/or writing: Denies  Review of Systems  Constitutional:  Negative for fatigue.  HENT:  Positive for mouth dryness. Negative for mouth sores.   Eyes:  Positive for dryness.  Respiratory:  Negative for shortness of breath.   Cardiovascular:  Negative for chest pain and palpitations.  Gastrointestinal:  Positive for constipation. Negative for blood in stool and diarrhea.  Endocrine: Negative for increased urination.  Genitourinary:  Negative for involuntary urination.  Musculoskeletal:  Positive for myalgias and myalgias. Negative for joint pain, gait problem, joint pain, joint swelling, muscle weakness, morning stiffness and muscle tenderness.  Skin:  Negative for color change, rash, hair loss and sensitivity to sunlight.  Allergic/Immunologic: Negative for susceptible to infections.  Neurological:  Negative for dizziness and headaches.  Hematological:  Negative for swollen glands.  Psychiatric/Behavioral:  Negative for depressed mood and sleep disturbance. The patient is not  nervous/anxious.     PMFS History:  Patient Active Problem List   Diagnosis Date Noted   Cold sore 03/23/2022   Peripheral neuropathy 12/14/2020   Acute parotitis 12/03/2017   ANA positive 02/10/2016   Rheumatoid factor positive 02/10/2016   Primary osteoarthritis, right hand 02/10/2016   Scoliosis 02/10/2016   Raynaud's syndrome without gangrene 02/10/2016   Sjogren's disease (HCC) 05/07/2014   Vasculitis (HCC) 05/07/2014   Hashimoto's thyroiditis 05/07/2014    Past Medical History:  Diagnosis Date   HSV infection    Osteopenia 07/2017   T score -1.3 FRAX 7.2% / 0.6%   Premature ovarian failure    Sjoegren syndrome    Dr. Dolphus   Thyroid  disease    Hashiomto   Vasculitis (HCC)    Dr. Dolphus    Family History  Problem Relation Age of Onset   Diabetes Mother    Diabetes Father    Scleroderma Daughter    Past Surgical History:  Procedure Laterality Date   TUBAL LIGATION     Social History   Social History Narrative   Not on file   Immunization History  Administered Date(s) Administered   Fluad Quad(high Dose 65+) 10/19/2021   Fluad Trivalent(High Dose 65+) 10/11/2022   Influenza Inj Mdck Quad Pf 10/25/2020   Influenza, High Dose Seasonal PF 10/14/2018   Influenza,inj,Quad PF,6+ Mos 10/14/2018   Influenza-Unspecified 10/25/2020   Moderna Sars-Covid-2 Vaccination 03/25/2019, 04/22/2019, 09/01/2019   PNEUMOCOCCAL CONJUGATE-20 10/19/2021   Pfizer(Comirnaty )Fall Seasonal Vaccine 12 years and older 10/11/2022   Tdap 06/06/2018   Zoster Recombinant(Shingrix)  10/25/2020, 03/01/2021     Objective: Vital Signs: BP 123/75 (BP Location: Left Arm, Patient Position: Sitting, Cuff Size: Normal)   Pulse 63   Resp 16   Ht 5' 9 (1.753 m)   Wt 146 lb 9.6 oz (66.5 kg)   BMI 21.65 kg/m    Physical Exam Vitals and nursing note reviewed.  Constitutional:      Appearance: She is well-developed.  HENT:     Head: Normocephalic and atraumatic.  Eyes:      Conjunctiva/sclera: Conjunctivae normal.  Cardiovascular:     Rate and Rhythm: Normal rate and regular rhythm.     Heart sounds: Normal heart sounds.  Pulmonary:     Effort: Pulmonary effort is normal.     Breath sounds: Normal breath sounds.  Abdominal:     General: Bowel sounds are normal.     Palpations: Abdomen is soft.  Musculoskeletal:     Cervical back: Normal range of motion.  Lymphadenopathy:     Cervical: No cervical adenopathy.  Skin:    General: Skin is warm and dry.     Capillary Refill: Capillary refill takes less than 2 seconds.  Neurological:     Mental Status: She is alert and oriented to person, place, and time.  Psychiatric:        Behavior: Behavior normal.      Musculoskeletal Exam: Cervical, thoracic or lumbar spine were in good range of motion.  Shoulders, elbows, wrists, MCPs PIPs and DIPs were in good range of motion without any synovitis.  Hip joints and knee joints in good range of motion without any warmth swelling or effusion.  There was no tenderness over ankles or MTPs.  CDAI Exam: CDAI Score: -- Patient Global: --; Provider Global: -- Swollen: --; Tender: -- Joint Exam 07/26/2023   No joint exam has been documented for this visit   There is currently no information documented on the homunculus. Go to the Rheumatology activity and complete the homunculus joint exam.  Investigation: No additional findings.  Imaging: No results found.  Recent Labs: Lab Results  Component Value Date   WBC 3.4 (L) 06/20/2023   HGB 12.8 06/20/2023   PLT 237 06/20/2023   NA 135 06/20/2023   K 4.4 06/20/2023   CL 102 06/20/2023   CO2 25 06/20/2023   GLUCOSE 87 06/20/2023   BUN 17 06/20/2023   CREATININE 0.96 06/20/2023   BILITOT 0.5 06/20/2023   ALKPHOS 81 09/01/2019   AST 34 06/20/2023   ALT 7 06/20/2023   PROT 7.6 06/20/2023   ALBUMIN 4.3 09/01/2019   CALCIUM 8.9 06/20/2023   GFRAA 79 06/17/2020    Speciality Comments: PLQ Eye Exam: 04/22/2019  WNL @ [facility], OD PA  Procedures:  No procedures performed Allergies: Sulfa antibiotics   Assessment / Plan:     Visit Diagnoses: Sjogren's syndrome with other organ involvement (HCC) - Positive ANA, positive SSA, positive SSB, positive RF (later negative), severe sicca symptoms: No benefit on pilocarpine .  She continues to have dry mouth and dry eye symptoms.  Over-the-counter products were discussed.  She denies any shortness of breath or lymphadenopathy.  Lungs were clear to auscultation.  She has not had any recent episodes of rash.  No parotid swelling or lymphadenopathy was noted on the examination.  I advised her to contact me if she develops any new symptoms.  Will continue current regimen.  High risk medication use - Imuran  50 mg 1 tablet p.o. daily (Plaquenil  discontinued due to  lymphopenia).  Labs obtained on June 20, 2023 WBC 3.4, CMP normal.  ANA and double-stranded DNA were negative  on September 21, 2022.  She was advised to get labs every 3 months.  Information reimmunization was placed in the AVS.  Lymphopenia - Followed by Dr. Onesimo  Vasculitis (HCC) - Pan ANCA negative.  She has had no recent episodes of vasculitis.  Raynaud's syndrome without gangrene-she has not more issues with Raynaud's during the winter months.  She is doing well.  Primary osteoarthritis of both hands-bilateral PIP and DIP thickening was noted.  No synovitis was noted.  Joint protection was discussed.  Scoliosis  Osteopenia of multiple sites - September 28, 2021 T-score -1.5 AP spine.  DEXA followed by GYN patient wants to hold off DEXA scan until next year.  History of hypothyroidism  Orders: No orders of the defined types were placed in this encounter.  No orders of the defined types were placed in this encounter.    Follow-Up Instructions: Return in about 5 months (around 12/26/2023) for Sjogren's.   Maya Nash, MD  Note - This record has been created using Animal nutritionist.   Chart creation errors have been sought, but may not always  have been located. Such creation errors do not reflect on  the standard of medical care.

## 2023-07-26 ENCOUNTER — Ambulatory Visit: Payer: PPO | Attending: Rheumatology | Admitting: Rheumatology

## 2023-07-26 ENCOUNTER — Encounter: Payer: Self-pay | Admitting: Rheumatology

## 2023-07-26 VITALS — BP 123/75 | HR 63 | Resp 16 | Ht 69.0 in | Wt 146.6 lb

## 2023-07-26 DIAGNOSIS — M8589 Other specified disorders of bone density and structure, multiple sites: Secondary | ICD-10-CM | POA: Diagnosis not present

## 2023-07-26 DIAGNOSIS — I776 Arteritis, unspecified: Secondary | ICD-10-CM

## 2023-07-26 DIAGNOSIS — I7300 Raynaud's syndrome without gangrene: Secondary | ICD-10-CM

## 2023-07-26 DIAGNOSIS — Z8639 Personal history of other endocrine, nutritional and metabolic disease: Secondary | ICD-10-CM

## 2023-07-26 DIAGNOSIS — M3509 Sicca syndrome with other organ involvement: Secondary | ICD-10-CM

## 2023-07-26 DIAGNOSIS — D72810 Lymphocytopenia: Secondary | ICD-10-CM | POA: Diagnosis not present

## 2023-07-26 DIAGNOSIS — Z79899 Other long term (current) drug therapy: Secondary | ICD-10-CM

## 2023-07-26 DIAGNOSIS — M419 Scoliosis, unspecified: Secondary | ICD-10-CM

## 2023-07-26 DIAGNOSIS — M19042 Primary osteoarthritis, left hand: Secondary | ICD-10-CM | POA: Diagnosis not present

## 2023-07-26 DIAGNOSIS — M19041 Primary osteoarthritis, right hand: Secondary | ICD-10-CM | POA: Diagnosis not present

## 2023-07-26 NOTE — Patient Instructions (Signed)
 Standing Labs We placed an order today for your standing lab work.   Please have your standing labs drawn in September and every 3 months  Please have your labs drawn 2 weeks prior to your appointment so that the provider can discuss your lab results at your appointment, if possible.  Please note that you may see your imaging and lab results in MyChart before we have reviewed them. We will contact you once all results are reviewed. Please allow our office up to 72 hours to thoroughly review all of the results before contacting the office for clarification of your results.  WALK-IN LAB HOURS  Monday through Thursday from 8:00 am -12:30 pm and 1:00 pm-4:30 pm and Friday from 8:00 am-12:00 pm.  Patients with office visits requiring labs will be seen before walk-in labs.  You may encounter longer than normal wait times. Please allow additional time. Wait times may be shorter on  Monday and Thursday afternoons.  We do not book appointments for walk-in labs. We appreciate your patience and understanding with our staff.   Labs are drawn by [facility]. Please bring your co-pay at the time of your lab draw.  You may receive a bill from [facility] for your lab work.  Please note if you are on Hydroxychloroquine  and and an order has been placed for a Hydroxychloroquine  level,  you will need to have it drawn 4 hours or more after your last dose.  If you wish to have your labs drawn at another location, please call the office 24 hours in advance so we can fax the orders.  The office is located at [address], [address], [place], [place] [address]   If you have any questions regarding directions or hours of operation,  please call [phone].   As a reminder, please drink plenty of water prior to coming for your lab work. Thanks!   Vaccines You are taking a medication(s) that can suppress your immune system.  The following immunizations are recommended: Flu annually Covid-19  Td/Tdap (tetanus,  diphtheria, pertussis) every 10 years Pneumonia (Prevnar 15 then Pneumovax 23 at least 1 year apart.  Alternatively, can take Prevnar 20 without needing additional dose) Shingrix: 2 doses from 4 weeks to 6 months apart  Please check with your PCP to make sure you are up to date.

## 2023-08-16 ENCOUNTER — Other Ambulatory Visit: Payer: Self-pay | Admitting: Physician Assistant

## 2023-08-16 ENCOUNTER — Other Ambulatory Visit (HOSPITAL_BASED_OUTPATIENT_CLINIC_OR_DEPARTMENT_OTHER): Payer: Self-pay

## 2023-08-16 ENCOUNTER — Other Ambulatory Visit: Payer: Self-pay

## 2023-08-16 ENCOUNTER — Other Ambulatory Visit (HOSPITAL_COMMUNITY): Payer: Self-pay

## 2023-08-16 MED ORDER — AZATHIOPRINE 50 MG PO TABS
50.0000 mg | ORAL_TABLET | Freq: Every day | ORAL | 0 refills | Status: DC
  Filled 2023-08-16: qty 90, 90d supply, fill #0

## 2023-08-16 NOTE — Telephone Encounter (Signed)
 Last Fill: 05/07/2023  Labs: 06/20/2023 White cell count is low and stable. Lymphocyte count is low due to immunosuppression. CMP is normal.   Next Visit: 01/31/2024  Last Visit: 07/26/2023  DX: Sjogren's syndrome with other organ involvement (HCC)   Current Dose per office note 07/26/2023: Imuran  50 mg 1 tablet p.o. daily   Okay to refill Imuran ?

## 2023-08-28 ENCOUNTER — Other Ambulatory Visit: Payer: Self-pay | Admitting: Family Medicine

## 2023-08-28 DIAGNOSIS — Z1231 Encounter for screening mammogram for malignant neoplasm of breast: Secondary | ICD-10-CM

## 2023-09-25 ENCOUNTER — Ambulatory Visit

## 2023-09-27 ENCOUNTER — Ambulatory Visit
Admission: RE | Admit: 2023-09-27 | Discharge: 2023-09-27 | Disposition: A | Discharge status: Home / Self Care | Source: Ambulatory Visit | Attending: Family Medicine | Admitting: Family Medicine

## 2023-09-27 DIAGNOSIS — Z1231 Encounter for screening mammogram for malignant neoplasm of breast: Secondary | ICD-10-CM

## 2023-10-02 ENCOUNTER — Encounter

## 2023-10-02 ENCOUNTER — Other Ambulatory Visit (HOSPITAL_BASED_OUTPATIENT_CLINIC_OR_DEPARTMENT_OTHER): Payer: Self-pay

## 2023-10-02 ENCOUNTER — Telehealth: Admitting: Physician Assistant

## 2023-10-02 DIAGNOSIS — R3989 Other symptoms and signs involving the genitourinary system: Secondary | ICD-10-CM | POA: Diagnosis not present

## 2023-10-02 MED ORDER — CEPHALEXIN 500 MG PO CAPS
500.0000 mg | ORAL_CAPSULE | Freq: Two times a day (BID) | ORAL | 0 refills | Status: AC
  Filled 2023-10-02: qty 14, 7d supply, fill #0

## 2023-10-02 NOTE — Patient Instructions (Signed)
 Jennifer Barr, thank you for joining Jennifer Velma Lunger, PA-C for today's virtual visit.  While this provider is not your primary care provider (PCP), if your PCP is located in our provider database this encounter information will be shared with them immediately following your visit.   Jennifer Barr MyChart account gives you access to today's visit and all your visits, tests, and labs performed at [facility]  click here if you don't have Jennifer [place] MyChart account or go to [email or web address]  Consent: (Patient) Jennifer Barr provided verbal consent for this virtual visit at the beginning of the encounter.  Current Medications:  Current Outpatient Medications:    acyclovir  ointment (ZOVIRAX ) 5 %, Apply 1 Application topically every 3 (three) hours. (Patient taking differently: Apply 1 Application topically as needed.), Disp: 30 g, Rfl: 1   Ascorbic Acid (VITAMIN C PO), Take by mouth daily., Disp: , Rfl:    aspirin 81 MG tablet, Take 81 mg by mouth daily., Disp: , Rfl:    azaTHIOprine  (IMURAN ) 50 MG tablet, Take 1 tablet (50 mg) by mouth daily., Disp: 90 tablet, Rfl: 0   B Complex Vitamins (VITAMIN B COMPLEX PO), Take by mouth daily., Disp: , Rfl:    Calcium 200 MG TABS, Take by mouth., Disp: , Rfl:    LOTEMAX  SM 0.38 % GEL, Place 1 drop into both eyes every evening. (Patient not taking: Reported on 07/26/2023), Disp: 5 g, Rfl: 12   Magnesium 80 MG TABS, Take by mouth., Disp: , Rfl:    Sodium Fluoride  1.1 % PSTE, Brush teeth once daily for 2 minutes, preferably at bedtime., Disp: 100 mL, Rfl: 4   SYNTHROID  75 MCG tablet, Take 1 tablet (75 mcg total) by mouth daily before breakfast., Disp: 90 tablet, Rfl: 3   VITAMIN D  PO, Take by mouth daily., Disp: , Rfl:    VITAMIN E PO, Take by mouth., Disp: , Rfl:    Medications ordered in this encounter:  No orders of the defined types were placed in this encounter.    *If you need refills on other medications prior to your  next appointment, please contact your pharmacy*  Follow-Up: Call back or seek an in-person evaluation if the symptoms worsen or if the condition fails to improve as anticipated.  [facility] [phone]  Other Instructions Your symptoms are consistent with Jennifer bladder infection, also called acute cystitis. Please take your antibiotic (Keflex ) as directed until all pills are gone.  Stay very well hydrated.  Consider Jennifer daily probiotic (Align, Culturelle, or Activia) to help prevent stomach upset caused by the antibiotic.  Taking Jennifer probiotic daily may also help prevent recurrent UTIs.  Also consider taking AZO (Phenazopyridine) tablets to help decrease pain with urination.    Urinary Tract Infection Jennifer urinary tract infection (UTI) can occur any place along the urinary tract. The tract includes the kidneys, ureters, bladder, and urethra. Jennifer type of germ called bacteria often causes Jennifer UTI. UTIs are often helped with antibiotic medicine.  HOME CARE  If given, take antibiotics as told by your doctor. Finish them even if you start to feel better. Drink enough fluids to keep your pee (urine) clear or pale yellow. Avoid tea, drinks with caffeine, and bubbly (carbonated) drinks. Pee often. Avoid holding your pee in for Jennifer long time. Pee before and after having sex (intercourse). Wipe from front to back after you poop (bowel movement) if you are Jennifer woman. Use each tissue only once.  GET HELP RIGHT AWAY IF:  You have back pain. You have lower belly (abdominal) pain. You have chills. You feel sick to your stomach (nauseous). You throw up (vomit). Your burning or discomfort with peeing does not go away. You have Jennifer fever. Your symptoms are not better in 3 days. MAKE SURE YOU:  Understand these instructions. Will watch your condition. Will get help right away if you are not doing well or get worse. Document Released: 06/14/2007 Document Revised: 09/20/2011 Document Reviewed:  07/27/2011 [facility] Patient Information 2015 [place], [place]. This information is not intended to replace advice given to you by your health care provider. Make sure you discuss any questions you have with your health care provider.    If you have been instructed to have an in-person evaluation today at Jennifer local Urgent Care facility, please use the link below. It will take you to Jennifer list of all of our available [facility], including address, phone number and hours of operation. Please do not delay care.  [facility]  If you or Jennifer family member do not have Jennifer primary care provider, use the link below to schedule Jennifer visit and establish care. When you choose Jennifer [facility] primary care physician or advanced practice provider, you gain Jennifer long-term partner in health. Find Jennifer Primary Care Provider  Learn more about [facility] in-office and virtual care options:  - Get Care Now

## 2023-10-02 NOTE — Progress Notes (Signed)
 Virtual Visit Consent   Jennifer Barr, you are scheduled for a virtual visit with a [facility] provider today. Just as with appointments in the office, your consent must be obtained to participate. Your consent will be active for this visit and any virtual visit you may have with one of our providers in the next 365 days. If you have a MyChart account, a copy of this consent can be sent to you electronically.  As this is a virtual visit, video technology does not allow for your provider to perform a traditional examination. This may limit your provider's ability to fully assess your condition. If your provider identifies any concerns that need to be evaluated in person or the need to arrange testing (such as labs, EKG, etc.), we will make arrangements to do so. Although advances in technology are sophisticated, we cannot ensure that it will always work on either your end or our end. If the connection with a video visit is poor, the visit may have to be switched to a telephone visit. With either a video or telephone visit, we are not always able to ensure that we have a secure connection.  By engaging in this virtual visit, you consent to the provision of healthcare and authorize for your insurance to be billed (if applicable) for the services provided during this visit. Depending on your insurance coverage, you may receive a charge related to this service.  I need to obtain your verbal consent now. Are you willing to proceed with your visit today? [name] has provided verbal consent on 10/02/2023 for a virtual visit (video or telephone). Jennifer Barr, [place]  Date: 10/02/2023 10:55 AM   Virtual Visit via Video Note   I, Jennifer Barr, connected with  Jennifer Barr  ([id number], 11/24/1955) on 10/02/23 at 11:00 AM EDT by a video-enabled telemedicine application and verified that I am speaking with the correct person using two identifiers.  Location: Patient: Virtual Visit Location  Patient: Home Provider: Virtual Visit Location Provider: Home Office   I discussed the limitations of evaluation and management by telemedicine and the availability of in person appointments. The patient expressed understanding and agreed to proceed.    History of Present Illness: Jennifer Barr is a 68 y.o. who identifies as a female who was assigned female at birth, and is being seen today for possible UTI. Patient endorses symptoms starting last night with urinary urgency/frequency and hesitancy. Notes pink-tinged urine this morning. Denies fever, nausea, vomiting, back or belly pain. Notes mild chills.   Has started AZO over-the-counter with some mild relief. Does have history of UTI, but thankfully not a frequent issue for her.   HPI: HPI  Problems:  Patient Active Problem List   Diagnosis Date Noted   Cold sore 03/23/2022   Peripheral neuropathy 12/14/2020   Acute parotitis 12/03/2017   ANA positive 02/10/2016   Rheumatoid factor positive 02/10/2016   Primary osteoarthritis, right hand 02/10/2016   Scoliosis 02/10/2016   Raynaud's syndrome without gangrene 02/10/2016   Sjogren's disease 05/07/2014   Vasculitis 05/07/2014   Hashimoto's thyroiditis 05/07/2014    Allergies:  Allergies  Allergen Reactions   Sulfa Antibiotics Rash   Medications:  Current Outpatient Medications:    cephALEXin  (KEFLEX ) 500 MG capsule, Take 1 capsule (500 mg total) by mouth 2 (two) times daily for 7 days., Disp: 14 capsule, Rfl: 0   acyclovir  ointment (ZOVIRAX ) 5 %, Apply 1 Application topically every 3 (three) hours. (Patient taking  differently: Apply 1 Application topically as needed.), Disp: 30 g, Rfl: 1   Ascorbic Acid (VITAMIN C PO), Take by mouth daily., Disp: , Rfl:    aspirin 81 MG tablet, Take 81 mg by mouth daily., Disp: , Rfl:    azaTHIOprine  (IMURAN ) 50 MG tablet, Take 1 tablet (50 mg) by mouth daily., Disp: 90 tablet, Rfl: 0   B Complex Vitamins (VITAMIN B COMPLEX PO), Take by mouth  daily., Disp: , Rfl:    Calcium 200 MG TABS, Take by mouth., Disp: , Rfl:    LOTEMAX  SM 0.38 % GEL, Place 1 drop into both eyes every evening. (Patient not taking: Reported on 07/26/2023), Disp: 5 g, Rfl: 12   Magnesium 80 MG TABS, Take by mouth., Disp: , Rfl:    Sodium Fluoride  1.1 % PSTE, Brush teeth once daily for 2 minutes, preferably at bedtime., Disp: 100 mL, Rfl: 4   SYNTHROID  75 MCG tablet, Take 1 tablet (75 mcg total) by mouth daily before breakfast., Disp: 90 tablet, Rfl: 3   VITAMIN D  PO, Take by mouth daily., Disp: , Rfl:    VITAMIN E PO, Take by mouth., Disp: , Rfl:   Observations/Objective: Patient is well-developed, well-nourished in no acute distress.  Resting comfortably at home.  Head is normocephalic, atraumatic.  No labored breathing.  Speech is clear and coherent with logical content.  Patient is alert and oriented at baseline.   Assessment and Plan: 1. Suspected UTI (Primary) - cephALEXin  (KEFLEX ) 500 MG capsule; Take 1 capsule (500 mg total) by mouth 2 (two) times daily for 7 days.  Dispense: 14 capsule; Refill: 0  Classic UTI symptoms with absence of alarm signs or symptoms. Prior history of UTI. Will treat empirically with Keflex  for suspected uncomplicated cystitis. Supportive measures and OTC medications reviewed. Strict in-person evaluation precautions discussed.    Follow Up Instructions: I discussed the assessment and treatment plan with the patient. The patient was provided an opportunity to ask questions and all were answered. The patient agreed with the plan and demonstrated an understanding of the instructions.  A copy of instructions were sent to the patient via MyChart unless otherwise noted below.   The patient was advised to call back or seek an in-person evaluation if the symptoms worsen or if the condition fails to improve as anticipated.    Jennifer Velma Lunger, PA-C

## 2023-10-16 ENCOUNTER — Other Ambulatory Visit: Payer: Self-pay | Admitting: *Deleted

## 2023-10-16 DIAGNOSIS — Z79899 Other long term (current) drug therapy: Secondary | ICD-10-CM | POA: Diagnosis not present

## 2023-10-16 LAB — COMPREHENSIVE METABOLIC PANEL WITH GFR
AG Ratio: 1.2 (calc) (ref 1.0–2.5)
ALT: 7 U/L (ref 6–29)
AST: 34 U/L (ref 10–35)
Albumin: 4.2 g/dL (ref 3.6–5.1)
Alkaline phosphatase (APISO): 70 U/L (ref 37–153)
BUN: 14 mg/dL (ref 7–25)
CO2: 25 mmol/L (ref 20–32)
Calcium: 9 mg/dL (ref 8.6–10.4)
Chloride: 103 mmol/L (ref 98–110)
Creat: 0.89 mg/dL (ref 0.50–1.05)
Globulin: 3.4 g/dL (ref 1.9–3.7)
Glucose, Bld: 92 mg/dL (ref 65–99)
Potassium: 4.6 mmol/L (ref 3.5–5.3)
Sodium: 136 mmol/L (ref 135–146)
Total Bilirubin: 0.5 mg/dL (ref 0.2–1.2)
Total Protein: 7.6 g/dL (ref 6.1–8.1)
eGFR: 71 mL/min/1.73m2 (ref >=60)

## 2023-10-16 LAB — CBC WITH DIFFERENTIAL/PLATELET
Absolute Lymphocytes: 475 {cells}/uL — ABNORMAL LOW (ref 850–3900)
Absolute Monocytes: 356 {cells}/uL (ref 200–950)
Basophils Absolute: 20 {cells}/uL (ref 0–200)
Basophils Relative: 0.6 %
Eosinophils Absolute: 10 {cells}/uL — ABNORMAL LOW (ref 15–500)
Eosinophils Relative: 0.3 %
HCT: 38.4 % (ref 35.0–45.0)
Hemoglobin: 12.4 g/dL (ref 11.7–15.5)
MCH: 29.2 pg (ref 27.0–33.0)
MCHC: 32.3 g/dL (ref 32.0–36.0)
MCV: 90.4 fL (ref 80.0–100.0)
MPV: 9.5 fL (ref 7.5–12.5)
Monocytes Relative: 10.8 %
Neutro Abs: 2439 {cells}/uL (ref 1500–7800)
Neutrophils Relative %: 73.9 %
Platelets: 239 Thousand/uL (ref 140–400)
RBC: 4.25 Million/uL (ref 3.80–5.10)
RDW: 12.9 % (ref 11.0–15.0)
Total Lymphocyte: 14.4 %
WBC: 3.3 Thousand/uL — ABNORMAL LOW (ref 3.8–10.8)

## 2023-10-17 ENCOUNTER — Ambulatory Visit: Payer: Self-pay | Admitting: Rheumatology

## 2023-10-17 NOTE — Progress Notes (Signed)
 White cell count is low and stable.  Lymphocyte count is low due to immunosuppression.  CMP is normal.

## 2023-11-15 ENCOUNTER — Encounter: Admitting: Family Medicine

## 2023-11-17 ENCOUNTER — Other Ambulatory Visit: Payer: Self-pay | Admitting: Rheumatology

## 2023-11-19 ENCOUNTER — Other Ambulatory Visit (HOSPITAL_COMMUNITY): Payer: Self-pay

## 2023-11-19 ENCOUNTER — Other Ambulatory Visit: Payer: Self-pay

## 2023-11-19 MED ORDER — AZATHIOPRINE 50 MG PO TABS
50.0000 mg | ORAL_TABLET | Freq: Every day | ORAL | 0 refills | Status: AC
Start: 2023-11-19 — End: ?
  Filled 2023-11-19: qty 90, 90d supply, fill #0

## 2023-11-19 NOTE — Telephone Encounter (Signed)
 Last Fill: 08/16/2023  Labs: 10/16/2023 White cell count is low and stable. Lymphocyte count is low due to immunosuppression. CMP is normal.   Next Visit: 01/31/2024  Last Visit: 07/26/2023  DX: Sjogren's syndrome with other organ involvement   Current Dose per office note on 07/26/2023: Imuran  50 mg 1 tablet p.o. daily   Okay to refill Imuran ?

## 2023-11-20 ENCOUNTER — Other Ambulatory Visit: Payer: Self-pay

## 2023-11-21 ENCOUNTER — Ambulatory Visit (INDEPENDENT_AMBULATORY_CARE_PROVIDER_SITE_OTHER): Admitting: Radiology

## 2023-11-21 ENCOUNTER — Encounter: Payer: Self-pay | Admitting: Radiology

## 2023-11-21 ENCOUNTER — Other Ambulatory Visit (HOSPITAL_COMMUNITY)
Admission: RE | Admit: 2023-11-21 | Discharge: 2023-11-21 | Disposition: A | Discharge status: Home / Self Care | Source: Ambulatory Visit | Attending: Radiology | Admitting: Radiology

## 2023-11-21 VITALS — BP 112/60 | HR 66 | Ht 67.75 in | Wt 150.0 lb

## 2023-11-21 DIAGNOSIS — Z9189 Other specified personal risk factors, not elsewhere classified: Secondary | ICD-10-CM | POA: Diagnosis not present

## 2023-11-21 DIAGNOSIS — B009 Herpesviral infection, unspecified: Secondary | ICD-10-CM

## 2023-11-21 DIAGNOSIS — M8580 Other specified disorders of bone density and structure, unspecified site: Secondary | ICD-10-CM | POA: Diagnosis not present

## 2023-11-21 DIAGNOSIS — Z01419 Encounter for gynecological examination (general) (routine) without abnormal findings: Secondary | ICD-10-CM

## 2023-11-21 DIAGNOSIS — N951 Menopausal and female climacteric states: Secondary | ICD-10-CM

## 2023-11-21 DIAGNOSIS — Z124 Encounter for screening for malignant neoplasm of cervix: Secondary | ICD-10-CM

## 2023-11-21 NOTE — Progress Notes (Signed)
   Jennifer Barr 11-Jul-1955 [id number]   History: Postmenopausal 68 y.o. presents for breast and pelvic exam. Low risk. Thyroid  managed by PCP. Some vaginal dryness using [place]. Osteopenia 2023. Does burn bootcamp 4 days a week.    Risk Factors for Medicare Patients >/= 5 sexual partners in a lifetime: No  First intercourse <51 years of age: No  H/O STD at any age: No  Abnormal pap smear, < 3 negative paps within the last 7 years: No  DES exposure (women born between [phone]): No  Patient is on post breast cancer medication like Femara or, if medication like this is not needed, 5 years post breast cancer diagnosis: No   Gynecologic History Postmenopausal Last Pap: 2022. Results were: normal Last mammogram: 09/27/23. Results were: normal Last colonoscopy: 2019 DEXA:2023 osteopenia   Obstetric History OB History  Gravida Para Term Preterm AB Living  4 3 3  0 1 3  SAB IAB Ectopic Multiple Live Births  1        # Outcome Date GA Lbr Len/2nd Weight Sex Type Anes PTL Lv  4 SAB           3 Term           2 Term           1 Term              The following portions of the patient's history were reviewed and updated as appropriate: allergies, current medications, past family history, past medical history, past social history, past surgical history, and problem list.  Review of Systems Pertinent items noted in HPI and remainder of comprehensive ROS otherwise negative.  Past medical history, past surgical history, family history and social history were all reviewed and documented in the EPIC chart.  Exam:  Vitals:   11/21/23 1034  BP: 112/60  Pulse: 66  SpO2: 99%  Weight: 150 lb (68 kg)  Height: 5' 7.75 (1.721 m)   Body mass index is 22.98 kg/m.  General appearance:  Normal Thyroid :  Symmetrical, normal in size, without palpable masses or nodularity. Respiratory  Auscultation:  Clear without wheezing or rhonchi Cardiovascular  Auscultation:  Regular rate, without rubs,  murmurs or gallops  Edema/varicosities:  Not grossly evident Abdominal  Soft,nontender, without masses, guarding or rebound.  Liver/spleen:  No organomegaly noted  Hernia:  None appreciated  Skin  Inspection:  Grossly normal Breasts: Examined lying and sitting.   Right: Without masses, retractions, nipple discharge or axillary adenopathy.   Left: Without masses, retractions, nipple discharge or axillary adenopathy. Genitourinary   Inguinal/mons:  Normal without inguinal adenopathy  External genitalia:  Normal appearing vulva with no masses, tenderness, or lesions  BUS/Urethra/Skene's glands:  Normal  Vagina:  Normal appearing with normal color and discharge, no lesions.   Cervix:  Normal appearing without discharge or lesions  Uterus:  Normal in size, shape and contour.  Midline and mobile, nontender  Adnexa/parametria:     Rt: Normal in size, without masses or tenderness.   Lt: Normal in size, without masses or tenderness.  Anus and perineum: Normal    Darice Hoit, CMA present for exam  Assessment/Plan:   1. Encounter for breast and pelvic examination (Primary) Pap today Mammo yearly Labs with PCP Gisele lube for dryness  2. Osteopenia after menopause Will order DEXA at [facility]    Return in 2 years for B&P or sooner prn.  Shaqueta Casady B WHNP-BC, 10:45 AM 11/21/2023

## 2023-11-23 ENCOUNTER — Ambulatory Visit: Payer: Self-pay | Admitting: Radiology

## 2023-11-23 LAB — CYTOLOGY - PAP
Comment: NEGATIVE
Diagnosis: NEGATIVE
Diagnosis: REACTIVE
High risk HPV: NEGATIVE

## 2024-01-03 ENCOUNTER — Telehealth: Admitting: Physician Assistant

## 2024-01-03 DIAGNOSIS — J019 Acute sinusitis, unspecified: Secondary | ICD-10-CM | POA: Diagnosis not present

## 2024-01-03 DIAGNOSIS — B9689 Other specified bacterial agents as the cause of diseases classified elsewhere: Secondary | ICD-10-CM | POA: Diagnosis not present

## 2024-01-03 DIAGNOSIS — R051 Acute cough: Secondary | ICD-10-CM

## 2024-01-03 MED ORDER — PROMETHAZINE-DM 6.25-15 MG/5ML PO SYRP
5.0000 mL | ORAL_SOLUTION | Freq: Four times a day (QID) | ORAL | 0 refills | Status: AC | PRN
  Filled 2024-01-03: qty 100, 5d supply, fill #0

## 2024-01-03 MED ORDER — AMOXICILLIN-POT CLAVULANATE 875-125 MG PO TABS
1.0000 | ORAL_TABLET | Freq: Two times a day (BID) | ORAL | 0 refills | Status: AC
  Filled 2024-01-03: qty 20, 10d supply, fill #0

## 2024-01-03 NOTE — Progress Notes (Signed)
 " Virtual Visit Consent   Jennifer Barr, you are scheduled for a virtual visit with a [facility] provider today. Just as with appointments in the office, your consent must be obtained to participate. Your consent will be active for this visit and any virtual visit you may have with one of our providers in the next 365 days. If you have a MyChart account, a copy of this consent can be sent to you electronically.  As this is a virtual visit, video technology does not allow for your provider to perform a traditional examination. This may limit your provider's ability to fully assess your condition. If your provider identifies any concerns that need to be evaluated in person or the need to arrange testing (such as labs, EKG, etc.), we will make arrangements to do so. Although advances in technology are sophisticated, we cannot ensure that it will always work on either your end or our end. If the connection with a video visit is poor, the visit may have to be switched to a telephone visit. With either a video or telephone visit, we are not always able to ensure that we have a secure connection.  By engaging in this virtual visit, you consent to the provision of healthcare and authorize for your insurance to be billed (if applicable) for the services provided during this visit. Depending on your insurance coverage, you may receive a charge related to this service.  I need to obtain your verbal consent now. Are you willing to proceed with your visit today? Becka Lagasse Hanko has provided verbal consent on 01/12/2024 for a virtual visit (video or telephone). Walda Hertzog, PA-C  Date: 01/12/2024 12:27 PM   Virtual Visit via Video Note   I, Jennifer Barr, connected with  Jennifer Barr  ([id number], [birth year]) on 01/12/2024 at  3:15 PM EST by a video-enabled telemedicine application and verified that I am speaking with the correct person using two identifiers.  Location: Patient: Virtual Visit Location Patient:  Home Provider: Virtual Visit Location Provider: Home Office   I discussed the limitations of evaluation and management by telemedicine and the availability of in person appointments. The patient expressed understanding and agreed to proceed.    History of Present Illness: Jennifer Barr is a 68 y.o. who identifies as a female who was assigned female at birth, and is being seen today for cough and congestion for 10 days.  HPI: Patient presents for evaluation of sinus congestion going on for the last 10 days.  Reports worsening sinus pressure, pain with a cough.  She denies any ear pain, sore throat, chest pain, shortness of breath.  She has tried over-the-counter cold medicine without any significant relief.    Problems:  Patient Active Problem List   Diagnosis Date Noted   Cold sore 03/23/2022   Peripheral neuropathy 12/14/2020   Acute parotitis 12/03/2017   ANA positive 02/10/2016   Rheumatoid factor positive 02/10/2016   Primary osteoarthritis, right hand 02/10/2016   Scoliosis 02/10/2016   Raynaud's syndrome without gangrene 02/10/2016   Sjogren's disease 05/07/2014   Vasculitis 05/07/2014   Hashimoto's thyroiditis 05/07/2014    Allergies: Allergies[1] Medications: Current Medications[2]  Observations/Objective: Patient is well-developed, well-nourished in no acute distress.  Resting comfortably  at home.  Head is normocephalic, atraumatic.  No labored breathing.  No ear pain with manipulation of the ears bilaterally Tenderness to palpation per patient assessment over the sinuses No neck lymphadenopathy reported Speech is clear and coherent with logical content.  Patient is alert and oriented at baseline.    Assessment and Plan: 1. Acute bacterial sinusitis (Primary) - amoxicillin -clavulanate (AUGMENTIN ) 875-125 MG tablet; Take 1 tablet by mouth 2 (two) times daily.  Dispense: 20 tablet; Refill: 0  2. Acute cough - promethazine -dextromethorphan (PROMETHAZINE -DM)  6.25-15 MG/5ML syrup; Take 5 mLs by mouth 4 (four) times daily as needed for up to 5 days for cough.  Dispense: 100 mL; Refill: 0    Follow Up Instructions: I discussed the assessment and treatment plan with the patient. The patient was provided an opportunity to ask questions and all were answered. The patient agreed with the plan and demonstrated an understanding of the instructions.  A copy of instructions were sent to the patient via MyChart unless otherwise noted below.    The patient was advised to call back or seek an in-person evaluation if the symptoms worsen or if the condition fails to improve as anticipated.    Aminta Sakurai, PA-C     [1]  Allergies Allergen Reactions   Sulfa Antibiotics Rash  [2]  Current Outpatient Medications:    amoxicillin -clavulanate (AUGMENTIN ) 875-125 MG tablet, Take 1 tablet by mouth 2 (two) times daily., Disp: 20 tablet, Rfl: 0   acyclovir  ointment (ZOVIRAX ) 5 %, Apply 1 Application topically every 3 (three) hours. (Patient not taking: Reported on 11/21/2023), Disp: 30 g, Rfl: 1   Ascorbic Acid (VITAMIN C PO), Take by mouth daily., Disp: , Rfl:    aspirin 81 MG tablet, Take 81 mg by mouth daily., Disp: , Rfl:    azaTHIOprine  (IMURAN ) 50 MG tablet, Take 1 tablet (50 mg) by mouth daily., Disp: 90 tablet, Rfl: 0   B Complex Vitamins (VITAMIN B COMPLEX PO), Take by mouth daily., Disp: , Rfl:    Calcium 200 MG TABS, Take by mouth., Disp: , Rfl:    LOTEMAX  SM 0.38 % GEL, Place 1 drop into both eyes every evening. (Patient not taking: Reported on 11/21/2023), Disp: 5 g, Rfl: 12   Magnesium 80 MG TABS, Take by mouth., Disp: , Rfl:    Sodium Fluoride  1.1 % PSTE, Brush teeth once daily for 2 minutes, preferably at bedtime., Disp: 100 mL, Rfl: 4   SYNTHROID  75 MCG tablet, Take 1 tablet (75 mcg total) by mouth daily before breakfast., Disp: 90 tablet, Rfl: 3   VITAMIN D  PO, Take by mouth daily., Disp: , Rfl:    VITAMIN E PO, Take by mouth., Disp: , Rfl:   "

## 2024-01-03 NOTE — Patient Instructions (Signed)
 " Sharman RAMAN Egler, thank you for joining Sanya Kobrin, PA-C for today's virtual visit.  While this provider is not your primary care provider (PCP), if your PCP is located in our provider database this encounter information will be shared with them immediately following your visit.   A [facility] MyChart account gives you access to today's visit and all your visits, tests, and labs performed at [facility]  click here if you don't have a [facility] MyChart account or go to [email or web address]  Consent: (Patient) Jennifer Barr provided verbal consent for this virtual visit at the beginning of the encounter.  Current Medications:  Current Outpatient Medications:    amoxicillin -clavulanate (AUGMENTIN ) 875-125 MG tablet, Take 1 tablet by mouth 2 (two) times daily., Disp: 20 tablet, Rfl: 0   promethazine -dextromethorphan (PROMETHAZINE -DM) 6.25-15 MG/5ML syrup, Take 5 mLs by mouth 4 (four) times daily as needed for up to 5 days for cough., Disp: 100 mL, Rfl: 0   acyclovir  ointment (ZOVIRAX ) 5 %, Apply 1 Application topically every 3 (three) hours. (Patient not taking: Reported on 11/21/2023), Disp: 30 g, Rfl: 1   Ascorbic Acid (VITAMIN C PO), Take by mouth daily., Disp: , Rfl:    aspirin 81 MG tablet, Take 81 mg by mouth daily., Disp: , Rfl:    azaTHIOprine  (IMURAN ) 50 MG tablet, Take 1 tablet (50 mg) by mouth daily., Disp: 90 tablet, Rfl: 0   B Complex Vitamins (VITAMIN B COMPLEX PO), Take by mouth daily., Disp: , Rfl:    Calcium 200 MG TABS, Take by mouth., Disp: , Rfl:    LOTEMAX  SM 0.38 % GEL, Place 1 drop into both eyes every evening. (Patient not taking: Reported on 11/21/2023), Disp: 5 g, Rfl: 12   Magnesium 80 MG TABS, Take by mouth., Disp: , Rfl:    Sodium Fluoride  1.1 % PSTE, Brush teeth once daily for 2 minutes, preferably at bedtime., Disp: 100 mL, Rfl: 4   SYNTHROID  75 MCG tablet, Take 1 tablet (75 mcg total) by mouth daily before breakfast., Disp: 90 tablet, Rfl: 3    VITAMIN D  PO, Take by mouth daily., Disp: , Rfl:    VITAMIN E PO, Take by mouth., Disp: , Rfl:    Medications ordered in this encounter:  Meds ordered this encounter  Medications   amoxicillin -clavulanate (AUGMENTIN ) 875-125 MG tablet    Sig: Take 1 tablet by mouth 2 (two) times daily.    Dispense:  20 tablet    Refill:  0    Supervising Provider:   LAMPTEY, PHILIP O [[id number]]   promethazine -dextromethorphan (PROMETHAZINE -DM) 6.25-15 MG/5ML syrup    Sig: Take 5 mLs by mouth 4 (four) times daily as needed for up to 5 days for cough.    Dispense:  100 mL    Refill:  0    Supervising Provider:   BLAISE ALEENE KIDD [[id number]]     *If you need refills on other medications prior to your next appointment, please contact your pharmacy*  Follow-Up: Call back or seek an in-person evaluation if the symptoms worsen or if the condition fails to improve as anticipated.  [facility] [phone]  Other Instructions   If you have been instructed to have an in-person evaluation today at a local Urgent Care facility, please use the link below. It will take you to a list of all of our available [facility], including address, phone number and hours of operation. Please do not delay care.  [facility]  If you or a family member do not have a primary care provider, use the link below to schedule a visit and establish care. When you choose a [facility] primary care physician or advanced practice provider, you gain a long-term partner in health. Find a Primary Care Provider  Learn more about [facility] in-office and virtual care options: [facility] Get Care Now  "

## 2024-01-04 ENCOUNTER — Other Ambulatory Visit (HOSPITAL_BASED_OUTPATIENT_CLINIC_OR_DEPARTMENT_OTHER): Payer: Self-pay

## 2024-01-16 ENCOUNTER — Other Ambulatory Visit: Payer: Self-pay | Admitting: *Deleted

## 2024-01-16 DIAGNOSIS — Z79899 Other long term (current) drug therapy: Secondary | ICD-10-CM

## 2024-01-17 ENCOUNTER — Ambulatory Visit: Payer: Self-pay | Admitting: Rheumatology

## 2024-01-17 LAB — COMPREHENSIVE METABOLIC PANEL WITH GFR
AG Ratio: 1.1 (calc) (ref 1.0–2.5)
ALT: 9 U/L (ref 6–29)
AST: 36 U/L — ABNORMAL HIGH (ref 10–35)
Albumin: 4.2 g/dL (ref 3.6–5.1)
Alkaline phosphatase (APISO): 89 U/L (ref 37–153)
BUN: 13 mg/dL (ref 7–25)
CO2: 27 mmol/L (ref 20–32)
Calcium: 9.4 mg/dL (ref 8.6–10.4)
Chloride: 101 mmol/L (ref 98–110)
Creat: 0.82 mg/dL (ref 0.50–1.05)
Globulin: 3.8 g/dL — ABNORMAL HIGH (ref 1.9–3.7)
Glucose, Bld: 88 mg/dL (ref 65–99)
Potassium: 4.5 mmol/L (ref 3.5–5.3)
Sodium: 135 mmol/L (ref 135–146)
Total Bilirubin: 0.4 mg/dL (ref 0.2–1.2)
Total Protein: 8 g/dL (ref 6.1–8.1)
eGFR: 78 mL/min/1.73m2

## 2024-01-17 LAB — CBC WITH DIFFERENTIAL/PLATELET
Absolute Lymphocytes: 731 {cells}/uL — ABNORMAL LOW (ref 850–3900)
Absolute Monocytes: 286 {cells}/uL (ref 200–950)
Basophils Absolute: 20 {cells}/uL (ref 0–200)
Basophils Relative: 0.6 %
Eosinophils Absolute: 20 {cells}/uL (ref 15–500)
Eosinophils Relative: 0.6 %
HCT: 42 % (ref 35.9–46.0)
Hemoglobin: 13.7 g/dL (ref 11.7–15.5)
MCH: 28.5 pg (ref 27.0–33.0)
MCHC: 32.6 g/dL (ref 31.6–35.4)
MCV: 87.3 fL (ref 81.4–101.7)
MPV: 9.1 fL (ref 7.5–12.5)
Monocytes Relative: 8.4 %
Neutro Abs: 2343 {cells}/uL (ref 1500–7800)
Neutrophils Relative %: 68.9 %
Platelets: 420 Thousand/uL — ABNORMAL HIGH (ref 140–400)
RBC: 4.81 Million/uL (ref 3.80–5.10)
RDW: 12.5 % (ref 11.0–15.0)
Total Lymphocyte: 21.5 %
WBC: 3.4 Thousand/uL — ABNORMAL LOW (ref 3.8–10.8)

## 2024-01-17 NOTE — Progress Notes (Signed)
 White cell count is low and stable.  Lymphocyte count is low due to immunosuppression.  Liver functions are mildly elevated.  Patient should avoid all NSAIDs and alcohol use.  Please forward results to her PCP.

## 2024-01-17 NOTE — Progress Notes (Signed)
 "  Office Visit Note  Patient: Jennifer Barr             Date of Birth: 1955-02-02           MRN: [id number]             PCP: Kennyth Worth HERO, MD Referring: Kennyth Worth HERO, MD Visit Date: 01/31/2024 Occupation: Data Unavailable  Subjective:  Medication management  History of Present Illness: Jennifer Barr is a 69 y.o. female with Sjogren's, vasculitis and osteoarthritis.  She returns today after her last visit in July 2025.  In summer she developed an upper respiratory tract infection for which she was given an antibiotic.  She states her symptoms gradually resolved.  She has not had a flare of vasculitis.  She continues to have dry mouth and dry eye symptoms.  She continues to have Raynaud's symptoms.  She denies any joint pain or joint swelling.  She denies morning stiffness.  She continues to be on Imuran  50 mg daily.  She has been off Plaquenil  for a while.  She denies any discomfort in her hands today.    Activities of Daily Living:  Patient reports morning stiffness for 0  minute.   Patient Denies nocturnal pain.  Difficulty dressing/grooming: Denies Difficulty climbing stairs: Denies Difficulty getting out of chair: Denies Difficulty using hands for taps, buttons, cutlery, and/or writing: Denies  Review of Systems  Constitutional:  Negative for fatigue.  HENT:  Positive for mouth dryness. Negative for mouth sores.   Eyes:  Positive for dryness.  Respiratory:  Negative for shortness of breath.   Cardiovascular:  Negative for chest pain and palpitations.  Gastrointestinal:  Negative for blood in stool, constipation and diarrhea.  Endocrine: Negative for increased urination.  Genitourinary:  Negative for decreased urine output.  Musculoskeletal:  Negative for joint pain, joint pain, myalgias, morning stiffness and myalgias.  Skin:  Positive for color change. Negative for rash and sensitivity to sunlight.  Allergic/Immunologic: Negative for susceptible to infections.   Neurological:  Negative for dizziness and headaches.  Hematological:  Negative for swollen glands.  Psychiatric/Behavioral:  Negative for depressed mood and sleep disturbance. The patient is not nervous/anxious.     PMFS History:  Patient Active Problem List   Diagnosis Date Noted   Cold sore 03/23/2022   Peripheral neuropathy 12/14/2020   Acute parotitis 12/03/2017   ANA positive 02/10/2016   Rheumatoid factor positive 02/10/2016   Primary osteoarthritis, right hand 02/10/2016   Scoliosis 02/10/2016   Raynaud's syndrome without gangrene 02/10/2016   Sjogren's disease 05/07/2014   Vasculitis 05/07/2014   Hashimoto's thyroiditis 05/07/2014    Past Medical History:  Diagnosis Date   HSV infection    Osteopenia 07/2017   T score -1.3 FRAX 7.2% / 0.6%   Premature ovarian failure    Sjoegren syndrome    Dr. Dolphus   Thyroid  disease    Hashiomto   Vasculitis    Dr. Dolphus    Family History  Problem Relation Age of Onset   Diabetes Mother    Diabetes Father    Scleroderma Daughter    Breast cancer Neg Hx    Past Surgical History:  Procedure Laterality Date   TUBAL LIGATION     Social History[1] Social History   Social History Narrative   Not on file     Immunization History  Administered Date(s) Administered   Fluad Quad(high Dose 65+) 10/19/2021   Fluad Trivalent(High Dose 65+) 10/11/2022   INFLUENZA, HIGH  DOSE SEASONAL PF 10/14/2018   Influenza Inj Mdck Quad Pf 10/25/2020   Influenza,inj,Quad PF,6+ Mos 10/14/2018   Influenza-Unspecified 10/25/2020   Moderna Sars-Covid-2 Vaccination 03/25/2019, 04/22/2019, 09/01/2019   PNEUMOCOCCAL CONJUGATE-20 10/19/2021   Pfizer(Comirnaty )Fall Seasonal Vaccine 12 years and older 10/11/2022   Tdap 06/06/2018   Zoster Recombinant(Shingrix) 10/25/2020, 03/01/2021     Objective: Vital Signs: BP 108/73   Pulse 75   Temp 98.1 F (36.7 C)   Resp 13   Ht 5' 8 (1.727 m)   Wt 149 lb 9.6 oz (67.9 kg)   BMI 22.75  kg/m    Physical Exam Vitals and nursing note reviewed.  Constitutional:      Appearance: She is well-developed.  HENT:     Head: Normocephalic and atraumatic.  Eyes:     Conjunctiva/sclera: Conjunctivae normal.  Cardiovascular:     Rate and Rhythm: Normal rate and regular rhythm.     Heart sounds: Normal heart sounds.  Pulmonary:     Effort: Pulmonary effort is normal.     Breath sounds: Normal breath sounds.  Abdominal:     General: Bowel sounds are normal.     Palpations: Abdomen is soft.  Musculoskeletal:     Cervical back: Normal range of motion.  Lymphadenopathy:     Cervical: No cervical adenopathy.  Skin:    General: Skin is warm and dry.     Capillary Refill: Capillary refill takes less than 2 seconds.  Neurological:     Mental Status: She is alert and oriented to person, place, and time.  Psychiatric:        Behavior: Behavior normal.      Musculoskeletal Exam: Cervical, thoracic and lumbar spine were in good range of motion.  There was no SI joint tenderness.  Shoulder joints, elbow joints, wrist joints, MCPs, PIPs and DIPs were in good range of motion with no synovitis.  Hip joints and knee joints were in good range of motion without any warmth swelling or effusion.  There was no tenderness over ankles or MTPs.   CDAI Exam: CDAI Score: -- Patient Global: --; Provider Global: -- Swollen: --; Tender: -- Joint Exam 01/31/2024   No joint exam has been documented for this visit   There is currently no information documented on the homunculus. Go to the Rheumatology activity and complete the homunculus joint exam.  Investigation: No additional findings.  Imaging: No results found.  Recent Labs: Lab Results  Component Value Date   WBC 3.4 (L) 01/16/2024   HGB 13.7 01/16/2024   PLT 420 (H) 01/16/2024   NA 135 01/16/2024   K 4.5 01/16/2024   CL 101 01/16/2024   CO2 27 01/16/2024   GLUCOSE 88 01/16/2024   BUN 13 01/16/2024   CREATININE 0.82 01/16/2024    BILITOT 0.4 01/16/2024   ALKPHOS 81 09/01/2019   AST 36 (H) 01/16/2024   ALT 9 01/16/2024   PROT 8.0 01/16/2024   ALBUMIN 4.3 09/01/2019   CALCIUM 9.4 01/16/2024   GFRAA 79 06/17/2020    Speciality Comments: PLQ Eye Exam: 04/22/2019 WNL @ [facility], OD PA  Procedures:  No procedures performed Allergies: Sulfa antibiotics   Assessment / Plan:     Visit Diagnoses: Sjogren's syndrome with other organ involvement - Positive ANA, positive SSA, positive SSB, positive RF (later negative), severe sicca symptoms: Patient continues to have dry mouth and dry eye symptoms.  She has tried pilocarpine  in the past which did not help.  She has been taking Imuran   on a regular basis.  She denies palpitations or shortness of breath.  Increased risk of ILD, inflammatory arthritis, and lymphoma associated with Sjogren's was discussed.  No parotid swelling or lymphadenopathy was noted.  Lungs were clear to auscultation.  High risk medication use - Imuran  50 mg 1 tablet p.o. daily (Plaquenil  discontinued due to lymphopenia).  January 16, 2024 white cell count 3.4, platelets  420, AST was 36.  Lab results were reviewed with the patient.  Patient states she was on antibiotics for upper respiratory tract infection.  She was advised to get labs every 3 months to monitor for drug toxicity.  Information reimmunization was placed in the AVS.  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  She has not had any recent flares of vasculitis.  No rash was noted on the examination.  Raynaud's syndrome without gangrene-he continues to have mild Raynaud's symptoms.  Keeping core temperature warm and warm clothing was discussed.  Primary osteoarthritis of both hands-she denies any stiffness in her hands.  Scoliosis  Osteopenia of multiple sites - September 28, 2021 T-score -1.5 AP spine.  DEXA followed by GYN.  Repeat DEXA is pending.  History of hypothyroidism  Orders: No orders of the defined  types were placed in this encounter.  No orders of the defined types were placed in this encounter.    Follow-Up Instructions: Return in about 5 months (around 06/30/2024) for Sjogren's, vascular.   Maya Nash, MD  Note - This record has been created using Animal nutritionist.  Chart creation errors have been sought, but may not always  have been located. Such creation errors do not reflect on  the standard of medical care.     [1]  Social History Tobacco Use   Smoking status: Never    Passive exposure: Never   Smokeless tobacco: Never  Vaping Use   Vaping status: Never Used  Substance Use Topics   Alcohol use: Not Currently   Drug use: Never   "

## 2024-01-31 ENCOUNTER — Encounter: Payer: Self-pay | Admitting: Rheumatology

## 2024-01-31 ENCOUNTER — Ambulatory Visit: Admitting: Rheumatology

## 2024-01-31 VITALS — BP 108/73 | HR 75 | Temp 98.1°F | Resp 13 | Ht 68.0 in | Wt 149.6 lb

## 2024-01-31 DIAGNOSIS — M19041 Primary osteoarthritis, right hand: Secondary | ICD-10-CM | POA: Diagnosis not present

## 2024-01-31 DIAGNOSIS — Z79899 Other long term (current) drug therapy: Secondary | ICD-10-CM

## 2024-01-31 DIAGNOSIS — Z8639 Personal history of other endocrine, nutritional and metabolic disease: Secondary | ICD-10-CM

## 2024-01-31 DIAGNOSIS — I776 Arteritis, unspecified: Secondary | ICD-10-CM | POA: Diagnosis not present

## 2024-01-31 DIAGNOSIS — I7300 Raynaud's syndrome without gangrene: Secondary | ICD-10-CM | POA: Diagnosis not present

## 2024-01-31 DIAGNOSIS — M3509 Sicca syndrome with other organ involvement: Secondary | ICD-10-CM

## 2024-01-31 DIAGNOSIS — M419 Scoliosis, unspecified: Secondary | ICD-10-CM | POA: Diagnosis not present

## 2024-01-31 DIAGNOSIS — D72810 Lymphocytopenia: Secondary | ICD-10-CM

## 2024-01-31 DIAGNOSIS — M8589 Other specified disorders of bone density and structure, multiple sites: Secondary | ICD-10-CM | POA: Diagnosis not present

## 2024-01-31 DIAGNOSIS — M19042 Primary osteoarthritis, left hand: Secondary | ICD-10-CM

## 2024-01-31 NOTE — Patient Instructions (Signed)
 Standing Labs We placed an order today for your standing lab work.   Please have your standing labs drawn in April and every 3 months  Please have your labs drawn 2 weeks prior to your appointment so that the provider can discuss your lab results at your appointment, if possible.  Please note that you may see your imaging and lab results in MyChart before we have reviewed them. We will contact you once all results are reviewed. Please allow our office up to 72 hours to thoroughly review all of the results before contacting the office for clarification of your results.  WALK-IN LAB HOURS  Monday through Thursday from 8:00 am - 4:30 pm and Friday from 8:00 am-12:00 pm.  Patients with office visits requiring labs will be seen before walk-in labs.  You may encounter longer than normal wait times. Please allow additional time. Wait times may be shorter on  Monday and Thursday afternoons.  We do not book appointments for walk-in labs. We appreciate your patience and understanding with our staff.   Labs are drawn by [facility]. Please bring your co-pay at the time of your lab draw.  You may receive a bill from [facility] for your lab work.  Please note if you are on Hydroxychloroquine and and an order has been placed for a Hydroxychloroquine level,  you will need to have it drawn 4 hours or more after your last dose.  If you wish to have your labs drawn at another location, please call the office 24 hours in advance so we can fax the orders.  The office is located at [address], [place], [place] [address]   If you have any questions regarding directions or hours of operation,  please call [phone].   As a reminder, please drink plenty of water prior to coming for your lab work. Thanks!   Vaccines You are taking a medication(s) that can suppress your immune system.  The following immunizations are recommended: Flu annually Covid-19  RSV Td/Tdap (tetanus, diphtheria, pertussis)  every 10 years Pneumonia (Prevnar 15 then Pneumovax 23 at least 1 year apart.  Alternatively, can take Prevnar 20 without needing additional dose) Shingrix: 2 doses from 4 weeks to 6 months apart  Please check with your PCP to make sure you are up to date.

## 2024-06-27 ENCOUNTER — Encounter: Admitting: Family Medicine

## 2024-07-31 ENCOUNTER — Ambulatory Visit: Admitting: Rheumatology
